# Patient Record
Sex: Female | Born: 1970 | Race: Black or African American | Hispanic: No | Marital: Single
Health system: Southern US, Academic
[De-identification: ages and names within clinical notes are randomized; demographics above are authoritative.]

## PROBLEM LIST (undated history)

## (undated) ENCOUNTER — Telehealth

## (undated) ENCOUNTER — Ambulatory Visit
Payer: MEDICARE | Attending: Student in an Organized Health Care Education/Training Program | Primary: Student in an Organized Health Care Education/Training Program

## (undated) ENCOUNTER — Encounter: Attending: Hematology & Oncology | Primary: Hematology & Oncology

## (undated) ENCOUNTER — Telehealth: Attending: Diagnostic Radiology | Primary: Diagnostic Radiology

## (undated) ENCOUNTER — Telehealth: Attending: Hematology & Oncology | Primary: Hematology & Oncology

## (undated) ENCOUNTER — Ambulatory Visit

## (undated) ENCOUNTER — Encounter
Attending: Student in an Organized Health Care Education/Training Program | Primary: Student in an Organized Health Care Education/Training Program

## (undated) ENCOUNTER — Encounter: Attending: Internal Medicine | Primary: Internal Medicine

## (undated) ENCOUNTER — Inpatient Hospital Stay: Payer: MEDICARE

## (undated) ENCOUNTER — Ambulatory Visit: Payer: BLUE CROSS/BLUE SHIELD | Attending: Diagnostic Radiology | Primary: Diagnostic Radiology

## (undated) ENCOUNTER — Encounter

## (undated) ENCOUNTER — Ambulatory Visit: Payer: BLUE CROSS/BLUE SHIELD | Attending: Hematology & Oncology | Primary: Hematology & Oncology

## (undated) ENCOUNTER — Ambulatory Visit: Payer: MEDICARE

## (undated) ENCOUNTER — Encounter: Payer: BLUE CROSS/BLUE SHIELD | Attending: Hematology & Oncology | Primary: Hematology & Oncology

## (undated) ENCOUNTER — Ambulatory Visit: Payer: BLUE CROSS/BLUE SHIELD

## (undated) ENCOUNTER — Telehealth: Attending: Surgery | Primary: Surgery

## (undated) ENCOUNTER — Ambulatory Visit: Attending: Hematology & Oncology | Primary: Hematology & Oncology

## (undated) ENCOUNTER — Encounter: Attending: Oncology | Primary: Oncology

## (undated) ENCOUNTER — Telehealth
Attending: Student in an Organized Health Care Education/Training Program | Primary: Student in an Organized Health Care Education/Training Program

## (undated) ENCOUNTER — Telehealth
Attending: Pharmacist Clinician (PhC)/ Clinical Pharmacy Specialist | Primary: Pharmacist Clinician (PhC)/ Clinical Pharmacy Specialist

## (undated) ENCOUNTER — Ambulatory Visit: Payer: MEDICARE | Attending: Surgery | Primary: Surgery

## (undated) DIAGNOSIS — I10 Essential (primary) hypertension: Secondary | ICD-10-CM

## (undated) DIAGNOSIS — M1990 Unspecified osteoarthritis, unspecified site: Secondary | ICD-10-CM

## (undated) HISTORY — DX: Essential (primary) hypertension: I10

## (undated) HISTORY — PX: APPENDECTOMY: SHX54

## (undated) HISTORY — DX: Unspecified osteoarthritis, unspecified site: M19.90

## (undated) HISTORY — PX: REPLACEMENT TOTAL KNEE: SUR1224

## (undated) MED ORDER — OXYCODONE 5 MG TABLET: tablet | 0 refills | 0 days | Status: CN

## (undated) MED ORDER — POTASSIUM GLUCONATE 2.5 MEQ TABLET: ORAL | 0 days

---
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
## 2011-07-13 ENCOUNTER — Other Ambulatory Visit (HOSPITAL_COMMUNITY): Payer: Self-pay | Admitting: Family Medicine

## 2011-07-13 DIAGNOSIS — R2230 Localized swelling, mass and lump, unspecified upper limb: Secondary | ICD-10-CM

## 2011-07-21 ENCOUNTER — Ambulatory Visit (HOSPITAL_COMMUNITY)
Admission: RE | Admit: 2011-07-21 | Discharge: 2011-07-21 | Disposition: A | Discharge status: Home / Self Care | Payer: Medicaid Other | Source: Ambulatory Visit | Attending: Family Medicine | Admitting: Family Medicine

## 2011-07-21 ENCOUNTER — Other Ambulatory Visit (HOSPITAL_COMMUNITY): Payer: Self-pay | Admitting: Family Medicine

## 2011-07-21 DIAGNOSIS — N63 Unspecified lump in unspecified breast: Secondary | ICD-10-CM | POA: Insufficient documentation

## 2012-11-27 ENCOUNTER — Other Ambulatory Visit (HOSPITAL_COMMUNITY): Payer: Self-pay | Admitting: Nurse Practitioner

## 2012-11-27 DIAGNOSIS — Z139 Encounter for screening, unspecified: Secondary | ICD-10-CM

## 2012-11-28 ENCOUNTER — Ambulatory Visit (HOSPITAL_COMMUNITY): Payer: Medicaid Other

## 2012-11-30 ENCOUNTER — Ambulatory Visit (HOSPITAL_COMMUNITY): Payer: Medicaid Other

## 2012-12-01 ENCOUNTER — Ambulatory Visit (HOSPITAL_COMMUNITY)
Admission: RE | Admit: 2012-12-01 | Discharge: 2012-12-01 | Disposition: A | Discharge status: Home / Self Care | Payer: Medicaid Other | Source: Ambulatory Visit | Attending: Family Medicine | Admitting: Family Medicine

## 2012-12-01 DIAGNOSIS — Z1231 Encounter for screening mammogram for malignant neoplasm of breast: Secondary | ICD-10-CM | POA: Insufficient documentation

## 2012-12-01 DIAGNOSIS — Z139 Encounter for screening, unspecified: Secondary | ICD-10-CM

## 2014-02-26 ENCOUNTER — Other Ambulatory Visit (HOSPITAL_COMMUNITY): Payer: Self-pay | Admitting: Nurse Practitioner

## 2014-02-26 DIAGNOSIS — Z1231 Encounter for screening mammogram for malignant neoplasm of breast: Secondary | ICD-10-CM

## 2014-03-07 ENCOUNTER — Ambulatory Visit (HOSPITAL_COMMUNITY): Payer: Medicaid Other

## 2014-03-08 ENCOUNTER — Ambulatory Visit (HOSPITAL_COMMUNITY)
Admission: RE | Admit: 2014-03-08 | Discharge: 2014-03-08 | Disposition: A | Discharge status: Home / Self Care | Payer: Medicaid Other | Source: Ambulatory Visit | Attending: Nurse Practitioner | Admitting: Nurse Practitioner

## 2014-03-08 DIAGNOSIS — Z1231 Encounter for screening mammogram for malignant neoplasm of breast: Secondary | ICD-10-CM | POA: Insufficient documentation

## 2015-02-28 ENCOUNTER — Other Ambulatory Visit (HOSPITAL_COMMUNITY): Payer: Self-pay | Admitting: Nurse Practitioner

## 2015-02-28 DIAGNOSIS — Z1231 Encounter for screening mammogram for malignant neoplasm of breast: Secondary | ICD-10-CM

## 2015-03-20 ENCOUNTER — Ambulatory Visit (HOSPITAL_COMMUNITY)
Admission: RE | Admit: 2015-03-20 | Discharge: 2015-03-20 | Disposition: A | Discharge status: Home / Self Care | Payer: Medicaid Other | Source: Ambulatory Visit | Attending: Nurse Practitioner | Admitting: Nurse Practitioner

## 2015-03-20 DIAGNOSIS — Z1231 Encounter for screening mammogram for malignant neoplasm of breast: Secondary | ICD-10-CM

## 2016-04-27 ENCOUNTER — Other Ambulatory Visit (HOSPITAL_COMMUNITY): Payer: Self-pay | Admitting: Nurse Practitioner

## 2016-04-27 DIAGNOSIS — Z1231 Encounter for screening mammogram for malignant neoplasm of breast: Secondary | ICD-10-CM

## 2016-05-06 ENCOUNTER — Encounter (HOSPITAL_COMMUNITY): Payer: Self-pay

## 2016-05-07 ENCOUNTER — Other Ambulatory Visit (HOSPITAL_COMMUNITY): Payer: Self-pay | Admitting: Nurse Practitioner

## 2016-05-07 DIAGNOSIS — Z1231 Encounter for screening mammogram for malignant neoplasm of breast: Secondary | ICD-10-CM

## 2016-05-13 ENCOUNTER — Ambulatory Visit (HOSPITAL_COMMUNITY)
Admission: RE | Admit: 2016-05-13 | Discharge: 2016-05-13 | Disposition: A | Discharge status: Home / Self Care | Payer: Medicaid Other | Source: Ambulatory Visit | Attending: Nurse Practitioner | Admitting: Nurse Practitioner

## 2016-05-13 DIAGNOSIS — Z1231 Encounter for screening mammogram for malignant neoplasm of breast: Secondary | ICD-10-CM

## 2016-06-25 ENCOUNTER — Encounter: Payer: Self-pay | Admitting: *Deleted

## 2016-06-25 NOTE — Progress Notes (Signed)
Dr. Truett PernaSherrill spoke with Dr. Edilia Boobert Esther regarding seeing pt and Dr. Judeth HornEsther's office notified of pt demographics.  Appt set for 06/30/16 at 12:45PM.  Pt notified of date and time of appt.  Pt appreciative of call and informed that Dr. Judeth HornEsther's is to contact her on Monday for further instructions regarding her appt with them.

## 2017-05-10 ENCOUNTER — Ambulatory Visit
Admission: RE | Admit: 2017-05-10 | Discharge: 2017-05-10 | Disposition: A | Discharge status: Home / Self Care | Payer: MEDICAID

## 2017-05-10 ENCOUNTER — Ambulatory Visit
Admission: RE | Admit: 2017-05-10 | Discharge: 2017-05-10 | Disposition: A | Discharge status: Home / Self Care | Payer: MEDICAID | Attending: Hematology & Oncology | Admitting: Hematology & Oncology

## 2017-05-10 DIAGNOSIS — D481 Neoplasm of uncertain behavior of connective and other soft tissue: Principal | ICD-10-CM

## 2017-05-10 DIAGNOSIS — R21 Rash and other nonspecific skin eruption: Secondary | ICD-10-CM

## 2017-05-10 MED ORDER — OXYCODONE 5 MG TABLET
ORAL_TABLET | 0 refills | 0 days | Status: CP
Start: 2017-05-10 — End: 2017-08-26

## 2017-05-10 MED ORDER — UREA 10 % TOPICAL CREAM
0 refills | 0 days | Status: CP
Start: 2017-05-10 — End: 2017-12-27

## 2017-08-26 MED ORDER — OXYCODONE 5 MG TABLET
ORAL_TABLET | 0 refills | 0 days | Status: CP
Start: 2017-08-26 — End: 2017-09-26

## 2017-09-02 MED ORDER — SORAFENIB 200 MG TABLET: 200 mg | tablet | Freq: Every day | 11 refills | 0 days | Status: AC

## 2017-09-02 MED ORDER — SORAFENIB 200 MG TABLET
ORAL_TABLET | Freq: Every day | ORAL | 11 refills | 0.00000 days | Status: CP
Start: 2017-09-02 — End: 2017-09-02

## 2017-09-02 NOTE — Unmapped (Signed)
Escribed sorafenib to [facility] pharmacy to renew benefits investigation. I received a fax from the [employer] program that the patient needed to reapply. Per referrals tab her assistance expired 08/30/17.No active prescription insurance on file at this time. Asked MAPS team to help facilitate renewal (sent email to [facility]).     Leotis Shames, PharmD, BCOP, CPP  Pager [phone]

## 2017-09-26 ENCOUNTER — Ambulatory Visit: Admission: RE | Admit: 2017-09-26 | Discharge: 2017-09-26 | Disposition: A | Discharge status: Home / Self Care

## 2017-09-26 ENCOUNTER — Ambulatory Visit
Admission: RE | Admit: 2017-09-26 | Discharge: 2017-09-26 | Disposition: A | Discharge status: Home / Self Care | Attending: Hematology & Oncology | Admitting: Hematology & Oncology

## 2017-09-26 DIAGNOSIS — G8929 Other chronic pain: Secondary | ICD-10-CM

## 2017-09-26 DIAGNOSIS — M25512 Pain in left shoulder: Secondary | ICD-10-CM

## 2017-09-26 DIAGNOSIS — R21 Rash and other nonspecific skin eruption: Secondary | ICD-10-CM

## 2017-09-26 DIAGNOSIS — D481 Neoplasm of uncertain behavior of connective and other soft tissue: Principal | ICD-10-CM

## 2017-09-26 DIAGNOSIS — R5383 Other fatigue: Secondary | ICD-10-CM

## 2017-09-26 LAB — CBC W/ AUTO DIFF
BASOPHILS ABSOLUTE COUNT: 0 10*9/L (ref 0.0–0.1)
EOSINOPHILS ABSOLUTE COUNT: 0.1 10*9/L (ref 0.0–0.4)
HEMATOCRIT: 37.4 % (ref 36.0–46.0)
HEMOGLOBIN: 12 g/dL (ref 12.0–16.0)
LARGE UNSTAINED CELLS: 1 % (ref 0–4)
MEAN CORPUSCULAR HEMOGLOBIN CONC: 32 g/dL (ref 31.0–37.0)
MEAN CORPUSCULAR VOLUME: 82.5 fL (ref 80.0–100.0)
MEAN PLATELET VOLUME: 9.1 fL (ref 7.0–10.0)
MONOCYTES ABSOLUTE COUNT: 0.3 10*9/L (ref 0.2–0.8)
NEUTROPHILS ABSOLUTE COUNT: 4.1 10*9/L (ref 2.0–7.5)
PLATELET COUNT: 297 10*9/L (ref 150–440)
RED BLOOD CELL COUNT: 4.54 10*12/L (ref 4.00–5.20)
RED CELL DISTRIBUTION WIDTH: 14.9 % (ref 12.0–15.0)
WBC ADJUSTED: 6.9 10*9/L (ref 4.5–11.0)

## 2017-09-26 LAB — BUN / CREAT RATIO: Urea nitrogen/Creatinine:MRto:Pt:Ser/Plas:Qn:: 26

## 2017-09-26 LAB — COMPREHENSIVE METABOLIC PANEL
ALBUMIN: 4 g/dL (ref 3.5–5.0)
ALKALINE PHOSPHATASE: 81 U/L (ref 38–126)
ANION GAP: 14 mmol/L (ref 9–15)
BILIRUBIN TOTAL: 0.6 mg/dL (ref 0.0–1.2)
BLOOD UREA NITROGEN: 16 mg/dL (ref 7–21)
BUN / CREAT RATIO: 26
CALCIUM: 8.7 mg/dL (ref 8.5–10.2)
CHLORIDE: 101 mmol/L (ref 98–107)
CO2: 29 mmol/L (ref 22.0–30.0)
CREATININE: 0.61 mg/dL (ref 0.60–1.00)
EGFR MDRD NON AF AMER: >=60 mL/min/{1.73_m2} (ref >=60)
GLUCOSE RANDOM: 90 mg/dL (ref 65–179)
POTASSIUM: 3.8 mmol/L (ref 3.5–5.0)
PROTEIN TOTAL: 7.8 g/dL (ref 6.5–8.3)
SODIUM: 144 mmol/L (ref 135–145)

## 2017-09-26 LAB — MEAN CORPUSCULAR HEMOGLOBIN CONC: Lab: 32

## 2017-09-26 LAB — SMEAR REVIEW

## 2017-09-26 LAB — SLIDE REVIEW

## 2017-09-26 MED ORDER — OXYCODONE 5 MG TABLET
ORAL_TABLET | 0 refills | 0 days | Status: CP
Start: 2017-09-26 — End: 2017-12-07

## 2017-09-26 NOTE — Unmapped (Signed)
SARCOMA ONCOLOGY FOLLOW UP VISIT    Encounter Date: 09/26/2017  Patient Name: Heidi Woodard  Medical Record Number: [id number]    Referring Physician: [facility] Department  [address]  [address]  [place], [place] [address]    Primary Care Provider: [facility] Department    DIAGNOSIS:  1. Desmoid fibromatosis    2. Fatigue, unspecified type    3. Rash of face    4. 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 Continue prn 1% hydrocort cream to face(OTC), triamcinolone cream to body (refilled), sunscreen or avoidance, and to call if she has recurrent issues. PRN benadryl for itching with rash/flaking skin. Emolliants after bathing (cocoa butter ok).  Cornstarch or baby powder to skin folds to minimize chafing.   - Will try urea based lotion such as udderly smooth if needed on callouses on feet and also along neck.    Desmoid tumor associated pain - refill for oxycodone provided #100 dispensed. SHe can take tylenol along with it, but separated in case she has LFT elevation on sorafenib    HFS - resume prn urea 10% cream to apply to pressure callous on right foot below 5th MTP joint if OTC based urea cream not helping. Praised on wearing orthotic shoes. 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 Understands teratogenic risk of pregnancy on sorafenib.    Dispo: RTC 3 mo with labs and wiith MRI in 6 mo (can do labs following visit), sooner if necessary. Reminded to call if she develops recurrent rash.    I personally reviewed the medical records, pathology and laboratory results and viewed the imaging. All questions were answered to the patient's satisfaction and they voiced understanding and agreement with the plan.      REASON FOR CONSULTATION:   Heidi Woodard is a 46 y.o. female who is seen in consultation at the request of Department, Aaron Edelman * for evaluation of her desmoid fibromatosis.      HISTORY OF PRESENT ILLNESS:    About a year ago, pt developed shoulder pain associated with a fall. There was bruising and swelling but she attributed it to the injury. It never improved, and this summer she noted increased swelling and decreased ROM (which is limited by pain, rather than painless weakness). 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 She underwent CT of the left shoudler on 06/25/16 which showed a 16 cm mass involing the left deltoid with likely invasion of the left trapezius, triceps, and teres minor. Possible LN involvement of axillary and supraclav LN on left were present. On 07/07/2016 she saw Dr. Darral Dash and ultrasound-guided biopsy was performed with pathology revealing low-grade spindle cell lesion favoring fibromatosis. He felt that surgical resection would be highly morbid. On 9/20/017, she saw Dr Beckey Rutter, who did not favor RT due to inherent risks associated with RT.     She has ongoing pain, some tingling along the backs of her hands. Pain is intermittent if her arm  is at rest, happening about every other day but when it happens it is very intense. Per above it limits her ADLs although she says she is managing.      Interval history:  -continues on sorafenib 200mg  daily.   - says she is doing ok. Energy fine.  - has 1 foot and no hand callous stable, and one new callous along her left posterior neck that she just noticed. no other skin findings. No rash.    MRI Personally viewed, shows ongoing nice treatment response.      REVIEW OF SYSTEMS:  A comprehensive review of 14 systems was negative except for pertinent positives noted in HPI.    PMH: denies    PSH: denies    History reviewed. No pertinent family history.     Social History     Occupational History   ??? Not on file.     Social History Main Topics   ??? Smoking status: Never Smoker   ??? Smokeless tobacco: Never Used   ??? Alcohol use No   ??? Drug use: No   ??? Sexual activity: Not on file       ALLERGIES/MEDICATIONS:  Reviewed in EPIC    PHYSICAL EXAM:   Vitals: reviewed per chart.   Gen: comfortable, NAD. ECOG PS 1  HEENT: EOMI, sclera anicteric, MMM.   Neck: Supple, trachea midline  Lymph: No cervical, submandibular, or supraclavicular lymphadenopathy   CV: RRR no murmurs   Lungs: CTAB w/o rales, rhochi, or wheezing, good respiratory effort   Abd: +BS, Soft, NT, ND, no masses appreciated   Ext: Warm and well perfused. No cyanosis, clubbing, or edema bilaterally   Skin: no rash. Skin very dry. Minimal pressure callous under 5th MTP joint on right.  Neuro: Moving all extremities, speech fluid. CNII-XII grossly intact.  Sensation intact.  Psych: mood euthymic and affect appropriate   MSK: + left shoulder decreased ROM with inability to ABduct past horizontal due to pain. Swollen, enlarged, but not tender to palpation.  +++++++++++++++++++++++++++++++++++++++++++++++++++++++++++     PATHOLOGY:   Pathology was personally reviewed as described in the HPI, detailed below.   07/07/2016  Surgical pathology exam  Final Diagnosis   A: Shoulder, left, biopsy   - Low grade spindle cell lesion, favor fibromatosis  - The lesional cells are positive for beta-catenin, weakly positive for smooth muscle actin and desmin, and negative for S100, supporting the interpretation of fibromatosis  - Dr. Pollie Friar concurs  Addendum:  Dr. Morrison Old dictating: The last section of the impression should read:     Multiple mildly prominent left supraclavicular and axillary lymph nodes, metastatic disease cannot be excluded.    LABS:    Lab Results   Component Value Date    WBC 6.9 09/26/2017    HGB 12.0 09/26/2017 HCT 37.4 09/26/2017    MCV 82.5 09/26/2017    PLT 297 09/26/2017     Lab Results   Component Value Date    NEUTROABS 4.1 09/26/2017       Chemistry        Component Value Date/Time    NA 144 09/26/2017 1053    K 3.8 09/26/2017 1053    CL 101 09/26/2017 1053    CO2 29.0 09/26/2017 1053    BUN 16 09/26/2017 1053    CREATININE 0.61 09/26/2017 1053    GLU 90 09/26/2017 1053        Component Value Date/Time    CALCIUM 8.7 09/26/2017 1053    ALKPHOS  81 09/26/2017 1053    AST 31 09/26/2017 1053    ALT 31 09/26/2017 1053    BILITOT 0.6 09/26/2017 1053          Lab Results   Component Value Date    ALT 31 09/26/2017    AST 31 09/26/2017    ALKPHOS 81 09/26/2017    BILITOT 0.6 09/26/2017         RADIOLOGY:  Imaging was personally viewed as summarized in epic

## 2017-09-26 NOTE — Unmapped (Signed)
Lab drawn and sent for analysis, care provided by Eppie Gibson RN

## 2017-10-04 NOTE — Unmapped (Signed)
[facility] Triage Note     Patient: Heidi Woodard     Reason for call:  problem with oxycodone rx    Time call returned: 1520     Phone Assessment: Spoke with [facility] pharmacy. They cannot dispense the oxycodone because Medicaid is requiring a prior auth.    I made pt aware that we will work on obtaining prior auth approval.      Triage Recommendations: n/a     Patient Response: n/a     Outstanding tasks: Please obtain prior authorization for oxycodone. Thanks.      Patient Pharmacy has been verified and primary pharmacy has been marked as preferred

## 2017-10-04 NOTE — Unmapped (Signed)
Patient Heidi Woodard called regarding a prescription for oxycodone written by Dr. Loree Fee on 11/26. [facility] pharmacy is refusing to dispense; patient states that they need to speak with Dr. Loree Fee.    Patient's pharmacy can be reached at [phone].    Patient can be reached at [phone].

## 2017-10-05 NOTE — Unmapped (Signed)
Spoke to pt and made her aware. She says she told [facility] that she paid out of pocket, but that they still required to hear from our office first. Pt all set.

## 2017-10-05 NOTE — Unmapped (Signed)
I have called [facility]. As mentioned in a note by me from April 2018 where we were contacted for a prior authorization, the patient has Medicaid family planning, but does not have prescription coverage through Medicaid. She has been paying cash for her oxycodone prescriptions for some time. I called the [facility] pharmacy and asked them to run the prescription for cash. They confirmed an appropriate refill history and that she has been able to afford the medication.     Leotis Shames, PharmD, BCOP, CPP  Pager [phone]

## 2017-11-15 ENCOUNTER — Other Ambulatory Visit (HOSPITAL_COMMUNITY): Payer: Self-pay | Admitting: *Deleted

## 2017-11-15 DIAGNOSIS — Z1231 Encounter for screening mammogram for malignant neoplasm of breast: Secondary | ICD-10-CM

## 2017-11-18 ENCOUNTER — Ambulatory Visit (HOSPITAL_COMMUNITY)
Admission: RE | Admit: 2017-11-18 | Discharge: 2017-11-18 | Disposition: A | Discharge status: Home / Self Care | Payer: PRIVATE HEALTH INSURANCE | Source: Ambulatory Visit | Attending: *Deleted | Admitting: *Deleted

## 2017-11-18 DIAGNOSIS — Z1231 Encounter for screening mammogram for malignant neoplasm of breast: Secondary | ICD-10-CM | POA: Insufficient documentation

## 2017-12-06 NOTE — Unmapped (Signed)
[facility] Triage Note     Patient: Heidi Woodard     Reason for call:  oxycodone refill    Time call returned: 1543     Phone Assessment: Contacted pt regarding oxycodone refill request. Pt reported she ran out of med. She rated pain in left shoulder as 5/10. She is currently taking one Aleve caplet every 4 hours as needed.     Triage Recommendations: Pt was told that her request for oxycodone refill would be forwarded to her provider.     Patient Response: Pt voiced understanding. She is awaiting oxycodone refill.     Outstanding tasks: Please send oxycodone 5 mg refill to [facility] pharmacy in [place].     Patient Pharmacy has been verified and primary pharmacy has been marked as preferred

## 2017-12-06 NOTE — Unmapped (Signed)
Hi,    Patient Heidi Woodard called requesting a medication refill for the following:    ? Medication: Oxycodone  ? Dosage: 5 mg  Days left of medication: 0    Patient???s pharmacy has been updated/verified in the system.        Thank you,  [name]  [facility] Communication [facility]  [phone]

## 2017-12-07 MED ORDER — OXYCODONE 5 MG TABLET
ORAL_TABLET | 0 refills | 0 days | Status: CP
Start: 2017-12-07 — End: 2018-04-28

## 2017-12-23 NOTE — Unmapped (Signed)
PA request for oxy. Patient has [facility] Medicaid Family Planning so she will need to pay cash. [facility] informed

## 2017-12-27 ENCOUNTER — Other Ambulatory Visit: Admit: 2017-12-27 | Discharge: 2017-12-28

## 2017-12-27 ENCOUNTER — Ambulatory Visit: Admit: 2017-12-27 | Discharge: 2017-12-28 | Attending: Hematology & Oncology | Primary: Hematology & Oncology

## 2017-12-27 DIAGNOSIS — R21 Rash and other nonspecific skin eruption: Secondary | ICD-10-CM

## 2017-12-27 DIAGNOSIS — G8929 Other chronic pain: Secondary | ICD-10-CM

## 2017-12-27 DIAGNOSIS — D481 Neoplasm of uncertain behavior of connective and other soft tissue: Principal | ICD-10-CM

## 2017-12-27 DIAGNOSIS — R5383 Other fatigue: Secondary | ICD-10-CM

## 2017-12-27 DIAGNOSIS — M25512 Pain in left shoulder: Secondary | ICD-10-CM

## 2017-12-27 LAB — WBC ADJUSTED: Lab: 6.9

## 2017-12-27 LAB — COMPREHENSIVE METABOLIC PANEL
ALBUMIN: 4.2 g/dL (ref 3.5–5.0)
ALT (SGPT): 35 U/L (ref 15–48)
ANION GAP: 6 mmol/L — ABNORMAL LOW (ref 9–15)
AST (SGOT): 39 U/L — ABNORMAL HIGH (ref 14–38)
BILIRUBIN TOTAL: 0.5 mg/dL (ref 0.0–1.2)
BLOOD UREA NITROGEN: 20 mg/dL (ref 7–21)
BUN / CREAT RATIO: 31
CALCIUM: 9.4 mg/dL (ref 8.5–10.2)
CHLORIDE: 100 mmol/L (ref 98–107)
CO2: 35 mmol/L — ABNORMAL HIGH (ref 22.0–30.0)
CREATININE: 0.65 mg/dL (ref 0.60–1.00)
EGFR MDRD AF AMER: >=60 mL/min/{1.73_m2} (ref >=60)
GLUCOSE RANDOM: 103 mg/dL (ref 65–179)
POTASSIUM: 3.7 mmol/L (ref 3.5–5.0)
PROTEIN TOTAL: 7.5 g/dL (ref 6.5–8.3)
SODIUM: 141 mmol/L (ref 135–145)

## 2017-12-27 LAB — CBC W/ AUTO DIFF
BASOPHILS ABSOLUTE COUNT: 0 10*9/L (ref 0.0–0.1)
BASOPHILS RELATIVE PERCENT: 0.6 %
EOSINOPHILS ABSOLUTE COUNT: 0.3 10*9/L (ref 0.0–0.4)
EOSINOPHILS RELATIVE PERCENT: 3.7 %
HEMATOCRIT: 39.5 % (ref 36.0–46.0)
HEMOGLOBIN: 12.8 g/dL (ref 12.0–16.0)
LARGE UNSTAINED CELLS: 1 % (ref 0–4)
LYMPHOCYTES ABSOLUTE COUNT: 2.3 10*9/L (ref 1.5–5.0)
LYMPHOCYTES RELATIVE PERCENT: 33.5 %
MEAN CORPUSCULAR HEMOGLOBIN CONC: 32.3 g/dL (ref 31.0–37.0)
MEAN CORPUSCULAR HEMOGLOBIN: 27.7 pg (ref 26.0–34.0)
MEAN PLATELET VOLUME: 9.2 fL (ref 7.0–10.0)
MONOCYTES ABSOLUTE COUNT: 0.3 10*9/L (ref 0.2–0.8)
NEUTROPHILS ABSOLUTE COUNT: 3.9 10*9/L (ref 2.0–7.5)
NEUTROPHILS RELATIVE PERCENT: 56.7 %
PLATELET COUNT: 275 10*9/L (ref 150–440)
RED BLOOD CELL COUNT: 4.62 10*12/L (ref 4.00–5.20)
RED CELL DISTRIBUTION WIDTH: 15.3 % — ABNORMAL HIGH (ref 12.0–15.0)
WBC ADJUSTED: 6.9 10*9/L (ref 4.5–11.0)

## 2017-12-27 LAB — AST (SGOT): Aspartate aminotransferase:CCnc:Pt:Ser/Plas:Qn:: 39 — ABNORMAL HIGH

## 2017-12-27 LAB — SMEAR REVIEW

## 2017-12-27 LAB — THYROID STIMULATING HORMONE: Thyrotropin:ACnc:Pt:Ser/Plas:Qn:: 1.343

## 2017-12-27 MED ORDER — UREA 10 % TOPICAL CREAM
6 refills | 0 days | Status: CP
Start: 2017-12-27 — End: 2018-10-27

## 2017-12-27 MED ORDER — LIDOCAINE-DIPHENHYD-AL-MAG-SIM 200 MG-25 MG-400 MG-40MG/30ML MOUTHWASH
Freq: Four times a day (QID) | OROMUCOSAL | 3 refills | 0 days | Status: CP | PRN
Start: 2017-12-27 — End: ?

## 2017-12-27 NOTE — Unmapped (Signed)
Labs drawn and sent for analysis.  Care provided by  Y Cheek.

## 2017-12-27 NOTE — Unmapped (Signed)
SARCOMA ONCOLOGY FOLLOW UP VISIT    Encounter Date: 12/27/2017  Patient Name: Heidi Woodard  Medical Record Number: [id number]    Referring Physician: [facility] Department  [address]  [address]  [place], [place] [address]    Primary Care Provider: [facility]    DIAGNOSIS:  No diagnosis found.    ASSESSMENT: 47 y.o. female with a large desmoid fibromatosis    RECOMMENDATIONS:  Desmoid tumor:  - On sorafenib (started Jan 2018) due to her symptomatic burden  - ongoing symptomatic improvement so continue sorafenib 200mg  po qhs (dose reduced d/t rash). Continue prn 1% hydrocort cream to face(OTC), triamcinolone cream to body (refilled), sunscreen or avoidance, and to call if she has recurrent issues. PRN benadryl for itching with rash/flaking skin. Emolliants after bathing (cocoa butter ok).  Cornstarch or baby powder to skin folds to minimize chafing.   - Will try urea cream (resent to pharmacy, below) as needed on callouses on feet and also along neck.    Desmoid tumor associated pain - refill for oxycodone provided #100 dispensed. SHe can take tylenol along with it, but separated in case she has LFT elevation on sorafenib    HFS - resume prn urea 10% cream to apply to pressure callous on right foot below 5th MTP joint if OTC based urea cream not helping. Praised on wearing orthotic shoes but needs new ones. 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 Understands teratogenic risk of pregnancy on sorafenib.    Dispo: RTC 3 mo with labs and MRI in 6 mo (can do labs following visit), sooner if necessary. Reminded to call if she develops recurrent rash.    I personally reviewed the medical records, pathology and laboratory results and viewed the imaging. All questions were answered to the patient's satisfaction and they voiced understanding and agreement with the plan.      REASON FOR CONSULTATION:   Heidi Woodard is a 47 y.o. female who is seen in consultation at the request of Department, Aaron Edelman * for evaluation of her desmoid fibromatosis.      HISTORY OF PRESENT ILLNESS:    About a year ago, pt developed shoulder pain associated with a fall. There was bruising and swelling but she attributed it to the injury. It never improved, and this summer she noted increased swelling and decreased ROM (which is limited by pain, rather than painless weakness). 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 She underwent CT of the left shoudler on 06/25/16 which showed a 16 cm mass involing the left deltoid with likely invasion of the left trapezius, triceps, and teres minor. Possible LN involvement of axillary and supraclav LN on left were present. On 07/07/2016 she saw Dr. Darral Dash and ultrasound-guided biopsy was performed with pathology revealing low-grade spindle cell lesion favoring fibromatosis. He felt that surgical resection would be highly morbid. On 9/20/017, she saw Dr Beckey Rutter, who did not favor RT due to inherent risks associated with RT.     She has ongoing pain, some tingling along the backs of her hands. Pain is intermittent if her arm is at rest, happening about every other day but when it happens it is very intense. Per above it limits her ADLs although she  says she is managing.      Interval history:  Continues on sorafenib 200mg  daily.   Ran out of meds x 3 days  - feels like the left posterior shoulder area was hurting more.  Throat has little bumps that are hurting - for the past week. No relief when off the sorafenib. Eating hard food hurts it. No sick contacts. Never had this before.  No new medicines.  Skin -  has has slight callouses in just starting on her hands (3rd fingers) and feet are a little worse.    REVIEW OF SYSTEMS:  A comprehensive review of 14 systems was negative except for pertinent positives noted in HPI.    PMH: denies    PSH: denies    No family history on file.     Social History     Occupational History   ??? Not on file.     Social History Main Topics   ??? Smoking status: Never Smoker   ??? Smokeless tobacco: Never Used   ??? Alcohol use No   ??? Drug use: No   ??? Sexual activity: Not on file       ALLERGIES/MEDICATIONS:  Reviewed in EPIC    PHYSICAL EXAM:   Vitals: reviewed per chart.   Gen: comfortable, NAD. ECOG PS 1  HEENT: EOMI, sclera anicteric, MMM.   Neck: Supple, trachea midline  Lymph: No cervical, submandibular, or supraclavicular lymphadenopathy   CV: RRR no murmurs   Lungs: CTAB w/o rales, rhochi, or wheezing, good respiratory effort   Abd: +BS, Soft, NT, ND, no masses appreciated   Ext: Warm and well perfused. No cyanosis, clubbing, or edema bilaterally   Skin: no rash. Skin very dry. Minimal pressure callous under 5th MTP joint on right.  Neuro: Moving all extremities, speech fluid. CNII-XII grossly intact.  Sensation intact.  Psych: mood euthymic and affect appropriate   MSK: + left shoulder decreased ROM with inability to ABduct past horizontal due to pain. Swollen, enlarged, but not tender to palpation.  +++++++++++++++++++++++++++++++++++++++++++++++++++++++++++     PATHOLOGY:   Pathology was personally reviewed as described in the HPI, detailed below.   07/07/2016  Surgical pathology exam  Final Diagnosis   A: Shoulder, left, biopsy   - Low grade spindle cell lesion, favor fibromatosis  - The lesional cells are positive for beta-catenin, weakly positive for smooth muscle actin and desmin, and negative for S100, supporting the interpretation of fibromatosis  - Dr. Pollie Friar concurs  Addendum:  Dr. Morrison Old dictating: The last section of the impression should read:     Multiple mildly prominent left supraclavicular and axillary lymph nodes, metastatic disease cannot be excluded.    LABS:    Lab Results   Component Value Date    WBC 6.9 12/27/2017 HGB 12.8 12/27/2017    HCT 39.5 12/27/2017    MCV 85.6 12/27/2017    PLT 275 12/27/2017     Lab Results   Component Value Date    NEUTROABS 3.9 12/27/2017       Chemistry        Component Value Date/Time    NA 141 12/27/2017 1034    K 3.7 12/27/2017 1034    CL 100 12/27/2017 1034    CO2 35.0 (H) 12/27/2017 1034    BUN 20 12/27/2017 1034    CREATININE 0.65 12/27/2017 1034    GLU 103 12/27/2017 1034        Component Value Date/Time    CALCIUM 9.4 12/27/2017 1034  ALKPHOS 75 12/27/2017 1034    AST 39 (H) 12/27/2017 1034    ALT 35 12/27/2017 1034    BILITOT 0.5 12/27/2017 1034          Lab Results   Component Value Date    ALT 35 12/27/2017    AST 39 (H) 12/27/2017    ALKPHOS 75 12/27/2017    BILITOT 0.5 12/27/2017         RADIOLOGY:  Imaging was personally viewed as summarized in epic

## 2017-12-28 NOTE — Unmapped (Signed)
+++++++++++++++++++++++++++++++++++++++++++++++++++++++++++    Your nurse navigator is:  [name], RN, BSN, OCN   Head & Neck/Sarcoma  Oncology Nurse Navigator  [email or web address]    For appointments & questions Monday through Friday 8 AM??? 5 PM   please call [phone] or Toll free [phone].    On Nights, Weekends and Holidays  Call [phone] and ask for the adult hematologist-oncologist on call.    [facility]  [address]  [place], [place] [address]  [email or web address]  Tel: [phone] / Toll Free [phone]  Fax: [phone]  +++++++++++++++++++++++++++++++++++++++++++++++++++++++++++

## 2018-02-13 NOTE — Unmapped (Signed)
Patient BICH MCHANEY called requesting a medication refill for the following:    ? Medication: oxyCODONE (ROXICODONE)   ? Dosage: 5 mg  ? Days left of medication: 1    Patient???s pharmacy has been verified as the [facility] listed in the system.      Thank you,  Yolanda Manges.  [facility] Communication [facility]  [phone]

## 2018-02-13 NOTE — Unmapped (Signed)
Attempted to return call, phone picked up and hung up, unable to leave VM. Will pass request to provider.

## 2018-04-12 NOTE — Unmapped (Signed)
Hi,    Patient Heidi Woodard called requesting a medication refill for the following:    ? Medication:oxycodone  ? Dosage: 5 mg  ? Days left of medication: 0    Patient???s pharmacy has been updated/verified in the system.        Thank you,  [name]  [facility] Communication [facility]  [phone]

## 2018-04-12 NOTE — Unmapped (Signed)
[facility] Triage Note     Patient: Heidi Woodard     Reason for call:  medication refill    Time call returned: 1405     Phone Assessment: Spoke with Heidi Woodard, asking for refill for oxycodone.  States she usually takes 2T every other day.  Has none remaining.     Triage Recommendations: message provider   Patient Response: verbalized understanding     Outstanding tasks: refill needed for Heidi Woodard, oxycodone.  Pharmacy up to date in Epic     Patient Pharmacy has been verified and primary pharmacy has been marked as preferred

## 2018-04-25 NOTE — Unmapped (Signed)
[facility] Triage Note     Patient: Heidi Woodard     Reason for call:  medication refill  Time call returned: 1315     Phone Assessment: Spoke with Nicole Cella, asking for refill for Oxycodone.  Has none remaining.  Taking it as needed, reports about 1 every day.   Triage Recommendations: message provider for refill  Patient Response: verbalizes understanding   Outstanding tasks: refill request for oxycocodne, thanks     Patient Pharmacy has been verified and primary pharmacy has been marked as preferred

## 2018-04-25 NOTE — Unmapped (Signed)
Patient JAKIRA MCFADDEN called requesting a medication refill for the following:    ? Medication: oxyCODONE (ROXICODONE)  ? Dosage: 5 mg  ? Days left of medication: 0    Patient???s pharmacy has been verified as the [facility] listed  in the system.      Thank you,  Yolanda Manges.  [facility] Communication Center  [phone]

## 2018-04-28 MED ORDER — OXYCODONE 5 MG TABLET
ORAL_TABLET | 0 refills | 0 days | Status: CP
Start: 2018-04-28 — End: 2018-08-03

## 2018-04-28 NOTE — Unmapped (Signed)
Oxycodone #100 sent to [facility] listed

## 2018-05-15 ENCOUNTER — Other Ambulatory Visit: Admit: 2018-05-15 | Discharge: 2018-05-16 | Payer: BLUE CROSS/BLUE SHIELD

## 2018-05-15 ENCOUNTER — Encounter
Admit: 2018-05-15 | Discharge: 2018-05-16 | Payer: BLUE CROSS/BLUE SHIELD | Attending: Hematology & Oncology | Primary: Hematology & Oncology

## 2018-05-15 ENCOUNTER — Encounter: Admit: 2018-05-15 | Discharge: 2018-05-16 | Payer: BLUE CROSS/BLUE SHIELD

## 2018-05-15 DIAGNOSIS — D481 Neoplasm of uncertain behavior of connective and other soft tissue: Principal | ICD-10-CM

## 2018-05-15 DIAGNOSIS — M25512 Pain in left shoulder: Secondary | ICD-10-CM

## 2018-05-15 DIAGNOSIS — R21 Rash and other nonspecific skin eruption: Secondary | ICD-10-CM

## 2018-05-15 DIAGNOSIS — G8929 Other chronic pain: Secondary | ICD-10-CM

## 2018-05-15 LAB — MEAN PLATELET VOLUME: Lab: 8.9

## 2018-05-15 LAB — CBC W/ AUTO DIFF
BASOPHILS ABSOLUTE COUNT: 0 10*9/L (ref 0.0–0.1)
EOSINOPHILS ABSOLUTE COUNT: 0.2 10*9/L (ref 0.0–0.4)
EOSINOPHILS RELATIVE PERCENT: 2.7 %
HEMATOCRIT: 38.3 % (ref 36.0–46.0)
HEMOGLOBIN: 12.2 g/dL (ref 12.0–16.0)
LARGE UNSTAINED CELLS: 1 % (ref 0–4)
LYMPHOCYTES ABSOLUTE COUNT: 2.9 10*9/L (ref 1.5–5.0)
LYMPHOCYTES RELATIVE PERCENT: 42.4 %
MEAN CORPUSCULAR HEMOGLOBIN CONC: 31.9 g/dL (ref 31.0–37.0)
MEAN CORPUSCULAR HEMOGLOBIN: 27.2 pg (ref 26.0–34.0)
MEAN CORPUSCULAR VOLUME: 85.3 fL (ref 80.0–100.0)
MEAN PLATELET VOLUME: 8.9 fL (ref 7.0–10.0)
MONOCYTES ABSOLUTE COUNT: 0.3 10*9/L (ref 0.2–0.8)
NEUTROPHILS ABSOLUTE COUNT: 3.3 10*9/L (ref 2.0–7.5)
NEUTROPHILS RELATIVE PERCENT: 48.7 %
RED BLOOD CELL COUNT: 4.49 10*12/L (ref 4.00–5.20)
RED CELL DISTRIBUTION WIDTH: 15.1 % — ABNORMAL HIGH (ref 12.0–15.0)
WBC ADJUSTED: 6.8 10*9/L (ref 4.5–11.0)

## 2018-05-15 LAB — COMPREHENSIVE METABOLIC PANEL
ALBUMIN: 4 g/dL (ref 3.5–5.0)
ALKALINE PHOSPHATASE: 71 U/L (ref 38–126)
ALT (SGPT): 28 U/L (ref 15–48)
ANION GAP: 7 mmol/L — ABNORMAL LOW (ref 9–15)
AST (SGOT): 32 U/L (ref 14–38)
BILIRUBIN TOTAL: 0.6 mg/dL (ref 0.0–1.2)
BLOOD UREA NITROGEN: 16 mg/dL (ref 7–21)
BUN / CREAT RATIO: 26
CALCIUM: 8.9 mg/dL (ref 8.5–10.2)
CHLORIDE: 102 mmol/L (ref 98–107)
EGFR CKD-EPI AA FEMALE: >90 mL/min/{1.73_m2} (ref >=60)
EGFR CKD-EPI NON-AA FEMALE: >90 mL/min/{1.73_m2} (ref >=60)
GLUCOSE RANDOM: 87 mg/dL (ref 65–179)
POTASSIUM: 3.4 mmol/L — ABNORMAL LOW (ref 3.5–5.0)
PROTEIN TOTAL: 7.3 g/dL (ref 6.5–8.3)
SODIUM: 138 mmol/L (ref 135–145)

## 2018-05-15 LAB — ALBUMIN: Albumin:MCnc:Pt:Ser/Plas:Qn:: 4

## 2018-05-15 NOTE — Unmapped (Signed)
For the pain, try Salonpas Lidocaine 4% Pain patches and see if they help          For your hands/feet - calloses:    Udderly Smooth Extra Care Cream with 20% Urea,   8 oz, Deeply Moisturizing Lotion for Dry, Chapped, Rough, Cracked Skin, Unscented        ??

## 2018-05-15 NOTE — Unmapped (Signed)
SARCOMA ONCOLOGY FOLLOW UP VISIT    Encounter Date: 05/15/2018  Patient Name: Heidi Woodard  Medical Record Number: [id number]    Referring Physician: Referred Self  No address on file    Primary Care Provider: [facility]    DIAGNOSIS:  1. Desmoid fibromatosis    2. Rash of face    3. 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 Continue prn 1% hydrocort cream to face(OTC), triamcinolone cream to body, sunscreen or avoidance, and to call if she has recurrent issues. PRN benadryl for itching with rash/flaking skin. Emolliants after bathing (cocoa butter ok).  Cornstarch or baby powder to skin folds to minimize chafing.   - Continue urea cream as needed on callouses on feet and also along neck.    Desmoid tumor associated pain - refill for oxycodone provided #100 dispensed on 04/28/18, knows to provide ample time for refill by phone, only refilling every several months. 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 Praised on wearing orthotic shoes but needs new ones. 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 Continue aggressive emollient use to prevent dry skin.    Birth control: Says she is not sexually active. Understands teratogenic risk of pregnancy on sorafenib.    Dispo: RTC 3 mo with labs and MRI in 6 mo, sooner if necessary. Reminded to call if she develops recurrent rash.    I personally reviewed the medical records, pathology and laboratory results and viewed the imaging. All questions were answered to the patient's satisfaction and they voiced understanding and agreement with the plan.      REASON FOR CONSULTATION:   Heidi Woodard is a 47 y.o. female who is seen in follow up for her desmoid fibromatosis.      HISTORY OF PRESENT ILLNESS:    About a year ago, pt developed shoulder pain associated with a fall. There was bruising and swelling but she attributed it to the injury. It never improved, and this summer she noted increased swelling and decreased ROM (which is limited by pain, rather than painless weakness). 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 She underwent CT of the left shoudler on 06/25/16 which showed a 16 cm mass involing the left deltoid with likely invasion of the left trapezius, triceps, and teres minor. Possible LN involvement of axillary and supraclav LN on left were present. On 07/07/2016 she saw Dr. Darral Dash and ultrasound-guided biopsy was performed with pathology revealing low-grade spindle cell lesion favoring fibromatosis. He felt that surgical resection would be highly morbid. On 9/20/017, she saw Dr Beckey Rutter, who did not favor RT due to inherent risks associated with RT.     She has ongoing pain, some  tingling along the backs of her hands. Pain is intermittent if her arm is at rest, happening about every other day but when it happens it is very intense. Per above it limits her ADLs although she says she is managing.      Interval history: Today (A history was conducted today with the same results as the history from my last visit, with differences noted below):    Continues on sorafenib 200mg  daily.   No new medicines.  Skin -  has has slight callouses on her hands (3rd fingers) and feet similar. No dry skin or rashes.  No N/V/D/C    Past week left shoulder has been more sore - no new activities      REVIEW OF SYSTEMS:  A comprehensive review of 14 systems was negative except for pertinent positives noted in HPI.    PMH: denies    PSH: denies    History reviewed. No pertinent family history.     Social History     Occupational History   ??? Not on file   Tobacco Use   ??? Smoking status: Never Smoker   ??? Smokeless tobacco: Never Used   Substance and Sexual Activity   ??? Alcohol use: No   ??? Drug use: No   ??? Sexual activity: Not on file       ALLERGIES/MEDICATIONS:  Reviewed in EPIC    PHYSICAL EXAM:   Vitals: reviewed per chart.   Gen: comfortable, NAD. ECOG PS 1  HEENT: EOMI, sclera anicteric, MMM.   Neck: Supple, trachea midline  Lymph: No cervical, submandibular, or supraclavicular lymphadenopathy   CV: RRR no murmurs   Lungs: CTAB w/o rales, rhochi, or wheezing, good respiratory effort   Abd: +BS, Soft, NT, ND, no masses appreciated   Ext: Warm and well perfused. No cyanosis, clubbing, or edema bilaterally   Skin: no rash. Skin very dry. Minimal pressure callous under 5th MTP joint on right.  Neuro: Moving all extremities, speech fluid. CNII-XII grossly intact.  Sensation intact.  Psych: mood euthymic and affect appropriate   MSK: + left shoulder decreased ROM with inability to ABduct past horizontal due to pain. Swollen, enlarged, but not tender to palpation.  +++++++++++++++++++++++++++++++++++++++++++++++++++++++++++     PATHOLOGY:   Pathology was personally reviewed as described in the HPI, detailed below.   07/07/2016  Surgical pathology exam  Final Diagnosis   A: Shoulder, left, biopsy   - Low grade spindle cell lesion, favor fibromatosis  - The lesional cells are positive for beta-catenin, weakly positive for smooth muscle actin and desmin, and negative for S100, supporting the interpretation of fibromatosis  - Dr. Pollie Friar concurs  Addendum:  Dr. Morrison Old dictating: The last section of the impression should read:     Multiple mildly prominent left supraclavicular and axillary lymph nodes, metastatic disease cannot be excluded.    LABS:    Lab Results   Component Value Date    WBC 6.8 05/15/2018    HGB 12.2 05/15/2018    HCT 38.3 05/15/2018    MCV 85.3 05/15/2018    PLT 265 05/15/2018     Lab Results   Component Value Date    NEUTROABS 3.3 05/15/2018       Chemistry        Component Value Date/Time    NA 141 12/27/2017 1034    K 3.7 12/27/2017 1034    CL 100 12/27/2017 1034    CO2 35.0 (H) 12/27/2017 1034    BUN 20 12/27/2017  1034    CREATININE 0.65 12/27/2017 1034    GLU 103 12/27/2017 1034        Component Value Date/Time    CALCIUM 9.4 12/27/2017 1034    ALKPHOS 75 12/27/2017 1034    AST 39 (H) 12/27/2017 1034    ALT 35 12/27/2017 1034    BILITOT 0.5 12/27/2017 1034          Lab Results   Component Value Date    ALT 35 12/27/2017    AST 39 (H) 12/27/2017    ALKPHOS 75 12/27/2017    BILITOT 0.5 12/27/2017         RADIOLOGY:  Imaging was personally viewed as summarized in epic

## 2018-05-15 NOTE — Unmapped (Signed)
1212:  Labs drawn and sent for analysis.  Care provided by  Eppie Gibson, RN

## 2018-08-03 MED ORDER — OXYCODONE 5 MG TABLET
ORAL_TABLET | 0 refills | 0 days | Status: CP
Start: 2018-08-03 — End: 2018-11-24

## 2018-08-03 NOTE — Unmapped (Signed)
Refilled oxycodone #100. Prior prescription was 04/28/18

## 2018-08-03 NOTE — Unmapped (Signed)
Hi,    Patient HAE AHLERS called requesting a medication refill for the following:    ? Medication: Oxy Codone  ? Dosage: 5 Mg  ? Days left of medication: 0  ? Pharmacy: [facility]     The expected turnaround time is 3-4 business days     Thank you,  [name]  [facility] Communication [facility]  [phone]

## 2018-09-05 NOTE — Unmapped (Signed)
Hi,    Patient AINSLEE SOU called requesting a medication refill for the following:    ? Medication: Roxicodone  ? Dosage: 5 MG  ? Days left of medication: 1  ? Pharmacy: [facility]    Program: Sarcoma  Speciality: Surgery      The expected turnaround time is 3-4 business days     Thank you,  Drema Balzarine  [facility] Communication Center  [phone]

## 2018-09-07 NOTE — Unmapped (Signed)
Received refill request from Ms. Weins and was asked to address since Dr. Loree Fee is out of the office. Reveiwed PDMP database showing that she received 100 tablets of oxycodone 5mg  on 08/03/2018. Per Dr. Yves Dill note and the patients refill history this should last her at least 2 months. Therefore, I do not feel the refill is appropriate at this time. I would need to discuss with the patient why she is using more pain medication than normal.     I called both phone numbers listed  In Epic for Ms. Beaudry but both numbers were disconnected. I will try these numbers again tomorrow.     Leotis Shames, PharmD, BCOP, CPP  Pager [phone]

## 2018-09-13 MED ORDER — SORAFENIB 200 MG TABLET
Freq: Every day | ORAL | 11 refills | 0 days
Start: 2018-09-13 — End: 2019-09-13

## 2018-09-13 NOTE — Unmapped (Signed)
Received a message that patient is due for re-enrollment for sorafenib patient assistance and requires a new prescription. I called in a new prescription to Nexavar Assistance Program [phone] and left a detailed prescription on the pharmacists voicemail as directed by the phone tree.    Current dose: sorafenib 200mg  PO once daily.     FOLLOW UP: 10/02/2018 with Dr. Susanne Borders, PharmD, BCOP, CPP  Pager [phone]

## 2018-10-02 NOTE — Unmapped (Signed)
Hi Dr. Loree Fee,    Patient Heidi Woodard contacted the Communication Center to cancel their appointment for today.  The appointment has been cancelled.    Cancellation Reason: Sick    Patient rescheduled for 11/14/18    Thank you,  Kelli Hope  [facility] Communication Center   [phone]

## 2018-10-05 NOTE — Unmapped (Signed)
Hi,    Patient Heidi Woodard called requesting a medication refill for the following:    ? Medication: oxycodone  ? Dosage: 5mg   ? Days left of medication: 0  ? Pharmacy: [facility]    Program: [facility]coma  Speciality: Medical Oncology     The expected turnaround time is 3-4 business days       Check Indicates criteria has been reviewed and confirmed with the patient:    [x]  Preferred Name   [x]  DOB and/or MR#  [x]  Preferred Contact Method  [x]  Phone Number(s)   [x]  Preferred Pharmacy   []  MyChart     Thank you,  Jacques Navy  [facility] Communication Center  [phone]

## 2018-10-07 NOTE — Unmapped (Signed)
Reviewed narcotic database after pt cancelled/rescheduled appt but requested narcotic refill. Much sooner than prior. Review by Alma Downs D is that they have been having hard time reaching her for sorafenib refill also.    She needs to be seen prior to any further opiate refills    Dawson Bills, M.D.  Attending Physician  Associate Professor of Medicine  [facility] [facility] [place]  Division of Hematology Oncology  Multidisciplinary Sarcoma and Head&Neck Programs  [employer]  [address], [address]  [place], [place] [address]

## 2018-10-09 NOTE — Unmapped (Signed)
Called Ms. Schillaci to check on her. She didn't make her last appointment and she had a recent request for pain meds. I asked her if she would like me to get her in sooner than the appointment she already has scheduled, and she agreed. She confirmed the best number to reach her is [phone]. Will request lab/MD appointment to next available with [facility].

## 2018-10-11 NOTE — Unmapped (Signed)
Hi,    Patient Heidi Woodard called requesting a medication refill for the following:    ? Medication: oxycodone  ? Dosage: 5mg    ? Days left of medication: 0   ? Pharmacy: [facility]    Program: Sarcoma/Head and Neck   Speciality: Medical Oncology     The expected turnaround time is 3-4 business days       Check Indicates criteria has been reviewed and confirmed with the patient:    [x]  Preferred Name   [x]  DOB and/or MR#  [x]  Preferred Contact Method  [x]  Phone Number(s)   [x]  Preferred Pharmacy   []  MyChart     Thank you,  [name]  [facility] Communication Center  [phone]

## 2018-10-11 NOTE — Unmapped (Signed)
Message sent to Dr. Loree Fee to request for narcotic refill.

## 2018-10-12 NOTE — Unmapped (Signed)
Left verbal message with patient's sister who is responsible for pt's transportation about appt on 12/16. Stated she will call back to confirm 12/16 appt with JGO.

## 2018-10-16 ENCOUNTER — Ambulatory Visit: Admit: 2018-10-16 | Discharge: 2018-10-17 | Attending: Hematology & Oncology | Primary: Hematology & Oncology

## 2018-10-16 ENCOUNTER — Other Ambulatory Visit: Admit: 2018-10-16 | Discharge: 2018-10-17

## 2018-10-16 DIAGNOSIS — D481 Neoplasm of uncertain behavior of connective and other soft tissue: Principal | ICD-10-CM

## 2018-10-16 DIAGNOSIS — R21 Rash and other nonspecific skin eruption: Secondary | ICD-10-CM

## 2018-10-16 DIAGNOSIS — G8929 Other chronic pain: Secondary | ICD-10-CM

## 2018-10-16 DIAGNOSIS — L98491 Non-pressure chronic ulcer of skin of other sites limited to breakdown of skin: Secondary | ICD-10-CM

## 2018-10-16 DIAGNOSIS — M25512 Pain in left shoulder: Secondary | ICD-10-CM

## 2018-10-16 LAB — COMPREHENSIVE METABOLIC PANEL
ALBUMIN: 4.2 g/dL (ref 3.5–5.0)
ALKALINE PHOSPHATASE: 84 U/L (ref 38–126)
ALT (SGPT): 23 U/L (ref <35)
ANION GAP: 10 mmol/L (ref 7–15)
AST (SGOT): 39 U/L — ABNORMAL HIGH (ref 14–38)
BILIRUBIN TOTAL: 0.5 mg/dL (ref 0.0–1.2)
BLOOD UREA NITROGEN: 18 mg/dL (ref 7–21)
BUN / CREAT RATIO: 26
CALCIUM: 9.1 mg/dL (ref 8.5–10.2)
CHLORIDE: 101 mmol/L (ref 98–107)
CO2: 29 mmol/L (ref 22.0–30.0)
EGFR CKD-EPI AA FEMALE: >90 mL/min/{1.73_m2} (ref >=60)
EGFR CKD-EPI NON-AA FEMALE: >90 mL/min/{1.73_m2} (ref >=60)
GLUCOSE RANDOM: 92 mg/dL (ref 65–179)
POTASSIUM: 3.9 mmol/L (ref 3.5–5.0)
PROTEIN TOTAL: 8.1 g/dL (ref 6.5–8.3)
SODIUM: 140 mmol/L (ref 135–145)

## 2018-10-16 LAB — CBC W/ AUTO DIFF
BASOPHILS ABSOLUTE COUNT: 0 10*9/L (ref 0.0–0.1)
BASOPHILS RELATIVE PERCENT: 0.4 %
EOSINOPHILS ABSOLUTE COUNT: 0.5 10*9/L — ABNORMAL HIGH (ref 0.0–0.4)
EOSINOPHILS RELATIVE PERCENT: 5.5 %
HEMATOCRIT: 39.3 % (ref 36.0–46.0)
HEMOGLOBIN: 12.9 g/dL (ref 12.0–16.0)
LYMPHOCYTES ABSOLUTE COUNT: 3.5 10*9/L (ref 1.5–5.0)
LYMPHOCYTES RELATIVE PERCENT: 39.9 %
MEAN CORPUSCULAR HEMOGLOBIN CONC: 32.8 g/dL (ref 31.0–37.0)
MEAN CORPUSCULAR VOLUME: 83.9 fL (ref 80.0–100.0)
MEAN PLATELET VOLUME: 9.3 fL (ref 7.0–10.0)
MONOCYTES ABSOLUTE COUNT: 0.3 10*9/L (ref 0.2–0.8)
MONOCYTES RELATIVE PERCENT: 3.8 %
NEUTROPHILS ABSOLUTE COUNT: 4.3 10*9/L (ref 2.0–7.5)
NEUTROPHILS RELATIVE PERCENT: 49.2 %
PLATELET COUNT: 283 10*9/L (ref 150–440)
RED BLOOD CELL COUNT: 4.69 10*12/L (ref 4.00–5.20)
RED CELL DISTRIBUTION WIDTH: 15.8 % — ABNORMAL HIGH (ref 12.0–15.0)
WBC ADJUSTED: 8.6 10*9/L (ref 4.5–11.0)

## 2018-10-16 LAB — ALBUMIN: Albumin:MCnc:Pt:Ser/Plas:Qn:: 4.2

## 2018-10-16 LAB — MEAN CORPUSCULAR HEMOGLOBIN: Lab: 27.6

## 2018-10-16 NOTE — Unmapped (Signed)
Labs drawn and sent for analysis. Care provided by T.Fallecker RN

## 2018-10-16 NOTE — Unmapped (Signed)
SARCOMA ONCOLOGY FOLLOW UP VISIT    Encounter Date: 10/16/2018  Patient Name: Heidi Woodard  Medical Record Number: [id number]    Referring Physician: Dr Edilia Bo, Ortho onc  Primary Care Provider: [facility] Department    DIAGNOSIS: large desmoid fibromatosis    ASSESSMENT: 47 y.o. female with a large desmoid fibromatosis    RECOMMENDATIONS: As of today, continue same plan as below with any changes noted:    Desmoid tumor:  - On sorafenib (started Jan 2018) due to her symptomatic burden  - ongoing symptomatic and radiographic improvement so continue sorafenib 200mg  po qhs (dose reduced d/t rash). Continue prn 1% hydrocort cream to face(OTC), triamcinolone cream to body, sunscreen or avoidance, and to call if she has recurrent issues. PRN benadryl for itching with rash/flaking skin. Emolliants after bathing (cocoa butter ok).  Cornstarch or baby powder to skin folds to minimize chafing.   - Continue urea cream as needed on callouses on feet and also along neck.  - Pt states she has 100% compliance. Per pharmacy - they have been unable to reach her, as has her manufacturer assistance, since her last refill 11/20. Her phone has been disconnected. I became aware of this after she left.    Desmoid tumor associated pain -  - Historically she has only refilling every several months. She can take tylenol along with it, but separated in case she has LFT elevation on sorafenib  - It was reordered 08/03/18 #100 tab by phone. She contacted [place] several times for refills through out November/beginning December which is unusually soon for her considering her tumor volume is overall similar. She had been instructed she needed to be seen prior to a refill. However today she says Oh I just picked the oxycodone up earlier before the appointment which does not make sense since I did not refill prior to the appt.    - consider referral to pall care for tumor pain management with appropriate education with contract and [facility] as appropriate.  - trial of OTC lidoderm patches - not addressed today - suspect she may not be able to afford.    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 Praised on wearing orthotic shoes but needs new ones. 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 Continue aggressive emollient use to prevent dry skin.    Birth control: Says she is not sexually active. Understands teratogenic risk of pregnancy on sorafenib.    Dispo: RTC w/ MRI in 2-3 mo, sooner if necessary. Reminded to call if she develops recurrent rash.    I personally reviewed the medical records, pathology and laboratory results and viewed the imaging. All questions were answered to the patient's satisfaction and they voiced understanding and agreement with the plan.      REASON FOR CONSULTATION:   Heidi Woodard is a 47 y.o. female who is seen in follow up for her desmoid fibromatosis.      HISTORY OF PRESENT ILLNESS:    About a year ago, pt developed shoulder pain associated with a fall. There was bruising and swelling but she attributed it to the injury. It never improved, and this summer she noted increased swelling and decreased ROM (which is limited by pain, rather than painless weakness). 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 It also affects her IADLs and ADLs such as dressing/bathing. She underwent CT of the left shoudler on 06/25/16 which showed a 16 cm mass involing the left deltoid with likely invasion of the left trapezius, triceps, and teres minor. Possible LN involvement of axillary and supraclav LN on left were present. On 07/07/2016 she saw Dr. Darral Dash and ultrasound-guided biopsy was performed with pathology revealing low-grade spindle cell lesion favoring fibromatosis. He felt that surgical resection would be highly morbid. On 9/20/017, she saw Dr Beckey Rutter, who did not favor RT due to inherent risks associated with RT.     She has ongoing pain, some tingling along the backs of her hands. Pain is intermittent if her arm is at rest, happening about every other day but when it happens it is very intense. Per above it limits her ADLs although she says she is managing.      Interval history: Today (A history was conducted today with the same results as the history from my last visit, with differences noted below):    Continues on sorafenib 200mg  daily. Says she has not missed any doses.  No new medicines.  Skin -  has has slight callouses on her hands (3rd fingers) and feet similar. No dry skin or rashes.   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    PMH: denies    PSH: denies    History reviewed. No pertinent family history.     Social History     Occupational History   ??? Not on file   Tobacco Use   ??? Smoking status: Never Smoker   ??? Smokeless tobacco: Never Used   Substance and Sexual Activity   ??? Alcohol use: No   ??? Drug use: No   ??? Sexual activity: Not on file       ALLERGIES/MEDICATIONS:  Reviewed in EPIC    PHYSICAL EXAM:   Vitals: reviewed per chart.   Gen: comfortable, NAD. ECOG PS 1  HEENT: EOMI, sclera anicteric, MMM.   Neck: Supple, trachea midline  Lymph: No cervical, submandibular, or supraclavicular lymphadenopathy   CV: RRR no murmurs   Lungs: CTAB w/o rales, rhochi, or wheezing, good respiratory effort   Abd: +BS, Soft, NT, ND, no masses appreciated   Ext: Warm and well perfused. No cyanosis, clubbing, or edema bilaterally   Skin: no rash. Skin very dry. Minimal pressure callous under 5th MTP joint on right.  Neuro: Moving all extremities, speech fluid. CNII-XII grossly intact.  Sensation intact.  Psych: mood euthymic and affect appropriate   MSK: + left shoulder decreased ROM with inability to ABduct past horizontal due to pain. Swollen, stabily enlarged, but not tender to palpation.  +++++++++++++++++++++++++++++++++++++++++++++++++++++++++++     PATHOLOGY:   Pathology was personally reviewed as described in the HPI, detailed below.   07/07/2016  Surgical pathology exam  Final Diagnosis   A: Shoulder, left, biopsy   - Low grade spindle cell lesion, favor fibromatosis  - The lesional cells are positive for beta-catenin, weakly positive for smooth muscle actin and desmin, and negative for S100, supporting the interpretation of fibromatosis  - Dr. Pollie Friar concurs  Addendum:  Dr. Morrison Old dictating: The last section of the impression should read:  Multiple mildly prominent left supraclavicular and axillary lymph nodes, metastatic disease cannot be excluded.    LABS:    Lab Results   Component Value Date    WBC 8.6 10/16/2018    HGB 12.9 10/16/2018    HCT 39.3 10/16/2018    MCV 83.9 10/16/2018    PLT 283 10/16/2018     Lab Results   Component Value Date    NEUTROABS 4.3 10/16/2018       Chemistry        Component Value Date/Time    NA 140 10/16/2018 1337    K 3.9 10/16/2018 1337    CL 101 10/16/2018 1337    CO2 29.0 10/16/2018 1337    BUN 18 10/16/2018 1337    CREATININE 0.70 10/16/2018 1337    GLU 92 10/16/2018 1337        Component Value Date/Time    CALCIUM 9.1 10/16/2018 1337    ALKPHOS 84 10/16/2018 1337    AST 39 (H) 10/16/2018 1337    ALT 23 10/16/2018 1337    BILITOT 0.5 10/16/2018 1337          Lab Results   Component Value Date    ALT 23 10/16/2018    AST 39 (H) 10/16/2018    ALKPHOS 84 10/16/2018    BILITOT 0.5 10/16/2018         RADIOLOGY:  Imaging was personally viewed as summarized in epic

## 2018-10-16 NOTE — Unmapped (Signed)
I received an in basket message on 09/04/2018 that [facility] was having trouble reaching [name] to coordinate delivery of her sorafenib. When I tried to call her the phone number was disconnected. Patient was seen in clinic today and I was asked to call [facility] to assess adherence to sorafenib.     I called [facility] at [phone] to obtain a refill history. The representative I spoke to told me that the patient's benefit expired as of 09/16/2018 and she needed to reapply for the program. She had been refilling her medication monthly since April 2019, with the most recent refill processed on 09/20/2018.     Patient had left by the time I had this information, but relayed to Dr. Loree Fee. I will send an inbasket message to our MAP team to facilitate re-enrollment in the assistance program.     Leotis Shames, PharmD, BCOP, CPP  Pager [phone]

## 2018-10-27 MED ORDER — UREA 10 % TOPICAL CREAM
0 refills | 0 days | Status: CP
Start: 2018-10-27 — End: 2019-02-05

## 2018-10-27 NOTE — Unmapped (Signed)
Contacted patient to follow up on triage calls regarding medication refill and receipt of faxes. Reviewed proper use and refilled requested medication. Unable to confirm receipt of faxes for Nexavar approval, but gave patient my office fax number. Will relay information to care team.

## 2018-10-27 NOTE — Unmapped (Signed)
Hi,    Patient Heidi Woodard called requesting a medication refill for the following:    ? Medication: Carmol   ? Dosage: 10% cream  ? Days left of medication: 0  ? Pharmacy: [facility]    Program: [facility]coma  Speciality: Medical Oncology     The expected turnaround time is 3-4 business days       Check Indicates criteria has been reviewed and confirmed with the patient:    [x]  Preferred Name   [x]  DOB and/or MR#  [x]  Preferred Contact Method  [x]  Phone Number(s)   [x]  Preferred Pharmacy   []  MyChart     Thank you,  Kelli Hope  [facility]  [phone]

## 2018-10-27 NOTE — Unmapped (Signed)
Hi,     Prisma Decarlo contacted the Communication Center requesting to speak with the care team of [name] to discuss:    Patient faxed check stubs last night to Dr. Loree Fee. Information being used for Nexavar approval. Checking to make sure fax was received.     Please contact [name] at [phone].    Program: Sarcoma  Speciality: Medical Oncology    Check Indicates criteria has been reviewed and confirmed with the patient:    [x]  Preferred Name   [x]  DOB and/or MR#  [x]  Preferred Contact Method  [x]  Phone Number(s)   []  MyChart     Thank you,   Kelli Hope  [facility]   [phone]

## 2018-10-28 NOTE — Unmapped (Signed)
Hi,     Heidi Woodard contacted the Communication Center requesting to speak with the care team of Heidi Woodard to discuss:    Trying to follow up on check stubs.    Please contact [name] at [phone].    Program: Sarcoma  Speciality: Medical Oncology    Check Indicates criteria has been reviewed and confirmed with the patient:    [x]  Preferred Name   [x]  DOB and/or MR#  [x]  Preferred Contact Method  [x]  Phone Number(s)   []  MyChart     Thank you,   Kelli Hope  [place] [facility]   [phone]

## 2018-10-30 NOTE — Unmapped (Signed)
Hi,   ??  Tressie Ragin contacted the [employer] requesting to speak with the care team of CHRISTA FASIG to discuss:  ??  Trying to follow up on check stubs.  ??  Please contact [name] at [phone].  ??  Program: [facility]coma  Speciality: Medical Oncology  ??  Check Indicates criteria has been reviewed and confirmed with the patient:  ??  [x] ? Preferred Name   [x] ? DOB and/or MR#  [x] ? Preferred Contact Method  [x] ? Phone Number(s)   [] ? MyChart   ??  Thank you,   Samul Dada  [facility] Communication Center   [phone]

## 2018-10-30 NOTE — Unmapped (Signed)
Hi,     Patient contacted the Communication Center requesting to speak with the care team of Heidi Woodard to discuss:    Requesting a call back from [name] to confirm that her check- stubs were received.     Please contact patient at [phone].    Program: [facility]  Speciality: Medical Oncology    Check Indicates criteria has been reviewed and confirmed with the patient:    [x]  Preferred Name   [x]  DOB and/or MR#  [x]  Preferred Contact Method  [x]  Phone Number(s)   []  MyChart     Thank you,   [name]  [facility] Communication Center   [phone]

## 2018-10-31 NOTE — Unmapped (Signed)
Spoke with patient. Rec'd the application, but the pay stubs, while included, are illegible/ Patient will refax them. Will forward application to appropriate person.

## 2018-10-31 NOTE — Unmapped (Signed)
Hi,     Shizue contacted the [employer] requesting to speak with the care team of FEMALE IAFRATE to discuss:    Patient called to speak to Dr. Loree Fee about paper work for chemo medicine.    Please contact Rolando at [phone].    Program: [facility]  Speciality: Medical Oncology    Check Indicates criteria has been reviewed and confirmed with the patient:    [x]  Preferred Name   [x]  DOB and/or MR#  [x]  Preferred Contact Method  [x]  Phone Number(s)   []  MyChart     Thank you,   Jacques Navy  [facility] Cancer Communication Center   [phone]

## 2018-11-21 NOTE — Unmapped (Signed)
AOC Triage Note     Patient: Heidi Woodard     Reason for call: Oxycodone refill    Time call returned: 1126     Phone Assessment: Would like refill on Oxycodone 5mg , to [facility] pharmacy in chart.      Triage Recommendations: Will reach out to Heidi Woodard for refill. Notified Heidi Woodard that I cannot guarantee that the refill will be complete by the end of the day.      Patient Response: Satisfied with plan     Outstanding tasks: Care team notification no further actions needed      Patient Pharmacy has been verified and primary pharmacy has been marked as preferred

## 2018-11-21 NOTE — Unmapped (Signed)
Hi,    Patient Heidi Woodard called requesting a medication refill for the following:    ? Medication: Oxycodone  ? Dosage: 5mg   ? Days left of medication: 4  ? Pharmacy: [facility]    Program: [facility]  Speciality: Medical Oncology     The expected turnaround time is 3-4 business days       Check Indicates criteria has been reviewed and confirmed with the patient:    [x]  Preferred Name   [x]  DOB and/or MR#  [x]  Preferred Contact Method  [x]  Phone Number(s)   [x]  Preferred Pharmacy   []  MyChart     Thank you,  Jacques Navy  [facility] Cancer Communication Center  [phone]

## 2018-11-23 NOTE — Unmapped (Signed)
Hi,     Yeslin contacted the [employer] requesting to speak with the care team of Heidi Woodard to discuss:    Following up on call from 1/21 about oxycodone refill.  She has 2 days left.    Please contact at [phone].    Program: [facility]  Speciality: Medical Oncology    Check Indicates criteria has been reviewed and confirmed with the patient:    []  Preferred Name   [x]  DOB and/or MR#  [x]  Preferred Contact Method  [x]  Phone Number(s)   []  MyChart     Thank you,   [name]  [facility] Cancer Communication Center   [phone]

## 2018-11-24 MED ORDER — OXYCODONE 5 MG TABLET
ORAL_TABLET | 0 refills | 0 days | Status: CP
Start: 2018-11-24 — End: 2018-12-28

## 2018-11-24 NOTE — Unmapped (Signed)
Hi,    Patient Heidi Woodard called requesting a medication refill for the following:    ? Medication: Oxycodone  ? Dosage: 5mg   ? Days left of medication: 0  ? Pharmacy: [facility]    Program: [facility]  Speciality: Medical Oncology     The expected turnaround time is 3-4 business days       Check Indicates criteria has been reviewed and confirmed with the patient:    [x]  Preferred Name   [x]  DOB and/or MR#  [x]  Preferred Contact Method  [x]  Phone Number(s)   [x]  Preferred Pharmacy   []  MyChart     Thank you,  Jacques Navy  [facility] Cancer Communication Center  [phone]

## 2018-11-24 NOTE — Unmapped (Signed)
[facility] Triage Note     Patient: Heidi Woodard     Reason for call:  Oxycodone update    Time call returned: 1603     Phone Assessment: Debara was wondering if her oxycodone was filled. I spoke with her Tuesday and sent the request to Dr Loree Fee.      Triage Recommendations: I let her know Dr Loree Fee has the request and is working on it. I told her it can take a few days to get prescriptions refilled.     Patient Response: Satisfied with response.     Outstanding tasks: Care team notification no further actions needed      Patient Pharmacy has been verified and primary pharmacy has been marked as preferred

## 2018-11-25 NOTE — Unmapped (Signed)
Oxycodone 5mg  #100 refilled. PDMP checked by Kennon Holter (onc pharmacist) and I reviewed.

## 2018-12-26 NOTE — Unmapped (Signed)
Hi,    Patient Heidi Woodard called requesting a medication refill for the following:    ? Medication: Oxycodone  ? Dosage: 5mg   ? Days left of medication: 0  ? Pharmacy: [facility]    Program: [facility]  Speciality: Medical Oncology     The expected turnaround time is 3-4 business days       Check Indicates criteria has been reviewed and confirmed with the patient:    [x]  Preferred Name   [x]  DOB and/or MR#  [x]  Preferred Contact Method  [x]  Phone Number(s)   [x]  Preferred Pharmacy   []  MyChart     Thank you,  Jacques Navy  [facility] Cancer Communication Center  [phone]

## 2018-12-27 NOTE — Unmapped (Signed)
Hi,    Patient Heidi Woodard called requesting a medication refill for the following:    ? Medication: Oxycodone   ? Dosage: 5mg   ? Days left of medication: 0  ? Pharmacy: [facility]      The expected turnaround time is 3-4 business days       Check Indicates criteria has been reviewed and confirmed with the patient:    [x]  Preferred Name   [x]  DOB and/or MR#  [x]  Preferred Contact Method  [x]  Phone Number(s)   [x]  Preferred Pharmacy   []  MyChart     Thank you,  Drema Balzarine  [facility] Cancer Communication Center  [phone]

## 2018-12-28 MED ORDER — OXYCODONE 5 MG TABLET
ORAL_TABLET | 0 refills | 0 days | Status: CP
Start: 2018-12-28 — End: 2019-01-24

## 2018-12-28 NOTE — Unmapped (Signed)
Reviewed PDMP with assistance of pharmacist and nurse navigator. Note that she is requesting it much sooner (but technically at 1 month) than previously    Pharmacist ([place] Miller) verified sorafenib is being filled monthly on schedule.    Has appt next month for clinical follow up.

## 2018-12-28 NOTE — Unmapped (Signed)
Hi,    Patient Heidi Woodard called requesting a medication refill for the following:    ? Medication: Oxycodone  ? Dosage:5 mg  ? Days left of medication: 0  ? Pharmacy: [facility]     Program:   Speciality:      The expected turnaround time is 3-4 business days       Check Indicates criteria has been reviewed and confirmed with the patient:    []  Preferred Name   [x]  DOB and/or MR#  [x]  Preferred Contact Method  [x]  Phone Number(s)   [x]  Preferred Pharmacy   []  MyChart     Thank you,  [name]  [facility] Cancer Communication Center  [phone]

## 2019-01-08 ENCOUNTER — Ambulatory Visit: Admit: 2019-01-08 | Discharge: 2019-01-08 | Attending: Hematology & Oncology | Primary: Hematology & Oncology

## 2019-01-08 ENCOUNTER — Ambulatory Visit: Admit: 2019-01-08 | Discharge: 2019-01-08

## 2019-01-08 ENCOUNTER — Other Ambulatory Visit: Admit: 2019-01-08 | Discharge: 2019-01-08

## 2019-01-08 DIAGNOSIS — L989 Disorder of the skin and subcutaneous tissue, unspecified: Principal | ICD-10-CM

## 2019-01-08 DIAGNOSIS — E876 Hypokalemia: Principal | ICD-10-CM

## 2019-01-08 DIAGNOSIS — M25512 Pain in left shoulder: Principal | ICD-10-CM

## 2019-01-08 DIAGNOSIS — D481 Neoplasm of uncertain behavior of connective and other soft tissue: Principal | ICD-10-CM

## 2019-01-08 DIAGNOSIS — Z79899 Other long term (current) drug therapy: Principal | ICD-10-CM

## 2019-01-08 DIAGNOSIS — L98491 Non-pressure chronic ulcer of skin of other sites limited to breakdown of skin: Principal | ICD-10-CM

## 2019-01-08 DIAGNOSIS — Z79891 Long term (current) use of opiate analgesic: Principal | ICD-10-CM

## 2019-01-08 DIAGNOSIS — G8929 Other chronic pain: Secondary | ICD-10-CM

## 2019-01-08 DIAGNOSIS — R21 Rash and other nonspecific skin eruption: Principal | ICD-10-CM

## 2019-01-08 LAB — COMPREHENSIVE METABOLIC PANEL
ALBUMIN: 3.8 g/dL (ref 3.5–5.0)
ALKALINE PHOSPHATASE: 69 U/L (ref 38–126)
ALT (SGPT): 23 U/L (ref <35)
AST (SGOT): 35 U/L (ref 14–38)
BILIRUBIN TOTAL: 0.6 mg/dL (ref 0.0–1.2)
BLOOD UREA NITROGEN: 17 mg/dL (ref 7–21)
BUN / CREAT RATIO: 26
CALCIUM: 9 mg/dL (ref 8.5–10.2)
CHLORIDE: 102 mmol/L (ref 98–107)
CO2: 33 mmol/L — ABNORMAL HIGH (ref 22.0–30.0)
CO2: 33 mmol/L — ABNORMAL HIGH (ref 22.0–30.0)
CREATININE: 0.65 mg/dL (ref 0.60–1.00)
EGFR CKD-EPI AA FEMALE: >90 mL/min/{1.73_m2} (ref >=60)
EGFR CKD-EPI NON-AA FEMALE: >90 mL/min/{1.73_m2} (ref >=60)
GLUCOSE RANDOM: 90 mg/dL (ref 70–179)
POTASSIUM: 3.3 mmol/L — ABNORMAL LOW (ref 3.5–5.0)
PROTEIN TOTAL: 7.2 g/dL (ref 6.5–8.3)
SODIUM: 140 mmol/L (ref 135–145)

## 2019-01-08 LAB — CBC W/ AUTO DIFF
BASOPHILS ABSOLUTE COUNT: 0 10*9/L (ref 0.0–0.1)
BASOPHILS RELATIVE PERCENT: 0.5 %
EOSINOPHILS ABSOLUTE COUNT: 0.2 10*9/L (ref 0.0–0.4)
EOSINOPHILS RELATIVE PERCENT: 2.9 %
HEMATOCRIT: 38.6 % (ref 36.0–46.0)
HEMOGLOBIN: 12.5 g/dL (ref 12.0–16.0)
LARGE UNSTAINED CELLS: 1 % (ref 0–4)
LYMPHOCYTES RELATIVE PERCENT: 39.4 %
LYMPHOCYTES RELATIVE PERCENT: 39.4 % — ABNORMAL HIGH (ref 0–4)
MEAN CORPUSCULAR HEMOGLOBIN CONC: 32.3 g/dL (ref 31.0–37.0)
MEAN CORPUSCULAR HEMOGLOBIN: 28.1 pg (ref 26.0–34.0)
MEAN CORPUSCULAR VOLUME: 87 fL (ref 80.0–100.0)
MEAN PLATELET VOLUME: 9.1 fL (ref 7.0–10.0)
MONOCYTES RELATIVE PERCENT: 5.1 %
NEUTROPHILS ABSOLUTE COUNT: 3.6 10*9/L (ref 2.0–7.5)
NEUTROPHILS RELATIVE PERCENT: 51 %
PLATELET COUNT: 247 10*9/L (ref 150–440)
RED BLOOD CELL COUNT: 4.43 10*12/L (ref 4.00–5.20)
RED CELL DISTRIBUTION WIDTH: 16.1 % — ABNORMAL HIGH (ref 12.0–15.0)

## 2019-01-08 NOTE — Unmapped (Signed)
Your nurse navigator is:  [name], RN, BSN, OCN   Head & Neck/Sarcoma  Oncology Nurse Navigator  [email or web address]     For appointments & questions Monday through Friday 8 AM??? 5 PM   please call [phone] or Toll free [phone].     On Nights, Weekends and Holidays  Call [phone] and ask for the adult hematologist-oncologist on call.     [facility]  [address]  [place], [place] [address]  [email or web address]  Tel: [phone] / Toll Free [phone]  Fax: [phone]    Lab Results   Component Value Date    WBC 7.1 01/08/2019    HGB 12.5 01/08/2019    HCT 38.6 01/08/2019    PLT 247 01/08/2019       Lab Results   Component Value Date    NA 140 01/08/2019    K 3.3 (L) 01/08/2019    CL 102 01/08/2019    CO2 33.0 (H) 01/08/2019    BUN 17 01/08/2019    CREATININE 0.65 01/08/2019    GLU 90 01/08/2019    CALCIUM 9.0 01/08/2019    MG 1.7 11/29/2016       Lab Results   Component Value Date    BILITOT 0.6 01/08/2019    PROT 7.2 01/08/2019    ALBUMIN 3.8 01/08/2019    ALT 23 01/08/2019    AST 35 01/08/2019    ALKPHOS 69 01/08/2019       No results found for: LABPROT, INR, APTT

## 2019-01-08 NOTE — Unmapped (Signed)
Labs drawn and sent for analysis.  Care provided by  K Bavin, RN

## 2019-01-08 NOTE — Unmapped (Signed)
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 Continue prn 1% hydrocort cream to face(OTC), triamcinolone cream to body, sunscreen or avoidance, and to call if she has recurrent issues. PRN benadryl for itching with rash/flaking skin. Emolliants after bathing (cocoa butter ok).  Cornstarch or baby powder to skin folds to minimize chafing.   - Continue urea cream on callouses on feet and also along neck.  - Pt states she has 100% compliance with sorafenib.    Desmoid tumor associated pain -  - Reports taking 1 - 2 tabs oxycodone 5 mg per day with good pain control.  However, she has recently been refilling #100 per month.  Has not tried lidoderm patches.  - Keep log of pain medication utilization and bring to next visit.  Consider palliative care if pain not well controlled.    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 Continue aggressive emollient use to prevent dry skin.    Skin macule on upper lip  - Referral to derm on same day as f/u with oncology    Birth control: Says she is not sexually active. Understands teratogenic risk of pregnancy on sorafenib.    Hypokalemia  - Will send labs to PCP for review; likely secondary to anti-hypertensives    Dispo: RTC in 3 months with labs and again in 6 months w/ MRI, sooner if necessary. Reminded to call if she develops recurrent rash.    I personally reviewed the medical records, pathology and laboratory results and viewed the imaging. All questions were answered to the patient's satisfaction and they voiced understanding and agreement with the plan.    Louann Liv, PA  Medical Oncology  [facility] Hematology/Oncology  pager: [phone]  [email or web address]    *This is a shared visit evaluation with Dr. Loree Fee.  Please see her Attending MD attestation for additional details.     REASON FOR CONSULTATION:   Heidi Woodard is a 48 y.o. female who is seen in follow up for her desmoid fibromatosis.      HISTORY OF PRESENT ILLNESS:    About a year ago, pt developed shoulder pain associated with a fall. There was bruising and swelling but she attributed it to the injury. It never improved, and this summer she noted increased swelling and decreased ROM (which is limited by pain, rather than painless weakness). 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 She underwent CT of the left shoudler on 06/25/16 which showed a 16 cm mass involing the left deltoid with likely invasion of the left trapezius, triceps, and teres minor. Possible LN involvement of axillary and supraclav LN on left were present. On 07/07/2016 she saw Dr. Darral Dash and ultrasound-guided biopsy was performed with pathology revealing low-grade spindle cell lesion favoring fibromatosis. He felt that surgical  resection would be highly morbid. On 9/20/017, she saw Dr Beckey Rutter, who did not favor RT due to inherent risks associated with RT.     She has ongoing pain, some tingling along the backs of her hands. Pain is intermittent if her arm is at rest, happening about every other day but when it happens it is very intense. Per above it limits her ADLs although she says she is managing.      Interval history: Today (A history was conducted today with the same results as the history from my last visit, with differences noted below):    Continues on sorafenib 200mg  daily. Says she has not missed any doses.  No new medicines.  Skin -  Slightly worse cracking on hands/feet.  Using urea cream, gloves/socks at night.  Left shoulder pain; persists, slightly worse; using oxycodone 5 mg, typically 2 tabs per day which she feels is adequate for pain control.  No opioid related constipation.    No N/V/D/C    REVIEW OF SYSTEMS:  A comprehensive review of 14 systems was negative except for pertinent positives noted in HPI.    PMH: denies    PSH: denies    No family history on file.     Social History     Occupational History   ??? Not on file   Tobacco Use   ??? Smoking status: Never Smoker   ??? Smokeless tobacco: Never Used   Substance and Sexual Activity   ??? Alcohol use: No   ??? Drug use: No   ??? Sexual activity: Not on file       ALLERGIES/MEDICATIONS:  Reviewed in EPIC    PHYSICAL EXAM:   Vitals: reviewed per chart.   Gen: comfortable, NAD. ECOG PS 1  HEENT: EOMI, sclera anicteric, MMM.   Neck: Supple, trachea midline  Lymph: No cervical, submandibular, or supraclavicular lymphadenopathy   CV: RRR no murmurs   Lungs: CTAB w/o rales, rhochi, or wheezing, good respiratory effort   Abd: +BS, Soft, NT, ND, no masses appreciated   Ext: Warm and well perfused. No cyanosis, clubbing, or edema bilaterally   Skin: no rash. Skin very dry.  Neuro: Moving all extremities, speech fluid. CNII-XII grossly intact.  Sensation intact.  Psych: mood euthymic and affect appropriate   MSK: + left shoulder improved ROM, specifically Abduction; now can raise arm slightly past horizontal due to pain. Swollen, stabily enlarged.  Not significantly tender to palpation.  +++++++++++++++++++++++++++++++++++++++++++++++++++++++++++     PATHOLOGY:   Pathology was personally reviewed as described in the HPI, detailed below.   07/07/2016  Surgical pathology exam  Final Diagnosis   A: Shoulder, left, biopsy   - Low grade spindle cell lesion, favor fibromatosis  - The lesional cells are positive for beta-catenin, weakly positive for smooth muscle actin and desmin, and negative for S100, supporting the interpretation of fibromatosis  - Dr. Pollie Friar concurs  Addendum:  Dr. Morrison Old dictating: The last section of the impression should read:     Multiple mildly prominent left supraclavicular and axillary lymph nodes, metastatic disease cannot be excluded.    LABS:    Lab Results   Component Value Date    WBC 7.1 01/08/2019    HGB 12.5 01/08/2019    HCT 38.6 01/08/2019    MCV 87.0 01/08/2019    PLT 247 01/08/2019     Lab Results   Component Value Date    NEUTROABS 3.6 01/08/2019       Chemistry  Component Value Date/Time    NA 140 01/08/2019 1220    K 3.3 (L) 01/08/2019 1220    CL 102 01/08/2019 1220    CO2 33.0 (H) 01/08/2019 1220    BUN 17 01/08/2019 1220    CREATININE 0.65 01/08/2019 1220    GLU 90 01/08/2019 1220        Component Value Date/Time    CALCIUM 9.0 01/08/2019 1220    ALKPHOS 69 01/08/2019 1220    AST 35 01/08/2019 1220    ALT 23 01/08/2019 1220    BILITOT 0.6 01/08/2019 1220          Lab Results   Component Value Date    ALT 23 01/08/2019    AST 35 01/08/2019    ALKPHOS 69 01/08/2019    BILITOT 0.6 01/08/2019         RADIOLOGY:  Discussed w/ radiologist, report pending at time of visit.  Reported similar size and similar to improved enhancement.

## 2019-01-22 NOTE — Unmapped (Signed)
Hi,    Patient LUCRESIA SIMIC called requesting a medication refill for the following:    ? Medication: Oxycodone  ? Dosage: 5 MG  ? Days left of medication: 1  ? [facility]acy: [facility]       The expected turnaround time is 3-4 business days       Check Indicates criteria has been reviewed and confirmed with the patient:    [x]  Preferred Name   [x]  DOB and/or MR#  [x]  Preferred Contact Method  [x]  Phone Number(s)   [x]  Preferred Pharmacy   []  MyChart     Thank you,  Drema Balzarine  [facility] Cancer Communication Center  [phone]

## 2019-01-24 MED ORDER — OXYCODONE 5 MG TABLET: tablet | 0 refills | 0 days | Status: AC

## 2019-01-24 NOTE — Unmapped (Signed)
Returned call to pt. She needs a refill sent for oxycodone 5 mg.Told her I would send to team.

## 2019-01-24 NOTE — Unmapped (Signed)
Hi,    Patient Heidi Woodard called requesting a medication refill for the following:    ? Medication: Roxicodone  ? Dosage:  5 MG  ? Days left of medication: 1  ? Pharmacy: [facility]    The expected turnaround time is 3-4 business days       Check Indicates criteria has been reviewed and confirmed with the patient:    [x]  Preferred Name   [x]  DOB and/or MR#  [x]  Preferred Contact Method  [x]  Phone Number(s)   [x]  Preferred Pharmacy   []  MyChart     Thank you,  Drema Balzarine  [facility] Cancer Communication Center  [phone]

## 2019-01-25 NOTE — Unmapped (Signed)
Refilled oxycodone 5mg  #100

## 2019-01-26 MED ORDER — OXYCODONE 5 MG TABLET
ORAL_TABLET | ORAL | 0 refills | 0.00000 days | Status: CP
Start: 2019-01-26 — End: 2019-01-24

## 2019-01-30 NOTE — Unmapped (Signed)
Hi,    Patient Heidi Woodard called requesting a medication refill for the following:    ? Medication: Carmol cream  ? Dosage: 10% cream  ? Days left of medication: 0  ? Pharmacy: [facility]    Program: [facility]  Speciality: Medical Oncology     The expected turnaround time is 3-4 business days       Check Indicates criteria has been reviewed and confirmed with the patient:    [x]  Preferred Name   [x]  DOB and/or MR#  [x]  Preferred Contact Method  [x]  Phone Number(s)   [x]  Preferred Pharmacy   []  MyChart     Thank you,  [name]  [facility] Cancer Communication Center  [phone]

## 2019-02-05 MED ORDER — UREA 10 % TOPICAL CREAM
0 refills | 0 days | Status: CP
Start: 2019-02-05 — End: 2019-02-15

## 2019-02-12 NOTE — Unmapped (Signed)
I called and spoke with Heidi Woodard.  I let her know that the clinic was closed because of coronavirus.  She is comfortable waiting until the clinics reopen to be evaluated.

## 2019-02-14 NOTE — Unmapped (Signed)
Hi,    Patient Heidi Woodard called requesting a medication refill for the following:    ? Medication: Ooxycodone  ? Dosage: 5 Mg   ? Days left of medication: 5  ? [facility]acy[facility] in [place]    ? Medication: Urea  ? Dosage: 10 % cream  ? Days left of medication: 1-2  ? Pharmacy: [facility] in [place]    The expected turnaround time is 3-4 business days       Check Indicates criteria has been reviewed and confirmed with the patient:    []  Preferred Name   [x]  DOB and/or MR#  [x]  Preferred Contact Method  [x]  Phone Number(s)   [x]  Preferred Pharmacy   []  MyChart     Thank you,  [name]  [facility] Cancer Communication Center  [phone]

## 2019-02-14 NOTE — Unmapped (Signed)
Returned call to pt She states she needs a refill of her Oxycodone and Urea cream. Told her I would relay the message.

## 2019-02-15 MED ORDER — UREA 10 % TOPICAL CREAM
5 refills | 0 days | Status: CP
Start: 2019-02-15 — End: ?

## 2019-02-15 NOTE — Unmapped (Signed)
Notified pt Dr. Loree Fee sent in a refill for her urea cream. The oxycodone is not due for refill until 02-26-19. She verbalizes understanding and will call back with any further questions or concerns.

## 2019-02-20 NOTE — Unmapped (Signed)
Hi,    Patient Heidi Woodard called requesting a medication refill for the following:    ? Medication: roxicodone  ? Dosage: 5mg   ? Days left of medication: 0  ? Pharmacy: [facility]     Patient would like a call one Rx is sent.    Please contact patient at [phone].    The expected turnaround time is 3-4 business days       Check Indicates criteria has been reviewed and confirmed with the patient:    [x]  Preferred Name   [x]  DOB and/or MR#  [x]  Preferred Contact Method  [x]  Phone Number(s)   [x]  Preferred Pharmacy   []  MyChart     Thank you,  [name]  [facility] Cancer Communication Center  [phone]

## 2019-02-26 NOTE — Unmapped (Signed)
Hi,    Patient Heidi Woodard called requesting a medication refill for the following:    ? Medication: Roxicodone  ? Dosage: 5mg   ? Days left of medication: 0  ? Pharmacy: [facility]    Program: [facility]  Speciality: Medical Oncology     The expected turnaround time is 3-4 business days       Check Indicates criteria has been reviewed and confirmed with the patient:    [x]  Preferred Name   [x]  DOB and/or MR#  [x]  Preferred Contact Method  [x]  Phone Number(s)   [x]  Preferred Pharmacy   []  MyChart     Thank you,  Jacques Navy  [facility] Cancer Communication Center  [phone]

## 2019-02-27 MED ORDER — OXYCODONE 5 MG TABLET
ORAL_TABLET | 0 refills | 0 days | Status: CP
Start: 2019-02-27 — End: 2019-03-28

## 2019-02-27 NOTE — Unmapped (Signed)
Hi,    Patient Heidi Woodard called requesting a medication refill for the following:    ? Medication: Oxycodone  ? Dosage:5 mg  ? Days left of medication: 0  ? Pharmacy: [facility]     Program:   Speciality:      The expected turnaround time is 3-4 business days       Check Indicates criteria has been reviewed and confirmed with the patient:    []  Preferred Name   [x]  DOB and/or MR#  [x]  Preferred Contact Method  [x]  Phone Number(s)   [x]  Preferred Pharmacy   []  MyChart     Thank you,  [name]  [facility] Cancer Communication Center  [phone]

## 2019-03-27 NOTE — Unmapped (Signed)
Hi,    Patient Heidi Woodard called requesting a medication refill for the following:    ? Medication: oxycodone   ? Dosage: 5 mg  ? Days left of medication: 0  ? Pharmacy: [facility] in [place]      The expected turnaround time is 3-4 business days       Check Indicates criteria has been reviewed and confirmed with the patient:    []  Preferred Name   []  DOB and/or MR#  []  Preferred Contact Method  []  Phone Number(s)   []  Preferred Pharmacy   []  MyChart     Thank you,  Kelli Hope  [place] Cancer Communication Center  [phone]

## 2019-03-28 MED ORDER — OXYCODONE 5 MG TABLET
ORAL_TABLET | 0 refills | 0 days | Status: CP
Start: 2019-03-28 — End: 2019-04-27

## 2019-03-28 NOTE — Unmapped (Signed)
Hi,    Patient Heidi Woodard called requesting a medication refill for the following:    ? Medication: Oxycodone  ? Dosage:5 mg  ? Days left of medication: 0  ? Pharmacy: [facility]     Program:   Speciality:      The expected turnaround time is 3-4 business days       Check Indicates criteria has been reviewed and confirmed with the patient:    []  Preferred Name   [x]  DOB and/or MR#  [x]  Preferred Contact Method  [x]  Phone Number(s)   [x]  Preferred Pharmacy   []  MyChart     Thank you,  [name]  [facility] Cancer Communication Center  [phone]

## 2019-03-29 NOTE — Unmapped (Signed)
Refilled Oxycodone to fill on/after 5/28

## 2019-04-05 NOTE — Unmapped (Signed)
Phone Visit  Patient approved appointment change: yes  Patient demographics and insurance updated: yes  MSPQ complete: N/A  Instructions provided for phone visit: yes

## 2019-04-09 NOTE — Unmapped (Signed)
Hi,    Heidi Woodard is attempting to schedule labs at her local area [facility].  A new order is needed to proceed with scheduling the appointment.  The [facility] she is attempting to schedule with can be reached at [phone]    Please place an order and provide updates when complete so the patient can finalize the scheduling request with [facility].    Please call pt at [phone]    Thank you,   Drema Balzarine  [facility] Cancer Communication Center   [phone]

## 2019-04-09 NOTE — Unmapped (Signed)
Notified pt lab orders were placed

## 2019-04-09 NOTE — Unmapped (Signed)
Addended by: Lajuana Matte. on: 04/09/2019 01:55 PM     Modules accepted: Orders

## 2019-04-09 NOTE — Unmapped (Signed)
Returned call to pt. Pt was trying to schedule upcoming labs needed at a [facility] near her. She needs new orders sent in before they will let her schedule. The number is [id number]. She would like to be notified when these orders are entered so she can schedule the appt.Told her I would notify team.

## 2019-04-10 ENCOUNTER — Institutional Professional Consult (permissible substitution): Admit: 2019-04-10 | Discharge: 2019-04-11 | Attending: Hematology & Oncology | Primary: Hematology & Oncology

## 2019-04-10 ENCOUNTER — Ambulatory Visit: Admit: 2019-04-10 | Discharge: 2019-04-11

## 2019-04-10 DIAGNOSIS — L98491 Non-pressure chronic ulcer of skin of other sites limited to breakdown of skin: Secondary | ICD-10-CM

## 2019-04-10 DIAGNOSIS — R21 Rash and other nonspecific skin eruption: Secondary | ICD-10-CM

## 2019-04-10 DIAGNOSIS — D481 Neoplasm of uncertain behavior of connective and other soft tissue: Principal | ICD-10-CM

## 2019-04-10 DIAGNOSIS — G8929 Other chronic pain: Secondary | ICD-10-CM

## 2019-04-10 DIAGNOSIS — M25512 Pain in left shoulder: Secondary | ICD-10-CM

## 2019-04-10 NOTE — Unmapped (Signed)
Hi Shanite,     Karely Hurtado contacted the [employer] requesting to speak with the care team of Heidi Woodard to discuss:    Patient scheduled to have labs drawn on 04/12/19. Should her appointment be postponed?    Please contact [name] at [phone].        Check Indicates criteria has been reviewed and confirmed with the patient:    []  Preferred Name   []  DOB and/or MR#  []  Preferred Contact Method  []  Phone Number(s)   []  MyChart     Thank you,   Kelli Hope  [place] Cancer Communication Center   [phone]

## 2019-04-10 NOTE — Unmapped (Signed)
; ???  Support for cancer patients and their caregivers during the COVID-19 pandemic [email or web address]://[email or web address]  ; Keeping cancer patients safe during the COVID-19 crisis [email or web address]://[email or web address]/news/[email or web address]-during-the-covid-19-crisis/  +++++++++++++++++++++++++++++++++++++++++++++++++++++++++++  Your nurse navigator is:  [name], RN, BSN, OCN   Head & Neck/Sarcoma  Oncology Nurse Navigator  [email or web address]    For appointments & questions Monday through Friday 8 AM??? 5 PM   please call [phone] or Toll free [phone].    On Nights, Weekends and Holidays  Call [phone] and ask for the adult hematologist-oncologist on call.    [facility]  [address]  [place], [place] [address]  [email or web address]  Tel: [phone] / Toll Free [phone]  Fax: [phone]  +++++++++++++++++++++++++++++++++++++++++++++++++++++++++++

## 2019-04-10 NOTE — Unmapped (Signed)
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 Continue prn 1% hydrocort cream to face(OTC), triamcinolone cream to body, sunscreen or avoidance, and to call if she has recurrent issues. PRN benadryl for itching with rash/flaking skin. Emolliants after bathing (cocoa butter ok).  Cornstarch or baby powder to skin folds to minimize chafing.   - Continue urea cream on callouses on feet and also along neck.  - Pt states she has 100% compliance with sorafenib, no issues with refills or copays.  - Will review labs currently scheduled for 6/11    Desmoid tumor associated pain -  - Again reports taking 1 - 2 tabs oxycodone 5 mg per day with good pain control.  However, she has recently been refilling #100 per month.  Has not tried lidoderm patches.  - Pain continues to be well controlled but quickly escalates absent oxycodone.    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 Continue aggressive emollient use to prevent dry skin.    Skin macule on upper lip  - Referral to derm on same day as f/u with oncology    Birth control: Says she is not sexually active. Understands teratogenic risk of pregnancy on sorafenib.    Hypokalemia  - Will send labs to PCP for review; likely secondary to anti-hypertensives    Dispo: RTC in 3 months with labs and MRI, sooner if necessary. Reminded to call if she develops recurrent rash.    I spent 15 minutes on the phone with the patient. I spent an additional 15 minutes on pre- and post-visit activities.     The patient was physically located in [place] or a state in which I am permitted to provide care. The patient and/or parent/gauardian understood that s/he may incur co-pays and cost sharing, and agreed to the telemedicine visit. The visit was completed via phone and/or video, which was appropriate and reasonable under the circumstances given the patient's presentation at the time.    The patient and/or parent/guardian has been advised of the potential risks and limitations of this mode of treatment (including, but not limited to, the absence of in-person examination) and has agreed to be treated using telemedicine. The patient's/patient's family's questions regarding telemedicine have been answered.     If the phone/video visit was completed in an ambulatory setting, the patient and/or parent/guardian has also been advised to contact their provider???s office for worsening conditions, and seek emergency medical treatment and/or call 911 if the patient deems either necessary.        Norm Parcel, MD    *This is a shared visit evaluation with Dr. Loree Fee.  Please see her Attending MD attestation for additional details.     REASON FOR CONSULTATION:   Heidi Woodard is a 48 y.o. female who is seen in follow up for her desmoid fibromatosis.  HISTORY OF PRESENT ILLNESS:    About a year ago, pt developed shoulder pain associated with a fall. There was bruising and swelling but she attributed it to the injury. It never improved, and this summer she noted increased swelling and decreased ROM (which is limited by pain, rather than painless weakness). 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 She underwent CT of the left shoudler on 06/25/16 which showed a 16 cm mass involing the left deltoid with likely invasion of the left trapezius, triceps, and teres minor. Possible LN involvement of axillary and supraclav LN on left were present. On 07/07/2016 she saw Dr. Darral Dash and ultrasound-guided biopsy was performed with pathology revealing low-grade spindle cell lesion favoring fibromatosis. He felt that surgical resection would be highly morbid. On 9/20/017, she saw Dr Beckey Rutter, who did not favor RT due to inherent risks associated with RT.     She has ongoing pain, some tingling along the backs of her hands. Pain is intermittent if her arm is at rest, happening about every other day but when it happens it is very intense. Per above it limits her ADLs although she says she is managing.      Interval history: Today (A history was conducted today with the same results as the history from my last visit, with differences noted below):    Continues on sorafenib 200mg  daily. Says she has not missed any doses for any reason.  No new medicines.  Has lab appointment in 2 days 6/11 for her sorafenib use.    Skin -  Stable since last visit with some mild cracking on hands/feet but continues to be manageable with urea cream and gloves/socks at bedtime  Left shoulder pain; persists, stable since last visit. using oxycodone 5 mg, typically 2 tabs per day which she feels is adequate for pain control.  No opioid related constipation.    No N/V/D/C    REVIEW OF SYSTEMS:  A comprehensive review of 14 systems was negative except for pertinent positives noted in HPI.    PMH: denies    PSH: denies    No family history on file.     Social History     Occupational History   ??? Not on file   Tobacco Use   ??? Smoking status: Never Smoker   ??? Smokeless tobacco: Never Used   Substance and Sexual Activity   ??? Alcohol use: No   ??? Drug use: No   ??? Sexual activity: Not on file       ALLERGIES/MEDICATIONS:  Reviewed in EPIC    PHYSICAL EXAM:   - No exam for today's phone visit  +++++++++++++++++++++++++++++++++++++++++++++++++++++++++++     PATHOLOGY:   Pathology was personally reviewed as described in the HPI, detailed below.   07/07/2016  Surgical pathology exam  Final Diagnosis   A: Shoulder, left, biopsy   - Low grade spindle cell lesion, favor fibromatosis  - The lesional cells are positive for beta-catenin, weakly positive for smooth muscle actin and desmin, and negative for S100, supporting the interpretation of fibromatosis  - Dr. Pollie Friar concurs  Addendum:  Dr. Morrison Old dictating: The last section of the impression should read:     Multiple mildly prominent left supraclavicular and axillary lymph nodes, metastatic disease cannot be excluded.    LABS:    Lab Results   Component Value Date    WBC 7.1 01/08/2019    HGB  12.5 01/08/2019    HCT 38.6 01/08/2019    MCV 87.0 01/08/2019    PLT 247 01/08/2019     Lab Results   Component Value Date    NEUTROABS 3.6 01/08/2019       Chemistry        Component Value Date/Time    NA 140 01/08/2019 1220    K 3.3 (L) 01/08/2019 1220    CL 102 01/08/2019 1220    CO2 33.0 (H) 01/08/2019 1220    BUN 17 01/08/2019 1220    CREATININE 0.65 01/08/2019 1220    GLU 90 01/08/2019 1220        Component Value Date/Time    CALCIUM 9.0 01/08/2019 1220    ALKPHOS 69 01/08/2019 1220    AST 35 01/08/2019 1220    ALT 23 01/08/2019 1220    BILITOT 0.6 01/08/2019 1220          Lab Results   Component Value Date    ALT 23 01/08/2019    AST 35 01/08/2019    ALKPHOS 69 01/08/2019    BILITOT 0.6 01/08/2019         RADIOLOGY:  Discussed w/ radiologist, report pending at time of visit.  Reported similar size and similar to improved enhancement.

## 2019-04-11 NOTE — Unmapped (Signed)
STRATA ordered per Dr. Loree Fee, MD inbasket.      Histology will cut slides from 07/07/2016 biopsy for [employer]. Once report is generated it will be put in pts Labs tab in their EMR.

## 2019-04-13 LAB — CBC W/ DIFFERENTIAL
BANDED NEUTROPHILS ABSOLUTE COUNT: 0 10*3/uL (ref 0.0–0.1)
BASOPHILS ABSOLUTE COUNT: 0 10*3/uL (ref 0.0–0.2)
BASOPHILS RELATIVE PERCENT: 0 %
EOSINOPHILS ABSOLUTE COUNT: 0.2 10*3/uL (ref 0.0–0.4)
EOSINOPHILS RELATIVE PERCENT: 3 %
HEMATOCRIT: 41.3 % (ref 34.0–46.6)
IMMATURE GRANULOCYTES: 0 %
LYMPHOCYTES ABSOLUTE COUNT: 2.7 10*3/uL (ref 0.7–3.1)
LYMPHOCYTES RELATIVE PERCENT: 37 %
MEAN CORPUSCULAR HEMOGLOBIN CONC: 31 g/dL — ABNORMAL LOW (ref 31.5–35.7)
MEAN CORPUSCULAR HEMOGLOBIN: 27.4 pg (ref 26.6–33.0)
MEAN CORPUSCULAR VOLUME: 88 fL (ref 79–97)
MONOCYTES ABSOLUTE COUNT: 0.4 10*3/uL (ref 0.1–0.9)
MONOCYTES RELATIVE PERCENT: 6 %
NEUTROPHILS RELATIVE PERCENT: 54 %
PLATELET COUNT: 271 10*3/uL (ref 150–450)
RED BLOOD CELL COUNT: 4.68 x10E6/uL (ref 3.77–5.28)
RED CELL DISTRIBUTION WIDTH: 14.1 % (ref 11.7–15.4)
WHITE BLOOD CELL COUNT: 7.4 10*3/uL (ref 3.4–10.8)

## 2019-04-13 LAB — COMPREHENSIVE METABOLIC PANEL
A/G RATIO: 1.2 (ref 1.2–2.2)
ALBUMIN: 4.1 g/dL (ref 3.8–4.8)
ALT (SGPT): 16 IU/L (ref 0–32)
AST (SGOT): 28 IU/L (ref 0–40)
BILIRUBIN TOTAL: 0.3 mg/dL (ref 0.0–1.2)
BLOOD UREA NITROGEN: 14 mg/dL (ref 6–24)
BUN / CREAT RATIO: 18 (ref 9–23)
CALCIUM: 9.3 mg/dL (ref 8.7–10.2)
CHLORIDE: 101 mmol/L (ref 96–106)
CREATININE: 0.8 mg/dL (ref 0.57–1.00)
GFR MDRD AF AMER: 100 mL/min/{1.73_m2}
GFR MDRD NON AF AMER: 87 mL/min/{1.73_m2}
GLOBULIN, TOTAL: 3.4 g/dL (ref 1.5–4.5)
GLUCOSE: 90 mg/dL (ref 65–99)
POTASSIUM: 3.6 mmol/L (ref 3.5–5.2)
SODIUM: 142 mmol/L (ref 134–144)

## 2019-04-13 LAB — GFR MDRD NON AF AMER: Lab: 87

## 2019-04-13 LAB — PLATELET COUNT: Lab: 271

## 2019-04-19 NOTE — Unmapped (Signed)
Hi,     Patient has contacted the Communication Center in regards to the following symptom:     Pain: worsening    Please contact Patient at [phone]    Check Indicates criteria has been reviewed and confirmed with the patient:    [x]  Preferred Name   [x]  DOB and/or MR#  [x]  Preferred Contact Method  [x]  Phone Number(s)   []  MyChart     A page or telephone call has been made to the corresponding clinic.     Thank you,  Drema Balzarine   [facility] Cancer Communication Center   [phone]

## 2019-04-19 NOTE — Unmapped (Signed)
Returned call to pt. She is requesting a oxycodone refill. Told her I would notify team.

## 2019-04-19 NOTE — Unmapped (Signed)
Left V/M for pt stating she cannot have refill until 04-29-19. She can call back around 04-26-19 or with any further questions or concerns.

## 2019-04-26 NOTE — Unmapped (Signed)
Hi,    Patient Heidi Woodard called requesting a medication refill for the following:    ? Medication: Oxycodone  ? Dosage: 5 mg  ? Days left of medication: 0  ? [facility]        The expected turnaround time is 3-4 business days       Check Indicates criteria has been reviewed and confirmed with the patient:    [x]  Preferred Name   [x]  DOB and/or MR#  [x]  Preferred Contact Method  [x]  Phone Number(s)   [x]  Preferred Pharmacy   []  MyChart     Thank you,  Jacques Navy  [facility] Cancer Communication Center  [phone]

## 2019-04-26 NOTE — Unmapped (Signed)
Hi,     Patient contacted the Communication Center regarding the following:    - Following up on medication refill.    Please contact patient at [phone].    Thanks in advance,    [name]  [facility] Cancer Communication Center   [phone]

## 2019-04-26 NOTE — Unmapped (Signed)
Attempted to return call to pt. Left V/M that I sent request for refill of oxycodone to Dr. Loree Fee.

## 2019-04-27 MED ORDER — OXYCODONE 5 MG TABLET
ORAL_TABLET | 0 refills | 0 days | Status: CP
Start: 2019-04-27 — End: 2019-05-31

## 2019-04-27 NOTE — Unmapped (Signed)
Refilled oxycodone #100 to be filled on/after 6/28

## 2019-04-27 NOTE — Unmapped (Signed)
Hi,    Patient Heidi Woodard called requesting a medication refill for the following:    ??? Medication: Roxicodone  ??? Dosage: 5 MG  ??? Days left of medication: 0  ??? Pharmacy: [facility]      The expected turnaround time is 3-4 business days       Check Indicates criteria has been reviewed and confirmed with the patient:    [x]  Preferred Name   [x]  DOB and/or MR#  [x]  Preferred Contact Method  [x]  Phone Number(s)   [x]  Preferred Pharmacy   []  MyChart     Thank you,  Drema Balzarine  [facility] Cancer Communication Center  [phone]

## 2019-04-27 NOTE — Unmapped (Signed)
RN called pt to notify of oxy scipt ready to be picked up on 6/28. Unable to reach pt, VM not set up.

## 2019-05-24 NOTE — Unmapped (Signed)
Hi,    Patient Heidi Woodard called requesting a medication refill for the following:    ? Medication: roxicodone  ? Dosage: 5mg   ? Days left of medication: 0  ? Pharmacy: [facility]    The expected turnaround time is 3-4 business days       Check Indicates criteria has been reviewed and confirmed with the patient:    [x]  Preferred Name   [x]  DOB and/or MR#  [x]  Preferred Contact Method  [x]  Phone Number(s)   [x]  Preferred Pharmacy   []  MyChart     Thank you,  [name]  [facility] Cancer Communication Center  [phone]

## 2019-05-24 NOTE — Unmapped (Signed)
Attempted to return call to pt. Left V/M

## 2019-05-24 NOTE — Unmapped (Signed)
Called pt to let her know she can call back on Monday to get refill of roxicodone. She cannot have refill till 05-29-19.She verbalizes understanding.

## 2019-05-28 NOTE — Unmapped (Signed)
Hi,    Patient Heidi Woodard called requesting a medication refill for the following:    ? Medication: roxicodone  ? Dosage: 5mg   ? Days left of medication: 0  ? Pharmacy: [facility]    The expected turnaround time is 3-4 business days       Check Indicates criteria has been reviewed and confirmed with the patient:    [x]  Preferred Name   [x]  DOB and/or MR#  [x]  Preferred Contact Method  [x]  Phone Number(s)   [x]  Preferred Pharmacy   []  MyChart     Thank you,  [name]  [facility] Cancer Communication Center  [phone]

## 2019-05-29 NOTE — Unmapped (Signed)
Hi,    Patient NORMAJEAN NASH called requesting a medication refill for the following:    ? Medication: Oxycodone  ? Dosage: 5mg   ? Days left of medication: 0  ? [facility]        The expected turnaround time is 3-4 business days       Check Indicates criteria has been reviewed and confirmed with the patient:    [x]  Preferred Name   [x]  DOB and/or MR#  [x]  Preferred Contact Method  [x]  Phone Number(s)   [x]  Preferred Pharmacy   []  MyChart     Thank you,  Jacques Navy  [facility] Cancer Communication Center  [phone]

## 2019-05-29 NOTE — Unmapped (Signed)
Called patient to inform her that she could pick up her pain meds today.   Pt. Was very thankful

## 2019-05-30 NOTE — Unmapped (Signed)
Hi,    Patient Heidi Woodard called requesting a medication refill for the following: Patient stated still have not received prescription.    ? Medication: Oxycodone  ? Dosage: 5 mg  ? Days left of medication: 0  ? Pharmacy: [facility]    Program:   Speciality:      The expected turnaround time is 3-4 business days       Check Indicates criteria has been reviewed and confirmed with the patient:    []  Preferred Name   [x]  DOB and/or MR#  [x]  Preferred Contact Method  [x]  Phone Number(s)   [x]  Preferred Pharmacy   []  MyChart     Thank you,  [name]  [facility] Cancer Communication Center  [phone]

## 2019-05-31 MED ORDER — OXYCODONE 5 MG TABLET
ORAL_TABLET | 0 refills | 0 days | Status: CP
Start: 2019-05-31 — End: 2019-06-29

## 2019-05-31 NOTE — Unmapped (Signed)
Hi,     Patient contacted the Communication Center regarding the following:    - Following up on medication refill for roxicodone 5mg . Patient says she called her pharmacy yesterday and they still don't have it. Patient has been calling since 05/28/19.    Please contact patient at [phone].    Thanks in advance,    [name]  [facility] Cancer Communication Center   [phone]

## 2019-05-31 NOTE — Unmapped (Signed)
Returned call to pt. She is requesting oxycodone refill, Told her I would notify provider.

## 2019-05-31 NOTE — Unmapped (Signed)
Hi,    Patient Heidi Woodard called requesting a medication refill for the following:    ? Medication: Oxycodone  ? Dosage:5 mg  ? Days left of medication: 0  ? Pharmacy: [facility]     Program:   Speciality:      The expected turnaround time is 3-4 business days       Check Indicates criteria has been reviewed and confirmed with the patient:    []  Preferred Name   [x]  DOB and/or MR#  [x]  Preferred Contact Method  [x]  Phone Number(s)   [x]  Preferred Pharmacy   []  MyChart     Thank you,  [name]  [facility] Cancer Communication Center  [phone]

## 2019-06-22 NOTE — Unmapped (Signed)
Hi,     Patient contacted the Communication Center regarding the following:    - Set up MRI in Sept/2020 per note Urban 01/08/19. Patient was set up for appt with Dr. Meredith Mody on 07/18/19 by S. Rollings and no MRI is available that day. The patient would like to have both appointments on the same day.     Please contact patient at [phone].    Thanks in advance,    Drema Balzarine  [facility] Cancer Communication Center   [phone]

## 2019-06-26 NOTE — Unmapped (Signed)
Hi,    Patient Chenee Munns called requesting a medication refill for the following:    ? Medication: oxycodone  ? Dosage: 5 mg tablet  ? Days left of medication: 0  ? Pharmacy: [facility] in [place]        Thank you,  Kelli Hope  [facility] Cancer Communication Center  [phone]

## 2019-06-27 NOTE — Unmapped (Signed)
Hi,     Patient contacted the Communication Center regarding the following:    - Calling to f/u on status for refill request that was placed on 06/26/2019    Please contact Heidi Woodard at [phone] .    Thanks in advance,    Heidi Woodard  [facility] Cancer Communication Center   [phone]

## 2019-06-27 NOTE — Unmapped (Signed)
Attempted to return call. No VM available.

## 2019-06-28 NOTE — Unmapped (Signed)
Returned call to pt. She states she refilled Oxycodone on 05-31-19.Requesting new refill.Told her I would notify provider.

## 2019-06-28 NOTE — Unmapped (Signed)
Hi,    Patient Heidi Woodard called requesting a medication refill for the following:    ? Medication: Oxycodone   ? Dosage: 5mg   ? Days left of medication: 0  ? [facility]         The expected turnaround time is 3-4 business days       Check Indicates criteria has been reviewed and confirmed with the patient:    [x]  Preferred Name   [x]  DOB and/or MR#  [x]  Preferred Contact Method  [x]  Phone Number(s)   [x]  Preferred Pharmacy   []  MyChart     Thank you,  Jacques Navy  [facility] Cancer Communication Center  [phone]

## 2019-06-29 MED ORDER — OXYCODONE 5 MG TABLET
ORAL_TABLET | 0 refills | 0 days | Status: CP
Start: 2019-06-29 — End: ?

## 2019-07-18 ENCOUNTER — Encounter: Admit: 2019-07-18 | Discharge: 2019-07-19 | Payer: BLUE CROSS/BLUE SHIELD

## 2019-07-18 ENCOUNTER — Encounter
Admit: 2019-07-18 | Discharge: 2019-07-19 | Payer: BLUE CROSS/BLUE SHIELD | Attending: Hematology & Oncology | Primary: Hematology & Oncology

## 2019-07-18 DIAGNOSIS — D481 Neoplasm of uncertain behavior of connective and other soft tissue: Secondary | ICD-10-CM

## 2019-07-21 NOTE — Unmapped (Signed)
Per 09/18 inbasket to move 09/21 appt from Loree Fee to [facility] at 11.    Appt rescheduled as requested and confirmed with patient.

## 2019-07-23 ENCOUNTER — Telehealth: Admit: 2019-07-23 | Discharge: 2019-07-24

## 2019-07-23 DIAGNOSIS — D481 Neoplasm of uncertain behavior of connective and other soft tissue: Secondary | ICD-10-CM

## 2019-07-23 NOTE — Unmapped (Signed)
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 Continue prn 1% hydrocort cream to face(OTC), triamcinolone cream to body, sunscreen or avoidance, and to call if she has recurrent issues. PRN benadryl for itching with rash/flaking skin. Emolliants after bathing (cocoa butter ok).  Cornstarch or baby powder to skin folds to minimize chafing.   - Continue urea cream on callouses on feet and also along neck.   - MRI Left shoulder, 07/18/19, Continued decrease in edema and enhancement of the large fibrotic mass posterior to the left shoulder with unchanged to slightly decreased size.  - Pt states she has 100% compliance with sorafenib, no issues with refills or copays.    - Will review labs planned for later this week at [facility].  C/W sorafenib with f/u in 3 months with labs.    Desmoid tumor associated pain -  - Reports taking 2 - 3 tabs oxycodone 5 mg per day with good pain control.  She reports pain is better overall, but working (which she has been doing more lately) exacerbates her desmoid-related pain.  She has recently been refilling #100 per month.  Has not tried lidoderm patches.  - Pain continues to be well controlled but quickly escalates absent oxycodone.    - Oxy 5 #100 refilled by Dr Loree Fee on  07/01/19.    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 Continue aggressive emollient use to prevent dry skin.    Skin macule on upper lip  - Referral to derm earlier this year.  Cancelled d/t COVID-19.  Will send msg to derm for rescheduling.     Birth control: Says she is not sexually active. Previously discussed teratogenic risk of pregnancy on sorafenib.    Hypokalemia  - Repeat labs planned this week.     Dispo: Proceed with labs this week and then RTC in 3 months with labs sooner if necessary.  Plan MRI in six months.      Billing - A total of 12 minutes was spent with patient via telephone encounter today, all of which was time spent providing recommendations and coordinating care.  The patient is aware that insurance may have copay or coinsurance for this telephone encounter.       I spent 12 minutes on the real-time audio and video with the patient. I spent an additional 15 minutes on pre- and post-visit activities.     The patient was physically located in [place] or a state in which I am permitted to provide care. The patient and/or parent/guardian understood that s/he may incur co-pays and cost sharing, and agreed to the telemedicine visit. The visit was reasonable and appropriate under the circumstances given the patient's presentation at the time.    The patient and/or parent/guardian has been advised of the potential risks and limitations of this mode of treatment (including, but not limited to, the absence of in-person examination) and has agreed to be treated using  telemedicine. The patient's/patient's family's questions regarding telemedicine have been answered.     If the visit was completed in an ambulatory setting, the patient and/or parent/guardian has also been advised to contact their provider???s office for worsening conditions, and seek emergency medical treatment and/or call 911 if the patient deems either necessary.       REASON FOR CONSULTATION:   Heidi Woodard is a 48 y.o. female who is seen in follow up for her desmoid fibromatosis.      HISTORY OF PRESENT ILLNESS:    About a year ago, pt developed shoulder pain associated with a fall. There was bruising and swelling but she attributed it to the injury. It never improved, and this summer she noted increased swelling and decreased ROM (which is limited by pain, rather than painless weakness). 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 She underwent CT of the left shoudler on 06/25/16 which showed a 16 cm mass involing the left deltoid with likely invasion of the left trapezius, triceps, and teres minor. Possible LN involvement of axillary and supraclav LN on left were present. On 07/07/2016 she saw Dr. Darral Dash and ultrasound-guided biopsy was performed with pathology revealing low-grade spindle cell lesion favoring fibromatosis. He felt that surgical resection would be highly morbid. On 9/20/017, she saw Dr Beckey Rutter, who did not favor RT due to inherent risks associated with RT.     She has ongoing pain, some tingling along the backs of her hands. Pain is intermittent if her arm is at rest, happening about every other day but when it happens it is very intense. Per above it limits her ADLs although she says she is managing.      Interval history: Today (A history was conducted today with the same results as the history from my last visit, with differences noted below):    Continues on sorafenib 200mg  daily. Says she has not missed any doses for any reason.  No new medicines.      Skin -  Stable since last visit with some mild cracking on mainly on feet but continues to be manageable with urea cream and gloves/socks at bedtime.  No tenderness or numbness associated.  Not limiting activity.   Left shoulder pain; persists, stable to improved since last visit. Exacerbated by working, which she has been doing more of lately.  using oxycodone 5 mg, typically 2 -3 tabs per day which she feels is adequate for pain control.  No opioid related constipation.  No numbness/tingling.   No N/V/D/C    REVIEW OF SYSTEMS:  A comprehensive review of 14 systems was negative except for pertinent positives noted in HPI.    PMH: denies    PSH: denies    No family history on file.     Social History     Occupational History   ??? Not on file   Tobacco Use   ??? Smoking status: Never Smoker   ??? Smokeless tobacco: Never Used   Substance and Sexual Activity   ??? Alcohol use: No   ??? Drug use: No   ??? Sexual activity: Not on file       ALLERGIES/MEDICATIONS:  Reviewed in EPIC    PHYSICAL EXAM:   - General well appearing female in NAD.  Unlabored breathing.   +++++++++++++++++++++++++++++++++++++++++++++++++++++++++++     PATHOLOGY:   Pathology was personally reviewed as described in the HPI, detailed below.  07/07/2016  Surgical pathology exam  Final Diagnosis   A: Shoulder, left, biopsy   - Low grade spindle cell lesion, favor fibromatosis  - The lesional cells are positive for beta-catenin, weakly positive for smooth muscle actin and desmin, and negative for S100, supporting the interpretation of fibromatosis  - Dr. Pollie Friar concurs  Addendum:  Dr. Morrison Old dictating: The last section of the impression should read:     Multiple mildly prominent left supraclavicular and axillary lymph nodes, metastatic disease cannot be excluded.    LABS:    Lab Results   Component Value Date    WBC 7.4 04/12/2019    HGB 12.8 04/12/2019    HCT 41.3 04/12/2019    MCV 88 04/12/2019    PLT 271 04/12/2019     Lab Results   Component Value Date    NEUTROABS 4.0 04/12/2019       Chemistry        Component Value Date/Time    NA 142 04/12/2019 0950    K 3.6 04/12/2019 0950    CL 101 04/12/2019 0950    CO2 30 (H) 04/12/2019 0950    BUN 14 04/12/2019 0950    CREATININE 0.80 04/12/2019 0950    GLU 90 01/08/2019 1220        Component Value Date/Time    CALCIUM 9.3 04/12/2019 0950    ALKPHOS 71 04/12/2019 0950    AST 28 04/12/2019 0950    ALT 16 04/12/2019 0950    BILITOT 0.3 04/12/2019 0950          Lab Results   Component Value Date    ALT 16 04/12/2019    AST 28 04/12/2019    ALKPHOS 71 04/12/2019    BILITOT 0.3 04/12/2019         RADIOLOGY:  MRI 07/18/19 showed stable to slightly smaller mass.

## 2019-07-23 NOTE — Unmapped (Signed)
; ???  Support for cancer patients and their caregivers during the COVID-19 pandemic [email or web address]://[email or web address]  ; Keeping cancer patients safe during the COVID-19 crisis [email or web address]://[email or web address]/news/[email or web address]-during-the-covid-19-crisis/  +++++++++++++++++++++++++++++++++++++++++++++++++++++++++++  Your nurse navigator is:  [name], RN, BSN, OCN   Head & Neck/Sarcoma  Oncology Nurse Navigator  [email or web address]    For appointments & questions Monday through Friday 8 AM??? 5 PM   please call [phone] or Toll free [phone].    On Nights, Weekends and Holidays  Call [phone] and ask for the adult hematologist-oncologist on call.    [facility]  [address]  [place], [place] [address]  [email or web address]  Tel: [phone] / Toll Free [phone]  Fax: [phone]  +++++++++++++++++++++++++++++++++++++++++++++++++++++++++++

## 2019-07-24 NOTE — Unmapped (Signed)
Confirmed 12/21 appt with patient's sister.

## 2019-07-26 NOTE — Unmapped (Signed)
Addended by: Lajuana Matte. on: 07/25/2019 07:11 PM     Modules accepted: Orders

## 2019-07-27 NOTE — Unmapped (Signed)
Hi,     Patient contacted the Communication Center requesting to speak with the care team of Alexah Kivett to discuss:    Patient having concerns regarding flu shot they had on yesterday and now they are having a bad reaction and headaches.    Please contact patient at [phone].    Program:   Speciality:     Check Indicates criteria has been reviewed and confirmed with the patient:    []  Preferred Name   [x]  DOB and/or MR#  [x]  Preferred Contact Method  [x]  Phone Number(s)   []  MyChart     Thank you,   [name]  [facility] Communication Center   [phone]

## 2019-07-30 NOTE — Unmapped (Signed)
Returned pt's call from Friday. States she received flu shot on Thursday and felt a little light headed a few hours after receiving. She returned to baseline shortly after and now feels issue has resolved.

## 2019-07-31 LAB — COMPREHENSIVE METABOLIC PANEL
A/G RATIO: 1.4 (ref 1.2–2.2)
ALBUMIN: 4.2 g/dL (ref 3.8–4.8)
ALKALINE PHOSPHATASE: 77 IU/L (ref 39–117)
ALT (SGPT): 19 IU/L (ref 0–32)
AST (SGOT): 26 IU/L (ref 0–40)
BILIRUBIN TOTAL: 0.3 mg/dL (ref 0.0–1.2)
BLOOD UREA NITROGEN: 18 mg/dL (ref 6–24)
BUN / CREAT RATIO: 23 (ref 9–23)
CALCIUM: 8.9 mg/dL (ref 8.7–10.2)
CHLORIDE: 100 mmol/L (ref 96–106)
CO2: 29 mmol/L (ref 20–29)
CREATININE: 0.77 mg/dL (ref 0.57–1.00)
GFR MDRD AF AMER: 104 mL/min/{1.73_m2}
GFR MDRD NON AF AMER: 90 mL/min/{1.73_m2}
GLUCOSE: 94 mg/dL (ref 65–99)
POTASSIUM: 3.4 mmol/L — ABNORMAL LOW (ref 3.5–5.2)
SODIUM: 142 mmol/L (ref 134–144)

## 2019-07-31 LAB — CBC W/ DIFFERENTIAL
BASOPHILS ABSOLUTE COUNT: 0 10*3/uL (ref 0.0–0.2)
BASOPHILS RELATIVE PERCENT: 0 %
EOSINOPHILS ABSOLUTE COUNT: 0.5 10*3/uL — ABNORMAL HIGH (ref 0.0–0.4)
EOSINOPHILS RELATIVE PERCENT: 7 %
HEMATOCRIT: 39.1 % (ref 34.0–46.6)
HEMOGLOBIN: 12.7 g/dL (ref 11.1–15.9)
IMMATURE GRANULOCYTES: 0 %
LYMPHOCYTES ABSOLUTE COUNT: 2.5 10*3/uL (ref 0.7–3.1)
LYMPHOCYTES RELATIVE PERCENT: 37 %
MEAN CORPUSCULAR HEMOGLOBIN CONC: 32.5 g/dL (ref 31.5–35.7)
MEAN CORPUSCULAR HEMOGLOBIN: 28 pg (ref 26.6–33.0)
MEAN CORPUSCULAR VOLUME: 86 fL (ref 79–97)
MONOCYTES ABSOLUTE COUNT: 0.4 10*3/uL (ref 0.1–0.9)
MONOCYTES RELATIVE PERCENT: 6 %
NEUTROPHILS ABSOLUTE COUNT: 3.4 10*3/uL (ref 1.4–7.0)
NEUTROPHILS RELATIVE PERCENT: 50 %
RED BLOOD CELL COUNT: 4.53 x10E6/uL (ref 3.77–5.28)
RED CELL DISTRIBUTION WIDTH: 13.3 % (ref 11.7–15.4)
WHITE BLOOD CELL COUNT: 6.7 10*3/uL (ref 3.4–10.8)

## 2019-07-31 LAB — MEAN CORPUSCULAR HEMOGLOBIN CONC: Erythrocyte mean corpuscular hemoglobin concentration:MCnc:Pt:RBC:Qn:Automated count: 32.5

## 2019-07-31 LAB — CALCIUM: Calcium:MCnc:Pt:Ser/Plas:Qn:: 8.9

## 2019-07-31 NOTE — Unmapped (Signed)
Spoke to patient regarding lab results.  Overall normal, K slightly low at 3.4. Pt is aware.  Pt will schedule consultation with dermatology for skin macule on upper lip.  Previously cancelled d/t COVID-19.  Reminded patient to get repeat labs at [facility] prior to her visit in December.

## 2019-08-01 NOTE — Unmapped (Signed)
Hi,    Patient Heidi Woodard called requesting a medication refill for the following:    ? Medication: oxycodone  ? Dosage: 5 mg  ? Days left of medication: 0  ? Pharmacy: [facility] in [place]        The expected turnaround time is 3-4 business days       Check Indicates criteria has been reviewed and confirmed with the patient:    []  Preferred Name   []  DOB and/or MR#  []  Preferred Contact Method  []  Phone Number(s)   []  Preferred Pharmacy   []  MyChart     Thank you,  Kelli Hope  [place] [facility] Communication Center  [phone]

## 2019-08-03 NOTE — Unmapped (Signed)
Hi,     Adeena contacted the [employer] requesting to speak with the care team of Coleta Grosshans to discuss:    Patient called to check on status of refill.    Please contact Tabatha at [phone].        Check Indicates criteria has been reviewed and confirmed with the patient:    [x]  Preferred Name   [x]  DOB and/or MR#  [x]  Preferred Contact Method  [x]  Phone Number(s)   []  MyChart     Thank you,   Jacques Navy  [facility] Cancer Communication Center   [phone]

## 2019-08-03 NOTE — Unmapped (Signed)
Hi,    Patient Heidi Woodard called requesting a medication refill for the following:    ? Medication: Oxycodone  ? Dosage: 5 mg  ? Days left of medication: 0  ? [facility]    Program:   Speciality:      The expected turnaround time is 3-4 business days       Check Indicates criteria has been reviewed and confirmed with the patient:    []  Preferred Name   [x]  DOB and/or MR#  [x]  Preferred Contact Method  [x]  Phone Number(s)   [x]  Preferred Pharmacy   []  MyChart     Thank you,  [name]  [facility] Cancer Communication Center  [phone]

## 2019-08-06 MED ORDER — OXYCODONE 5 MG TABLET
ORAL_TABLET | 0 refills | 0 days | Status: CP
Start: 2019-08-06 — End: ?

## 2019-08-06 NOTE — Unmapped (Signed)
Returned call to pt. She is requesting refill of oxycodone. Told her I will notify provider.

## 2019-08-06 NOTE — Unmapped (Signed)
Hi,   ??  Heidi Woodard contacted the [employer] requesting to speak with the care team of Heidi Woodard to discuss:  ??  Patient called to check on status of refill.  ??  Please contact Minah at [phone].  ??  ??  ??  Check Indicates criteria has been reviewed and confirmed with the patient:  ??  [x] ? Preferred Name   [x] ? DOB and/or MR#  [x] ? Preferred Contact Method  [x] ? Phone Number(s)   [] ? MyChart     Thank you,   [name]  [facility] Cancer Communication Center  [phone]

## 2019-08-06 NOTE — Unmapped (Signed)
Left message for pt that her refill request was sent to pharmacy.

## 2019-08-28 NOTE — Unmapped (Signed)
Hi,    Patient Heidi Woodard called requesting a medication refill for the following:    ? Medication: ROXICODONE  ? Dosage: 5 MG   ? Days left of medication: 0  ? [facility]  ?   The expected turnaround time is 3-4 business days       Check Indicates criteria has been reviewed and confirmed with the patient:    [x]  Preferred Name   [x]  DOB and/or MR#  [x]  Preferred Contact Method  [x]  Phone Number(s)   [x]  Preferred Pharmacy   []  MyChart     Thank you,  Drema Balzarine  [facility] Cancer Communication Center  [phone]

## 2019-08-31 NOTE — Unmapped (Signed)
Hi,     Heidi Woodard contacted the Communication Center requesting to speak with the care team of Heidi Woodard to discuss:    Calling to follow up on request for oxycodone prescription.    Please contact Heidi Woodard at [phone].      Thank you,   Kelli Hope  [facility] Cancer Communication Center   [phone]

## 2019-08-31 NOTE — Unmapped (Signed)
Called patient to let her know that she is a little early on her refill request and she should call back next week. She was agreeable to this.

## 2019-09-03 NOTE — Unmapped (Signed)
Hi,     Kathyrn Warmuth contacted the Communication Center requesting to speak with the care team of Keyleen Cerrato to discuss:    Called back to follow up on request for oxycodone refill. Please send to [facility].    Please contact [name] at [phone].        Thank you,   Kelli Hope  [facility] Cancer Communication Center   [phone]

## 2019-09-06 MED ORDER — OXYCODONE 5 MG TABLET
ORAL_TABLET | 0 refills | 0 days | Status: CP
Start: 2019-09-06 — End: ?

## 2019-09-06 NOTE — Unmapped (Signed)
Called pt to let her know refill was sent to pharmacy. Will call back with any further questions or concerns.

## 2019-09-07 NOTE — Unmapped (Signed)
Hi,     Heidi Woodard contacted the [employer] regarding the following:    - [facility] Rx was there and was told they didn't have the request. I called this morning and was told that on [facility] end there is an error message. I was told that if another was sent they could cancel the first request from the system. [facility] in [place] contact is [phone].    Please contact [name] at [phone] for any follow up.    Thanks in advance,    Drema Balzarine  [facility] Communication Center   [phone]

## 2019-10-04 DIAGNOSIS — D481 Neoplasm of uncertain behavior of connective and other soft tissue: Principal | ICD-10-CM

## 2019-10-08 NOTE — Unmapped (Signed)
Hi,  ??  Patient Heidi Woodard called requesting a medication refill for the following:  ??  ?? Medication: ROXICODONE  ?? Dosage: 5 MG   ?? Days left of medication: 0  ?? [facility]  ?? ??  The expected turnaround time is 3-4 business days   ??  ??  Check Indicates criteria has been reviewed and confirmed with the patient:  ??  [x] ? Preferred Name   [x] ? DOB and/or MR#  [x] ? Preferred Contact Method  [x] ? Phone Number(s)   [x] ? Preferred Pharmacy   [] ? MyChart   ??  Thank you,   [name]  [facility] Communication Center  [phone]

## 2019-10-10 MED ORDER — OXYCODONE 5 MG TABLET
ORAL_TABLET | 0 refills | 0 days | Status: CP
Start: 2019-10-10 — End: ?

## 2019-10-10 NOTE — Unmapped (Signed)
Hi,     Heidi Woodard contacted the [employer] requesting to speak with the care team of Heidi Woodard to discuss:    Calling to request follow up on status of oxycodone prescription. Patient reports she is completely out.    Please contact [name] at [phone].    Thank you,   Kelli Hope  [facility] Cancer Communication Center   [phone]

## 2019-10-10 NOTE — Unmapped (Signed)
Called to let pt know script for roxicodone was sent in.

## 2019-10-11 NOTE — Unmapped (Signed)
Clinical Pharmacist Practitioner: Refill Note    Drug Therapy Refill: Sorafenib    I reviewed her most recent provider notes and she does not appear to have any significant adverse effects due to therapy, and lab values from 07/30/19 are WNL.    11 refills were sent on 10/10/19 to [facility] by [employer], patient receiving medication through [employer] assistance program.     Nicholos Johns, PharmD, CPP  Clinical Pharmacist Practitioner, Thoracic Oncology & Sarcoma  Pager: [phone]

## 2019-10-18 NOTE — Unmapped (Signed)
Left VM about appt time change on 12/21 to 2 pm with the PA

## 2019-10-22 ENCOUNTER — Encounter: Admit: 2019-10-22 | Discharge: 2019-10-23 | Payer: BLUE CROSS/BLUE SHIELD

## 2019-10-22 NOTE — Unmapped (Signed)
SARCOMA ONCOLOGY FOLLOW UP VISIT    Encounter Date: 10/22/2019  Patient Name: Heidi Woodard  Medical Record Number: [id number]    Referring Physician: Dr Edilia Bo, Ortho onc  Primary Care Provider: [facility] Department    DIAGNOSIS: large desmoid fibromatosis    ++++++++++++++++++++++++++++++++++++++++++++++++++++++  Billing - A total of 13 minutes was spent with patient via telephone encounter today, all of which was time spent providing recommendations and coordinating care.  The patient is aware that insurance may have copay or coinsurance for this telephone encounter.       I spent 13 minutes on the real-time audio and video with the patient. I spent an additional 20 minutes on pre- and post-visit activities.     The patient was physically located in [place] or a state in which I am permitted to provide care. The patient and/or parent/guardian understood that s/he may incur co-pays and cost sharing, and agreed to the telemedicine visit. The visit was reasonable and appropriate under the circumstances given the patient's presentation at the time.    The patient and/or parent/guardian has been advised of the potential risks and limitations of this mode of treatment (including, but not limited to, the absence of in-person examination) and has agreed to be treated using telemedicine. The patient's/patient's family's questions regarding telemedicine have been answered.     If the visit was completed in an ambulatory setting, the patient and/or parent/guardian has also been advised to contact their provider???s office for worsening conditions, and seek emergency medical treatment and/or call 911 if the patient deems either necessary.    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 Continue prn 1% hydrocort cream to face(OTC), triamcinolone cream to body, sunscreen or avoidance, and to call if she has recurrent issues. PRN benadryl for itching with rash/flaking skin. Emolliants after bathing (cocoa butter ok).  Cornstarch or baby powder to skin folds to minimize chafing.   - Continue urea cream on callouses on feet and also along neck.   - MRI Left shoulder, 07/18/19, Continued decrease in edema and enhancement of the large fibrotic mass posterior to the left shoulder with unchanged to slightly decreased size.  - Pt states she has 100% compliance with sorafenib, no issues with refills or copays.    - Will review labs planned for later this week at [facility].  If unable to get them done there, will proceed with labs at [facility].  C/W sorafenib with f/u in 3 months with labs.    Desmoid tumor associated pain -  - Reports taking 1 - 2 tabs oxycodone 5 mg per day with good pain control.  She reports pain is OK overall, but working exacerbates her desmoid-related pain.  She has recently been refilling #100 per month.  Has not tried lidoderm patches.  - Pain continues to be well controlled but quickly escalates absent oxycodone.    - Oxy 5 #100 refilled by Dr Loree Fee on 07/01/19.    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 Continue aggressive emollient use to prevent dry skin.    Skin macule on upper lip  - Referral to derm earlier this year.  Cancelled d/t COVID-19.  Will resend referral today.    Birth control: Says she is not sexually active. Previously discussed teratogenic risk of pregnancy on sorafenib.    Hypokalemia  - on oral K per PCP.  Has f/u labs this week.      Dispo: Proceed with labs this week and then RTC in 3 months with labs and MRI sooner if necessary.        REASON FOR CONSULTATION:   Heidi Woodard is a 48 y.o. female who is seen in follow up for her desmoid fibromatosis.      HISTORY OF PRESENT ILLNESS:    About a year ago, pt developed shoulder pain associated with a fall. There was bruising and swelling but she attributed it to the injury. It never improved, and this summer she noted increased swelling and decreased ROM (which is limited by pain, rather than painless weakness). 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 She underwent CT of the left shoudler on 06/25/16 which showed a 16 cm mass involing the left deltoid with likely invasion of the left trapezius, triceps, and teres minor. Possible LN involvement of axillary and supraclav LN on left were present. On 07/07/2016 she saw Dr. Darral Dash and ultrasound-guided biopsy was performed with pathology revealing low-grade spindle cell lesion favoring fibromatosis. He felt that surgical resection would be highly morbid. On 9/20/017, she saw Dr Beckey Rutter, who did not favor RT due to inherent risks associated with RT.     She has ongoing pain, some tingling along the backs of her hands. Pain is intermittent if her arm is at rest, happening about every other day but when it happens it is very intense. Per above it limits her ADLs although she says she is managing.      Interval history: Today (A history was conducted today with the same results as the history from my last visit, with differences noted below):    Continues on sorafenib 200mg  daily. Says she has not missed any doses for any reason.  Saw PCP last week for labs; started on oral K and has f/u with PCP this week for repeat labs.  Reports BP was OK at PCP's office.      Skin -  Stable since last visit with improvement on hand cracking and stable mild cracking on mainly on feet but continues to be manageable with urea cream and gloves/socks at bedtime.  No tenderness or numbness associated.  Not limiting activity.   Left shoulder pain; persists, stable since last visit. Exacerbated by working, which she has been doing more of lately.  using oxycodone 5 mg, typically 1 -2 tabs per day which she feels is adequate for pain control.  No opioid related constipation.  No numbness/tingling.  Feels that the shoulder mass has gotten smaller over the past 3 months.  Trying to use less pain medication.   No N/V/D/C  Has not seen dermatology about macule on lip, says it is unchanged.     REVIEW OF SYSTEMS:  A comprehensive review of 14 systems was negative except for pertinent positives noted in HPI.    PMH: denies    PSH: denies    No family history on file.     Social History     Occupational History   ??? Not on file   Tobacco Use   ??? Smoking status: Never Smoker   ??? Smokeless tobacco:  Never Used   Substance and Sexual Activity   ??? Alcohol use: No   ??? Drug use: No   ??? Sexual activity: Not on file       ALLERGIES/MEDICATIONS:  Reviewed in EPIC    PHYSICAL EXAM:   - General well appearing female in NAD.  Unlabored breathing.   +++++++++++++++++++++++++++++++++++++++++++++++++++++++++++     PATHOLOGY:   Pathology was personally reviewed as described in the HPI, detailed below.   07/07/2016  Surgical pathology exam  Final Diagnosis   A: Shoulder, left, biopsy   - Low grade spindle cell lesion, favor fibromatosis  - The lesional cells are positive for beta-catenin, weakly positive for smooth muscle actin and desmin, and negative for S100, supporting the interpretation of fibromatosis  - Dr. Pollie Friar concurs  Addendum:  Dr. Morrison Old dictating: The last section of the impression should read:     Multiple mildly prominent left supraclavicular and axillary lymph nodes, metastatic disease cannot be excluded.    LABS:    Lab Results   Component Value Date    WBC 6.7 07/30/2019    HGB 12.7 07/30/2019    HCT 39.1 07/30/2019    MCV 86 07/30/2019    PLT 275 07/30/2019     Lab Results   Component Value Date    NEUTROABS 3.4 07/30/2019       Chemistry        Component Value Date/Time    NA 142 07/30/2019 0910 K 3.4 (L) 07/30/2019 0910    CL 100 07/30/2019 0910    CO2 29 07/30/2019 0910    BUN 18 07/30/2019 0910    CREATININE 0.77 07/30/2019 0910    GLU 90 01/08/2019 1220        Component Value Date/Time    CALCIUM 8.9 07/30/2019 0910    ALKPHOS 77 07/30/2019 0910    AST 26 07/30/2019 0910    ALT 19 07/30/2019 0910    BILITOT 0.3 07/30/2019 0910          Lab Results   Component Value Date    ALT 19 07/30/2019    AST 26 07/30/2019    ALKPHOS 77 07/30/2019    BILITOT 0.3 07/30/2019         RADIOLOGY:  MRI 07/18/19 showed stable to slightly smaller mass.

## 2019-10-22 NOTE — Unmapped (Signed)
??   Support for cancer patients and their caregivers during the COVID-19 pandemic https://[email or web address]/news/cancer-patient-support-during-covid-19/  ?? Keeping cancer patients safe during the COVID-19 crisis https://[email or web address].[email or web address]/news/keeping-[place]-cancer-care-patients-safe-during-the-covid-19-crisis/  ?? Information on the COVID-19 vaccine  https://www.yourshot.health/  +++++++++++++++++++++++++++++++++++++++++++++++++++++++++++  Your nurse navigator is:  [name], RN, BSN, OCN   Head & Neck/Sarcoma  Oncology Nurse Navigator  [email or web address]     For appointments & questions Monday through Friday 8 AM??? 5 PM   please call [phone] or Toll free [phone].     On Nights, Weekends and Holidays  Call [phone] and ask for the adult hematologist-oncologist on call.     [facility]  [address]  [place], [place] [address]  [email or web address]  Tel: [phone] / Toll Free [phone]  Fax: [phone]  +++++++++++++++++++++++++++++++++++++++++++++++++++++++++++

## 2019-10-24 DIAGNOSIS — D481 Neoplasm of uncertain behavior of connective and other soft tissue: Principal | ICD-10-CM

## 2019-10-24 NOTE — Unmapped (Signed)
Called patient to verify allergies and medication prior to video/telephone visit. Assessed knowledged and ease of access for use of video/telephone visit.

## 2019-10-24 NOTE — Unmapped (Signed)
Called and confirmed orders were received.  Confirmation of receipt given by staff.

## 2019-10-24 NOTE — Unmapped (Signed)
Hi,     [facility] Department contacted the [employer] requesting to speak with the care team of Adessa Primiano to discuss:    Waiting for lab draw orders for patient. Please fax to [phone].    Please contact the health department at [phone].    Thank you,   Kelli Hope  [facility] Cancer Communication Center   [phone]

## 2019-11-06 NOTE — Unmapped (Signed)
Hi,    Patient Heidi Woodard called requesting a medication refill for the following:    ? Medication: ROXICODONE  ? Dosage: 5 MG immediate release tablet  ? Days left of medication: 0  ? [facility]    The expected turnaround time is 3-4 business days       Check Indicates criteria has been reviewed and confirmed with the patient:    [x]  Preferred Name   [x]  DOB and/or MR#  [x]  Preferred Contact Method  [x]  Phone Number(s)   [x]  Preferred Pharmacy   []  MyChart     Thank you,  Drema Balzarine  [facility] Cancer Communication Center  [phone]

## 2019-11-08 MED ORDER — OXYCODONE 5 MG TABLET
ORAL_TABLET | 0 refills | 0 days | Status: CP
Start: 2019-11-08 — End: ?

## 2019-11-08 NOTE — Unmapped (Signed)
Hi,    Patient Yarelly Kuba called requesting a medication refill for the following:    ? Medication: Oxycodone  ? Dosage: 5mg   ? Days left of medication: 0  ? [facility]        The expected turnaround time is 3-4 business days       Check Indicates criteria has been reviewed and confirmed with the patient:    [x]  Preferred Name   [x]  DOB and/or MR#  [x]  Preferred Contact Method  [x]  Phone Number(s)   [x]  Preferred Pharmacy   []  MyChart     Thank you,  Jacques Navy  [facility] Cancer Communication Center  [phone]

## 2019-11-08 NOTE — Unmapped (Signed)
Returned call to let pt know Dr. Loree Fee sent script in as requested. Pt will be able to get med on 11-10-19. She was appreciative of the call.

## 2019-12-03 NOTE — Unmapped (Signed)
Hi,     Allyson Tineo contacted the Communication Center requesting to speak with the care team of Kent Braunschweig to discuss:    Patient requesting an MD letter excusing absences. Patient informs me she missed work because she was too ill from chemotherapy. Could not give me dates.     Please contact [name] at [phone].      Thank you,   Kelli Hope  [facility] Cancer Communication Center   [phone]

## 2019-12-03 NOTE — Unmapped (Signed)
[facility] Triage Note     Patient: Heidi Woodard     Reason for call: return call     Time call returned: 1247     Phone Assessment: Spoke with Nicole Cella.  She does not qualify for intermittent FMLA and she will need a letter to excuse her absences from December and January due to side effects of her chemotherapy.      Spoke with Gaylyn Rong from personnel at [place], she stated the letter could be a blanket letter for December and January stating the patient is currently on treatment and that she may have absences depending on her symptoms.     Triage Recommendations: Will reach out to team for letter to be drafted and sent to [phone].     Patient Response: grateful     Outstanding tasks: Draft letter for absense excuse     Patient Pharmacy has been verified and primary pharmacy has been marked as preferred

## 2019-12-07 NOTE — Unmapped (Signed)
Hi,    Patient Heidi Woodard called requesting a medication refill for the following:    ? Medication: Urea Cream  ? Dosage: 10%  ? Days left of medication: 0  ? [facility]    The expected turnaround time is 3-4 business days       Check Indicates criteria has been reviewed and confirmed with the patient:    [x]  Preferred Name   [x]  DOB and/or MR#  [x]  Preferred Contact Method  [x]  Phone Number(s)   [x]  Preferred Pharmacy   []  MyChart     Thank you,  [name]  [facility] Cancer Communication Center  [phone]  .

## 2019-12-10 NOTE — Unmapped (Signed)
Hi,    Patient Heidi Woodard called requesting a medication refill for the following:    ? Medication: oxycodone  ? Dosage: 5 mg tablet  ? Days left of medication: 0  ? [facility]acy: [facility]      The expected turnaround time is 3-4 business days     Thank you,  Kelli Hope  [facility] Cancer Communication Center  [phone]

## 2019-12-11 MED ORDER — OXYCODONE 5 MG TABLET
ORAL_TABLET | 0 refills | 0 days | Status: CP
Start: 2019-12-11 — End: ?

## 2019-12-11 MED ORDER — UREA 10 % TOPICAL CREAM
5 refills | 0 days | Status: CP
Start: 2019-12-11 — End: ?

## 2019-12-11 MED ORDER — UREA 10 %-ALPHA HYDROXY ACIDS 4 % TOPICAL CREAM
0 days
Start: 2019-12-11 — End: ?

## 2019-12-11 NOTE — Unmapped (Signed)
error 

## 2019-12-11 NOTE — Unmapped (Signed)
Called pt to let her know refill request were sent to her pharmacy. She was appreciative of the call back.

## 2019-12-13 NOTE — Unmapped (Signed)
Called patient to let her know that [facility] would like to see her for an in-person visit. Patient has requested absence letter for work due to pain issues. In order to evaluate the best way to proceed, patient should come in for in-person appointment. Patient is willing to do this. Also, requested patient to keep a pain log from now until appointment. Include when she takes her oxy, pain leve and what she was doing prior. Ms. Hunton agreeable to this. Will request next available Tuesday appt.  According to [employer]   Employees are eligible for leave if they have worked for their employer at least 12 months, at least 1,250 hours over the past 12 months, and work at a location where [employer] 50 or more employees within 75 miles.   Patient reports she used to be full time but is now part time. Didn't ask how many hrs per week she is working.

## 2019-12-18 ENCOUNTER — Ambulatory Visit: Admit: 2019-12-18 | Discharge: 2019-12-19 | Attending: Hematology & Oncology | Primary: Hematology & Oncology

## 2019-12-18 LAB — COMPREHENSIVE METABOLIC PANEL
ALKALINE PHOSPHATASE: 69 U/L (ref 38–126)
ALT (SGPT): 22 U/L (ref <35)
AST (SGOT): 33 U/L (ref 14–38)
BILIRUBIN TOTAL: 0.4 mg/dL (ref 0.0–1.2)
BLOOD UREA NITROGEN: 21 mg/dL (ref 7–21)
BUN / CREAT RATIO: 26
CALCIUM: 9.4 mg/dL (ref 8.5–10.2)
CHLORIDE: 99 mmol/L (ref 98–107)
CO2: 31 mmol/L — ABNORMAL HIGH (ref 22.0–30.0)
CREATININE: 0.81 mg/dL (ref 0.60–1.00)
EGFR CKD-EPI AA FEMALE: >90 mL/min/{1.73_m2} (ref >=60)
EGFR CKD-EPI NON-AA FEMALE: 85 mL/min/{1.73_m2} (ref >=60)
GLUCOSE RANDOM: 92 mg/dL (ref 70–179)
POTASSIUM: 3.7 mmol/L (ref 3.5–5.0)
PROTEIN TOTAL: 7.7 g/dL (ref 6.5–8.3)
SODIUM: 137 mmol/L (ref 135–145)

## 2019-12-18 LAB — CBC W/ AUTO DIFF
BASOPHILS ABSOLUTE COUNT: 0 10*9/L (ref 0.0–0.1)
BASOPHILS RELATIVE PERCENT: 0.4 %
EOSINOPHILS ABSOLUTE COUNT: 0.2 10*9/L (ref 0.0–0.4)
EOSINOPHILS RELATIVE PERCENT: 3 %
HEMATOCRIT: 37.7 % (ref 36.0–46.0)
HEMOGLOBIN: 12.5 g/dL (ref 12.0–16.0)
LARGE UNSTAINED CELLS: 1 % (ref 0–4)
LYMPHOCYTES ABSOLUTE COUNT: 3.3 10*9/L (ref 1.5–5.0)
LYMPHOCYTES RELATIVE PERCENT: 42.2 %
MEAN CORPUSCULAR HEMOGLOBIN CONC: 33.1 g/dL (ref 31.0–37.0)
MEAN CORPUSCULAR HEMOGLOBIN: 29.2 pg (ref 26.0–34.0)
MEAN CORPUSCULAR VOLUME: 88.2 fL (ref 80.0–100.0)
MEAN PLATELET VOLUME: 9.3 fL (ref 7.0–10.0)
MONOCYTES ABSOLUTE COUNT: 0.4 10*9/L (ref 0.2–0.8)
MONOCYTES RELATIVE PERCENT: 5.3 %
NEUTROPHILS ABSOLUTE COUNT: 3.7 10*9/L (ref 2.0–7.5)
NEUTROPHILS RELATIVE PERCENT: 47.9 %
RED BLOOD CELL COUNT: 4.28 10*12/L (ref 4.00–5.20)
WBC ADJUSTED: 7.7 10*9/L (ref 4.5–11.0)

## 2019-12-18 LAB — BUN / CREAT RATIO: Urea nitrogen/Creatinine:MRto:Pt:Ser/Plas:Qn:: 26

## 2019-12-18 LAB — RED BLOOD CELL COUNT: Lab: 4.28

## 2019-12-18 NOTE — Unmapped (Signed)
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 Continue prn 1% hydrocort cream to face(OTC), triamcinolone cream to body, sunscreen or avoidance, and to call if she has recurrent issues. PRN benadryl for itching with rash/flaking skin. Emolliants after bathing (cocoa butter ok).  Cornstarch or baby powder to skin folds to minimize chafing.   - Continue urea cream on callouses on feet and also along neck.   - MRI Left shoulder, 07/18/19, Continued decrease in edema and enhancement of the large fibrotic mass posterior to the left shoulder with unchanged to slightly decreased size.  - Pt states she has 100% compliance with sorafenib, no issues with refills or copays.    - Labs drawn today adequate to continue therapy at 200mg  daily     Desmoid tumor associated pain -  - Reports taking 1 - 2 tabs when not working and 3-4 tabs when working oxycodone 5 mg per day with good pain control.  She reports pain is OK overall when not working but is exacerbated significantly by working.  Has not tried lidoderm patches.  - Today, we wrote a letter describing her lifting limitations. We also empowered her to discuss her limitations with her supervisor in hopes of coming up with an alternative job or at least not give her tasks with such weight. Pending discussion with her supervisor, she can consider applying for disability (though we did note that this is a long process).   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 Continue aggressive emollient use to prevent dry skin.    Birth control: Says she is not sexually active. Previously discussed teratogenic risk of pregnancy on sorafenib.    Hypokalemia  - on oral K per PCP.  Has f/u labs this week.      Dispo: RTC in one month with MRI beforehand to reassess desmoid tumor    Seen and discussed with attending Dr. Neta Ehlers D. Rozell Searing, MD  PGY-4 - Hematology/Oncology      REASON FOR CONSULTATION:   Heidi Woodard is a 49 y.o. female who is seen in follow up for her desmoid fibromatosis.      HISTORY OF PRESENT ILLNESS:    About a year ago, pt developed shoulder pain associated with a fall. There was bruising and swelling but she attributed it to the injury. It never improved, and this summer she noted increased swelling and decreased ROM (which is limited by pain, rather than painless weakness). 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 She underwent CT of the left shoudler on 06/25/16 which showed a 16 cm mass involing the left deltoid with likely invasion of the left trapezius, triceps, and teres minor. Possible LN involvement of axillary and supraclav LN on left were present. On 07/07/2016  she saw Dr. Darral Dash and ultrasound-guided biopsy was performed with pathology revealing low-grade spindle cell lesion favoring fibromatosis. He felt that surgical resection would be highly morbid. On 9/20/017, she saw Dr Beckey Rutter, who did not favor RT due to inherent risks associated with RT.     She has ongoing pain, some tingling along the backs of her hands. Pain is intermittent if her arm is at rest, happening about every other day but when it happens it is very intense. Per above it limits her ADLs although she says she is managing.      Interval history:     Continues on sorafenib 200mg  daily. Says she has not missed any doses for any reason.    Her main compliant today is that she feels her pain is not well controlled when at work. She helps to Engineer, agricultural at [employer]; though she is trying to not lift if she doesn't have to, she still is lifting up to 20# regularly. Notes that when working (works 5-6 hour shifts 4x weekly) she needs to take extra oxycodone for the pain. Says that she is beat when she gets home and feels tired the rest of the day. Her left shoulder pain is similar to what she has had nothing new. She requests letter and wanted to discuss disability today.   No constipation.     Skin -  Still with some cracking around her feet. Has not been using the urea cream religiously nor using emollients as much as she should.     REVIEW OF SYSTEMS:  A comprehensive review of 14 systems was negative except for pertinent positives noted in HPI.    PMH: denies    PSH: denies    No family history on file.     Social History     Occupational History   ??? Not on file   Tobacco Use   ??? Smoking status: Never Smoker   ??? Smokeless tobacco: Never Used   Substance and Sexual Activity   ??? Alcohol use: No   ??? Drug use: No   ??? Sexual activity: Not on file       ALLERGIES/MEDICATIONS:  Reviewed in EPIC    PHYSICAL EXAM:   General: in NAD, pleasant and conversant  HEENT: EOMI, PERRL, MMM  Neck: no thyromegaly, no cervical LAD  Cardio: normal rate, regular rhythm, S1S2 w/o M/R/G, no JVD  Resp: CTA b/l, no increased WOB  Abd: soft, NTND, +BS, no HSM  Extr: warm, no lower extremity edema  MSK: palpable 5-6cm mass on L shoulder with mild pain to the touch, full ROM (Though left with slightly more difficulty)  Neuro: alert and oriented, no focal deficits  Psych: appropriate mood and affect    +++++++++++++++++++++++++++++++++++++++++++++++++++++++++++     PATHOLOGY:   Pathology was personally reviewed as described in the HPI, detailed below.   07/07/2016  Surgical pathology exam  Final Diagnosis   A: Shoulder, left, biopsy - Low grade spindle cell lesion, favor fibromatosis  - The lesional cells are positive for beta-catenin, weakly positive for smooth muscle actin and desmin, and negative for S100, supporting the interpretation of fibromatosis  - Dr. Pollie Friar concurs  Addendum:  Dr. Morrison Old dictating: The last section of the impression should read:     Multiple mildly prominent left supraclavicular and axillary lymph nodes, metastatic disease cannot be excluded.    LABS:    Lab Results   Component Value Date    WBC 6.7 07/30/2019  HGB 12.7 07/30/2019    HCT 39.1 07/30/2019    MCV 86 07/30/2019    PLT 275 07/30/2019     Lab Results   Component Value Date    NEUTROABS 3.4 07/30/2019       Chemistry        Component Value Date/Time    NA 142 07/30/2019 0910    K 3.4 (L) 07/30/2019 0910    CL 100 07/30/2019 0910    CO2 29 07/30/2019 0910    BUN 18 07/30/2019 0910    CREATININE 0.77 07/30/2019 0910    GLU 90 01/08/2019 1220        Component Value Date/Time    CALCIUM 8.9 07/30/2019 0910    ALKPHOS 77 07/30/2019 0910    AST 26 07/30/2019 0910    ALT 19 07/30/2019 0910    BILITOT 0.3 07/30/2019 0910          Lab Results   Component Value Date    ALT 19 07/30/2019    AST 26 07/30/2019    ALKPHOS 77 07/30/2019    BILITOT 0.3 07/30/2019         RADIOLOGY:  MRI 07/18/19 showed stable to slightly smaller mass.

## 2019-12-19 NOTE — Unmapped (Signed)
+++++++++++++++++++++++++++++++++++++++++++++++++++++++++++  ; ???????????????  Support for cancer patients and their caregivers during the COVID-19 pandemic [email or web address]  ; Keeping cancer patients safe during the COVID-19 crisis [email or web address]://[email or web address]-the-[email or web address]-[email or web address]-crisis/  +++++++++++++++++++++++++++++++++++++++++++++++++++++++++++  VACCINE DISTRIBUTION  This information is subject to change and was last updated on 11/15/19.  (Active) Group 1: Health care workers & Long-Term Care staff and residents  ; Health care workers with in-person patient contact  ; Long-term care staff and residents???people in skilled nursing facilities, adult care homes and continuing care retirement communities     (Active) Group 2: Older adults  ; Anyone 65 years or older, regardless of health status or living situation     Group 3: Frontline essential workers  ; The CDC defines frontline essential workers as workers who are in sectors essential to the functioning of society and who are at substantially higher risk for exposure to COVID-19     Group 4: Adults at high risk for exposure and increased risk of severe illness  ; Anyone 63-59 years old with high-risk medical conditions that increase risk of severe disease from COVID-19 such as cancer, COPD, serious heart conditions, sickle cell disease, Type 2 diabetes, among others, regardless of living situation  ; Anyone who is incarcerated or living in other close group living settings who is not already vaccinated due to age, medical condition or job function  ; Essential workers not yet vaccinated. The CDC defines these as workers in Interior and spatial designer, Building services engineer, Personnel officer, shelter and housing (e.g., Holiday representative), finance (e.g., bank tellers), Automotive engineer, energy, legal, media, public safety (e.g., engineers) and public health workers Group 5: Everyone who wants a safe and effective COVID-19 vaccination     If you are currently eligible to receive a vaccine:  You must schedule an appointment to receive a vaccine. You may not get a vaccine without an appointment. No identification is required. Walk-ins are not accepted.  Schedule an appointment online or call [phone] (M-F 8am-5pm).  For more information on how to get a vaccine, visit Get Vaccinated.  [email or web address]   +++++++++++++++++++++++++++++++++++++++++++++++++++++++++++  Your nurse navigator is:  [name], RN, BSN, OCN   Head & Neck/Sarcoma  Oncology Nurse Navigator  [email or web address]    For appointments & questions Monday through Friday 8 AM??? 5 PM   please call [phone] or Toll free [phone].    On Nights, Weekends and Holidays  Call [phone] and ask for the adult hematologist-oncologist on call.    [facility]  [address]  [place], [place] [address]  [email or web address]  Tel: [phone] / Toll Free [phone]  Fax: [phone]  +++++++++++++++++++++++++++++++++++++++++++++++++++++++++++

## 2019-12-28 MED ORDER — SIMVASTATIN 40 MG TABLET
Freq: Every day | ORAL | 0 days
Start: 2019-12-28 — End: ?

## 2019-12-31 NOTE — Unmapped (Signed)
Hi,    Patient Heidi Woodard called requesting a medication refill for the following:    ? Medication: oxycodone  ? Dosage: 5 mg   ? Days left of medication: 2  ? Pharmacy: [facility] in [place]         The expected turnaround time is 3-4 business days     Thank you,  Kelli Hope  [facility] Communication [facility]  [phone]

## 2020-01-02 NOTE — Unmapped (Signed)
Hi,     Heidi Woodard contacted the [employer] regarding the following:    - Following up on Medication Refill request.    Please contact at [phone].    Thanks in advance,    [name]  [facility] Cancer Communication Center   [phone]

## 2020-01-07 MED ORDER — OXYCODONE 5 MG TABLET
ORAL_TABLET | 0 refills | 0 days | Status: CP
Start: 2020-01-07 — End: ?

## 2020-01-07 NOTE — Unmapped (Signed)
Phoned pt and advised her she can pick up the refill tomorrow.

## 2020-01-08 NOTE — Unmapped (Signed)
Pt states Chris from HR needs a letter to cover days pt missed in order for pt not to lose her job. Thayer Ohm can be reached at [phone].     Phoned the number provided and was informed Thayer Ohm leaves at 2:50 PM. Left a message for her requesting that she call [phone] and provide the dates pt missed work due to illness for which she needs a Physicist, medical.

## 2020-01-08 NOTE — Unmapped (Signed)
Hi,     AASHKA SALOMONE contacted the [employer] requesting to speak with the care team of Heidi Woodard to discuss:    Following up on a note Dr. Loree Fee signed for patient for [facility]. Her HR department states the letter does not cover all of her time off.    Please contact [name] at [phone].    Thank you,   Kelli Hope  [facility] Cancer Communication Center   [phone]

## 2020-01-15 NOTE — Unmapped (Signed)
Left message for Heidi Woodard, at [place]. Other managers were not able to assist. Heidi Woodard will call me on Thursday and let me know exactly what Heidi Woodard needs in terms of a letter for work.

## 2020-01-24 NOTE — Unmapped (Signed)
Reason for call: Vaccine Inquiry    Patient called in with questions regarding COVID vaccination.  The caller verbalized understanding of the information reviewed and has no further questions at this time vaccine eligibility with current treatment.     The below information was reviewed with the caller.  The caller verbalized understanding and had no further questions at this time.     INFORMATION REVIEWED    Resources:  ? Patient has provided resources to stay informed on the latest COVID-19 vaccine information and updates.  ? [email or web address] website   ? [email or web address]     Guidelines:   ? Health care workers with in-person patient contact  ? Long-term care staff and residents???people in skilled nursing facilities, adult care homes and continuing care retirement communities  ? Anyone 65 years or older, regardless of health status or living situation  ? Frontline Essential Workers - child care and [employer] school workers (Feb. 24), followed by additional frontline essential workers on March 3. * On 01/16/20 Group 4 is eligible: Includes anyone 57-38 years old with one or more high-risk medical conditions for severe disease from COVID-19, people living in close group settings and essential workers. (Please note these are different from Group 3 frontline essential workers.)     Scheduling:  ? Vaccine supply is very limited. If you are? currently eligible, you must schedule an appointment to receive a vaccine. Walk-ins are not accepted.  ? Please be aware that appointments will become available at clinics across the [facility] system throughout [place].  ?  If you are eligible, you may make an appointment at any clinic location, but please pay special attention to the city/location and be aware it may not be close to where you live.  ? Please expect longer than normal times wait times for schedule by phone.  ?  For faster scheduling, please schedule online.  ? Appointment by phone can be made by calling [phone].  ? Appointments online can be made through [email or web address] website   ? If you are experiencing issues scheduling online, please call [phone] (M-F, 8am???5pm).   ? The Oncology team is unable to schedule or reschedule any vaccine appointments???all requests for vaccine appointments must go through the number and online site provided.     Cost:  ? The vaccine is free to all patients, but if the patient has insurance insured please bring the insurance card to your appointment.   ? Photo ID is not required to get vaccinated      Thank you,   [name]  [facility] Cancer Communication Center   [phone]

## 2020-01-25 NOTE — Unmapped (Signed)
Returned patient call re covid 19 vax questions. Patient would like to get the vaccine and wanted to make sure that the nexavar wouldn't interfere with the vaccine and vice versa. Informed patient per our [facility] policy, we do recommend all of our patients receive the vaccine even if they are currently on treatment, and based on review of her chart she is fine to proceed with getting the vaccine. Provided local resources for pt call: [place]  COVID-19 vaccine scheduling: [email or web address] or to call our scheduling team phone number [phone], also provided number to [facility] [employer] [phone]. Patient appreciative, has no further questions at this time.   Time on call: 4 min

## 2020-01-29 ENCOUNTER — Ambulatory Visit
Admit: 2020-01-29 | Discharge: 2020-01-30 | Payer: BLUE CROSS/BLUE SHIELD | Attending: Hematology & Oncology | Primary: Hematology & Oncology

## 2020-01-29 ENCOUNTER — Other Ambulatory Visit: Admit: 2020-01-29 | Discharge: 2020-01-30

## 2020-01-29 DIAGNOSIS — M25512 Pain in left shoulder: Principal | ICD-10-CM

## 2020-01-29 DIAGNOSIS — G8929 Other chronic pain: Secondary | ICD-10-CM

## 2020-01-29 DIAGNOSIS — L98491 Non-pressure chronic ulcer of skin of other sites limited to breakdown of skin: Principal | ICD-10-CM

## 2020-01-29 DIAGNOSIS — R21 Rash and other nonspecific skin eruption: Principal | ICD-10-CM

## 2020-01-29 DIAGNOSIS — D481 Neoplasm of uncertain behavior of connective and other soft tissue: Principal | ICD-10-CM

## 2020-01-29 LAB — COMPREHENSIVE METABOLIC PANEL
ALBUMIN: 4.1 g/dL (ref 3.5–5.0)
ALKALINE PHOSPHATASE: 85 U/L (ref 38–126)
ALT (SGPT): 24 U/L (ref <35)
ANION GAP: 5 mmol/L — ABNORMAL LOW (ref 7–15)
AST (SGOT): 36 U/L (ref 14–38)
BLOOD UREA NITROGEN: 20 mg/dL (ref 7–21)
BUN / CREAT RATIO: 28
CALCIUM: 8.5 mg/dL (ref 8.5–10.2)
CHLORIDE: 103 mmol/L (ref 98–107)
CO2: 34 mmol/L — ABNORMAL HIGH (ref 22.0–30.0)
CREATININE: 0.72 mg/dL (ref 0.60–1.00)
EGFR CKD-EPI AA FEMALE: >90 mL/min/{1.73_m2} (ref >=60)
EGFR CKD-EPI NON-AA FEMALE: >90 mL/min/{1.73_m2} (ref >=60)
GLUCOSE RANDOM: 105 mg/dL (ref 70–179)
POTASSIUM: 3.9 mmol/L (ref 3.5–5.0)
SODIUM: 142 mmol/L (ref 135–145)

## 2020-01-29 LAB — CBC W/ AUTO DIFF
BASOPHILS ABSOLUTE COUNT: 0 10*9/L (ref 0.0–0.1)
BASOPHILS RELATIVE PERCENT: 0.5 %
EOSINOPHILS ABSOLUTE COUNT: 0.1 10*9/L (ref 0.0–0.4)
EOSINOPHILS RELATIVE PERCENT: 1.8 %
HEMATOCRIT: 42.7 % (ref 36.0–46.0)
HEMOGLOBIN: 13.3 g/dL (ref 12.0–16.0)
LARGE UNSTAINED CELLS: 1 % (ref 0–4)
LYMPHOCYTES ABSOLUTE COUNT: 2.3 10*9/L (ref 1.5–5.0)
LYMPHOCYTES RELATIVE PERCENT: 31.3 %
MEAN CORPUSCULAR HEMOGLOBIN CONC: 31.1 g/dL (ref 31.0–37.0)
MEAN CORPUSCULAR VOLUME: 94 fL (ref 80.0–100.0)
MONOCYTES ABSOLUTE COUNT: 0.3 10*9/L (ref 0.2–0.8)
MONOCYTES RELATIVE PERCENT: 4.3 %
NEUTROPHILS ABSOLUTE COUNT: 4.4 10*9/L (ref 2.0–7.5)
NEUTROPHILS RELATIVE PERCENT: 61 %
PLATELET COUNT: 238 10*9/L (ref 150–440)
RED BLOOD CELL COUNT: 4.54 10*12/L (ref 4.00–5.20)
RED CELL DISTRIBUTION WIDTH: 14.5 % (ref 12.0–15.0)
WBC ADJUSTED: 7.3 10*9/L (ref 4.5–11.0)

## 2020-01-29 LAB — NEUTROPHILS ABSOLUTE COUNT: Neutrophils:NCnc:Pt:Bld:Qn:Automated count: 4.4

## 2020-01-29 LAB — SMEAR REVIEW: Lab: 0

## 2020-01-29 LAB — EGFR CKD-EPI NON-AA FEMALE
Glomerular filtration rate/1.73 sq M.predicted.non black:ArVRat:Pt:Ser/Plas/Bld:Qn:Creatinine-based formula (CKD-EPI): >90

## 2020-01-29 MED ORDER — UREA 10 % TOPICAL CREAM
5 refills | 0 days | Status: CP
Start: 2020-01-29 — End: ?

## 2020-01-29 NOTE — Unmapped (Signed)
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 Continue prn 1% hydrocort cream to face(OTC), triamcinolone cream to body, sunscreen or avoidance, and to call if she has recurrent issues. PRN benadryl for itching with rash/flaking skin. Emolliants after bathing (cocoa butter ok).  Cornstarch or baby powder to skin folds to minimize chafing.   - Continue urea cream on callouses on feet and also along neck.   - MRI Left shoulder, 07/18/19, Continued decrease in edema and enhancement of the large fibrotic mass posterior to the left shoulder with unchanged to slightly decreased size.  - Pt states she has 100% compliance with sorafenib, no issues with refills or copays.    - Labs drawn today adequate to continue therapy at 200mg  daily  - f/u MRI per below. Discussed possible options:   - doxil   - ongoing sorafenib (I dont think she would tolerate dose escalation)   - observation     Desmoid tumor associated pain -  - Reports taking 1 - 2 tabs when not working and 3-4 tabs when working oxycodone 5 mg per day with good pain control.  She reports pain is OK overall when not working but is exacerbated significantly by working.  Has not tried lidoderm patches.  - Today, we wrote a letter describing her lifting limitations. We also empowered her to discuss her limitations with her supervisor in hopes of coming up with an alternative job or at least not give her tasks with such weight. Pending discussion with her supervisor, she can consider applying for disability (though we did note that this is a long process).   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 Continue aggressive emollient use to prevent dry skin.    Birth control: Says she is not sexually active. Previously discussed teratogenic risk of pregnancy on sorafenib.    Hypokalemia  - on oral K per PCP.  Has f/u labs this week.      Dispo: RTC after MRI to reassess desmoid tumor          REASON FOR CONSULTATION:   Heidi Woodard is a 49 y.o. female who is seen in follow up for her desmoid fibromatosis.      HISTORY OF PRESENT ILLNESS:    About a year ago, pt developed shoulder pain associated with a fall. There was bruising and swelling but she attributed it to the injury. It never improved, and this summer she noted increased swelling and decreased ROM (which is limited by pain, rather than painless weakness). 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 She underwent CT of the left shoudler on 06/25/16 which showed a 16 cm mass involing the left deltoid with likely invasion of the left trapezius, triceps, and teres minor. Possible LN involvement of  axillary and supraclav LN on left were present. On 07/07/2016 she saw Dr. Darral Dash and ultrasound-guided biopsy was performed with pathology revealing low-grade spindle cell lesion favoring fibromatosis. He felt that surgical resection would be highly morbid. On 9/20/017, she saw Dr Beckey Rutter, who did not favor RT due to inherent risks associated with RT.     She has ongoing pain, some tingling along the backs of her hands. Pain is intermittent if her arm is at rest, happening about every other day but when it happens it is very intense. Per above it limits her ADLs although she says she is managing.      Interval history:     Continues on sorafenib 200mg  nightly. Says she has not missed any doses for any reason.    Here for routine f/u  Appears as if her MRI got rescheduled for 02/08/20 bc machine was broken  Using urea cream nightly on feet  Shoulder feels good some days and other days in more pain. Sometimes waking up in middle of the night. This has been going on for gradually worse over time.     REVIEW OF SYSTEMS:  A comprehensive review of 14 systems was negative except for pertinent positives noted in HPI.    PMH: denies    PSH: denies    FHx: no fhx of FAP or desmoid tumors     Social History     Occupational History   ??? Not on file   Tobacco Use   ??? Smoking status: Never Smoker   ??? Smokeless tobacco: Never Used   Substance and Sexual Activity   ??? Alcohol use: No   ??? Drug use: No   ??? Sexual activity: Not on file       ALLERGIES/MEDICATIONS:  Reviewed in EPIC    PHYSICAL EXAM:   General: in NAD, pleasant and conversant  VS: reviewed in chart  HEENT: EOMI, MMM  Neck: no thyromegaly, no cervical LAD  Cardio: RRR  Resp: CTA b/l, no increased WOB  Abd: soft, NTND, +BS, no HSM  Extr: warm, no lower extremity edema  MSK: palpable 5-6cm mass on L shoulder with mild pain to the touch, full ROM (Though left with slightly more difficulty)  Skin: no rash/blisters/HFS  Neuro: alert and oriented, no focal deficits  Psych: appropriate mood and affect    +++++++++++++++++++++++++++++++++++++++++++++++++++++++++++     PATHOLOGY:   Pathology was personally reviewed as described in the HPI, detailed below.   07/07/2016  Surgical pathology exam  Final Diagnosis   A: Shoulder, left, biopsy   - Low grade spindle cell lesion, favor fibromatosis  - The lesional cells are positive for beta-catenin, weakly positive for smooth muscle actin and desmin, and negative for S100, supporting the interpretation of fibromatosis  - Dr. Pollie Friar concurs  Addendum:  Dr. Morrison Old dictating: The last section of the impression should read:     Multiple mildly prominent left supraclavicular and axillary lymph nodes, metastatic disease cannot be excluded.    LABS:    Reviewed in epic    RADIOLOGY:  MRI 07/18/19 showed stable to slightly smaller mass.

## 2020-01-29 NOTE — Unmapped (Signed)
?? Support for cancer patients and their caregivers during the COVID-19 pandemic [email or web address]  ?? Keeping cancer patients safe during the COVID-19 crisis [email or web address]  +++++++++++++++++++++++++++++++++++++++++++++++++++++++++++  VACCINE DISTRIBUTION  This information is subject to change and was last updated on 11/15/19.  (Active) Group 1: Health care workers & Long-Term Care staff and residents  ?? Health care workers with in-person patient contact  ?? Long-term care staff and residents???people in skilled nursing facilities, adult care homes and continuing care retirement communities     (Active) Group 2: Older adults  ?? Anyone 65 years or older, regardless of health status or living situation     Group 3: Frontline essential workers  ?? The CDC defines frontline essential workers as workers who are in sectors essential to the functioning of society and who are at substantially higher risk for exposure to COVID-19     Group 4: Adults at high risk for exposure and increased risk of severe illness  ?? Anyone 9-60 years old with high-risk medical conditions that increase risk of severe disease from COVID-19 such as cancer, COPD, serious heart conditions, sickle cell disease, Type 2 diabetes, among others, regardless of living situation  ?? Anyone who is incarcerated or living in other close group living settings who is not already vaccinated due to age, medical condition or job function  ?? Essential workers not yet vaccinated. The CDC defines these as workers in Interior and spatial designer, Building services engineer, Personnel officer, shelter and housing (e.g., Holiday representative), finance (e.g., bank tellers), Automotive engineer, energy, legal, media, public safety (e.g., engineers) and public health workers     Group 5: Everyone who wants a safe and effective COVID-19 vaccination     If you are currently eligible to receive a vaccine:  You must schedule an appointment to receive a vaccine. You may not get a vaccine without an appointment. No identification is required. Walk-ins are not accepted.  Schedule an appointment online or call [phone] (M-F 8am-5pm).  For more information on how to get a vaccine, visit Get Vaccinated.  [email or web address]   +++++++++++++++++++++++++++++++++++++++++++++++++++++++++++      WHAT YOU SHOULD KNOW ABOUT THE COVID-19 VACCINES   People with cancer are at an increased risk for sickness and death from COVID-19. We recommend patients with cancer, including those who are receiving cancer treatment, be vaccinated as soon as they are eligible.   Know your platelet count. Some patients with specific cancers or those receiving certain treatments may have a low platelet count (part of the blood that helps with clotting). If this is you, talk with your care team prior to getting vaccinated.   Bone marrow transplant patients should wait until at least 3 months after transplant. Patients who have undergone a stem cell transplant (SCT) or Car-T/Cellular Therapy (CT) will need to wait at least 3 months after their transplant to receive a vaccine.   The vaccines work.   Scientists found the vaccines could help prevent people from getting sick with COVID-19.   The vaccines are safe. The COVID-19 vaccines went through every stage of clinical trials. More than 70,000 people of all different races and ethnicities participated in the COVID-19 vaccine trials. Health professionals will continue to monitor the vaccines' safety and effectiveness against the virus.   Very few people experience serious side effects. Just like other vaccines, the COVID-19 vaccines can cause minor side effects. If you are worried about potential side effects, contact your doctor.   You will need two doses of the vaccine.  This will help your body build up a strong resistance to COVID-19. You will get your second dose three to four weeks after your first one.   Some people should talk to their doctor first. People who have serious allergies and/or are pregnant, breastfeeding or immunocompromised should talk with their doctor before receiving a COVID-19 vaccine.   If you have had COVID-19, you should still get the vaccines. It is possible to get COVID-19 more than once and the vaccines will help to prevent you from getting sick again.   Please continue to wear a mask and practice physical distancing. Even after you get a COVID-19 vaccine, you should continue to wear a mask, wash your hands regularly and stay 6 feet apart. Thank you for helping protect yourself and your community!   +++++++++++++++++++++++++++++++++++++++++++++++++++++++++++  Your nurse navigator is:  [name], RN, BSN, OCN   Head & Neck/Sarcoma  Oncology Nurse Navigator  [email or web address]     For appointments & questions Monday through Friday 8 AM??? 5 PM   please call [phone] or Toll free [phone].     On Nights, Weekends and Holidays  Call [phone] and ask for the adult hematologist-oncologist on call.     [facility]  [address]  [place], [place] [address]  [email or web address]  Tel: [phone] / Toll Free [phone]  Fax: [phone]

## 2020-01-29 NOTE — Unmapped (Signed)
Labs collected via #23 butterfly & sent for analysis.  To next appt.  Care provided by Elyn Peers RN.

## 2020-02-04 NOTE — Unmapped (Signed)
Hi,     Heidi Woodard contacted the Communication Center requesting to speak with the care team of Heidi Woodard to discuss:    Patient has questions about her 02/08/20 MRI.    Please contact [name] at [phone]..      Thank you,   Kelli Hope  [place] [facility]   [phone]

## 2020-02-04 NOTE — Unmapped (Signed)
Returned call to pt. She states she has been experiencing pain in right shoulder in the same area as her left shoulder where her tumor is over the past 3 days.She is scheduled to have MRI left upper extremity this Friday and wanted to see if MRI on the right extremity could be done R/T new onset of pain in the right.She is very concerned and would like to make sure nothing new is going on in right shoulder.     Told her I would notify team for their recommendation.

## 2020-02-07 NOTE — Unmapped (Signed)
Called pt to let her know Dr. Loree Fee doubts new MRI order for Rt shoulder could be approved for Friday and she does not want to risk Medicaid canceling the primary side(left).She would like pt to see her PCP for eval first.     Dr.Grilley-Olson has notified her scheduler to see if adding on right would be a problem. Will let pt know if it can be arranged.

## 2020-02-08 ENCOUNTER — Ambulatory Visit: Admit: 2020-02-08 | Discharge: 2020-02-09 | Payer: BLUE CROSS/BLUE SHIELD

## 2020-02-08 MED ORDER — OXYCODONE 5 MG TABLET
ORAL_TABLET | 0 refills | 0 days | Status: CP
Start: 2020-02-08 — End: ?

## 2020-02-11 ENCOUNTER — Telehealth
Admit: 2020-02-11 | Discharge: 2020-02-12 | Payer: BLUE CROSS/BLUE SHIELD | Attending: Hematology & Oncology | Primary: Hematology & Oncology

## 2020-02-11 DIAGNOSIS — G8929 Other chronic pain: Principal | ICD-10-CM

## 2020-02-11 DIAGNOSIS — M25512 Pain in left shoulder: Principal | ICD-10-CM

## 2020-02-11 DIAGNOSIS — D481 Neoplasm of uncertain behavior of connective and other soft tissue: Principal | ICD-10-CM

## 2020-02-11 DIAGNOSIS — R21 Rash and other nonspecific skin eruption: Principal | ICD-10-CM

## 2020-02-11 DIAGNOSIS — L98491 Non-pressure chronic ulcer of skin of other sites limited to breakdown of skin: Principal | ICD-10-CM

## 2020-02-11 NOTE — Unmapped (Signed)
?? Support for cancer patients and their caregivers during the COVID-19 pandemic [email or web address]  ?? Keeping cancer patients safe during the COVID-19 crisis [email or web address]  +++++++++++++++++++++++++++++++++++++++++++++++++++++++++++  VACCINE DISTRIBUTION  This information is subject to change and was last updated on 11/15/19.  (Active) Group 1: Health care workers & Long-Term Care staff and residents  ?? Health care workers with in-person patient contact  ?? Long-term care staff and residents???people in skilled nursing facilities, adult care homes and continuing care retirement communities     (Active) Group 2: Older adults  ?? Anyone 65 years or older, regardless of health status or living situation     Group 3: Frontline essential workers  ?? The CDC defines frontline essential workers as workers who are in sectors essential to the functioning of society and who are at substantially higher risk for exposure to COVID-19     Group 4: Adults at high risk for exposure and increased risk of severe illness  ?? Anyone 9-60 years old with high-risk medical conditions that increase risk of severe disease from COVID-19 such as cancer, COPD, serious heart conditions, sickle cell disease, Type 2 diabetes, among others, regardless of living situation  ?? Anyone who is incarcerated or living in other close group living settings who is not already vaccinated due to age, medical condition or job function  ?? Essential workers not yet vaccinated. The CDC defines these as workers in Interior and spatial designer, Building services engineer, Personnel officer, shelter and housing (e.g., Holiday representative), finance (e.g., bank tellers), Automotive engineer, energy, legal, media, public safety (e.g., engineers) and public health workers     Group 5: Everyone who wants a safe and effective COVID-19 vaccination     If you are currently eligible to receive a vaccine:  You must schedule an appointment to receive a vaccine. You may not get a vaccine without an appointment. No identification is required. Walk-ins are not accepted.  Schedule an appointment online or call [phone] (M-F 8am-5pm).  For more information on how to get a vaccine, visit Get Vaccinated.  [email or web address]   +++++++++++++++++++++++++++++++++++++++++++++++++++++++++++      WHAT YOU SHOULD KNOW ABOUT THE COVID-19 VACCINES   People with cancer are at an increased risk for sickness and death from COVID-19. We recommend patients with cancer, including those who are receiving cancer treatment, be vaccinated as soon as they are eligible.   Know your platelet count. Some patients with specific cancers or those receiving certain treatments may have a low platelet count (part of the blood that helps with clotting). If this is you, talk with your care team prior to getting vaccinated.   Bone marrow transplant patients should wait until at least 3 months after transplant. Patients who have undergone a stem cell transplant (SCT) or Car-T/Cellular Therapy (CT) will need to wait at least 3 months after their transplant to receive a vaccine.   The vaccines work.   Scientists found the vaccines could help prevent people from getting sick with COVID-19.   The vaccines are safe. The COVID-19 vaccines went through every stage of clinical trials. More than 70,000 people of all different races and ethnicities participated in the COVID-19 vaccine trials. Health professionals will continue to monitor the vaccines' safety and effectiveness against the virus.   Very few people experience serious side effects. Just like other vaccines, the COVID-19 vaccines can cause minor side effects. If you are worried about potential side effects, contact your doctor.   You will need two doses of the vaccine.  This will help your body build up a strong resistance to COVID-19. You will get your second dose three to four weeks after your first one.   Some people should talk to their doctor first. People who have serious allergies and/or are pregnant, breastfeeding or immunocompromised should talk with their doctor before receiving a COVID-19 vaccine.   If you have had COVID-19, you should still get the vaccines. It is possible to get COVID-19 more than once and the vaccines will help to prevent you from getting sick again.   Please continue to wear a mask and practice physical distancing. Even after you get a COVID-19 vaccine, you should continue to wear a mask, wash your hands regularly and stay 6 feet apart. Thank you for helping protect yourself and your community!   +++++++++++++++++++++++++++++++++++++++++++++++++++++++++++  Your nurse navigator is:  [name], RN, BSN, OCN   Head & Neck/Sarcoma  Oncology Nurse Navigator  [email or web address]     For appointments & questions Monday through Friday 8 AM??? 5 PM   please call [phone] or Toll free [phone].     On Nights, Weekends and Holidays  Call [phone] and ask for the adult hematologist-oncologist on call.     [facility]  [address]  [place], [place] [address]  [email or web address]  Tel: [phone] / Toll Free [phone]  Fax: [phone]

## 2020-02-11 NOTE — Unmapped (Addendum)
SARCOMA ONCOLOGY FOLLOW UP VISIT    Encounter Date: 02/11/2020  Patient Name: Heidi Woodard  Medical Record Number: [id number]    Referring Physician: Dr Edilia Bo, Ortho onc  Primary Care Provider: [facility] Department     +++++++++++++++++++++++++++++++++++++++++++++++++++++++++++  I spent 16 minutes on the real-time audio and video with the patient on the date of service. I spent an additional 20 minutes on pre- and post-visit activities on the date of service.     The patient was physically located in [place] or a state in which I am permitted to provide care. The patient and/or parent/guardian understood that s/he may incur co-pays and cost sharing, and agreed to the telemedicine visit. The visit was reasonable and appropriate under the circumstances given the patient's presentation at the time.    The patient and/or parent/guardian has been advised of the potential risks and limitations of this mode of treatment (including, but not limited to, the absence of in-person examination) and has agreed to be treated using telemedicine. The patient's/patient's family's questions regarding telemedicine have been answered.     If the visit was completed in an ambulatory setting, the patient and/or parent/guardian has also been advised to contact their provider???s office for worsening conditions, and seek emergency medical treatment and/or call 911 if the patient deems either necessary.    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 Until she decides, continue sorafenib 200mg  po qhs (dose reduced d/t rash). Continue prn 1% hydrocort cream to face(OTC), triamcinolone cream to body, sunscreen or avoidance, and to call if she has recurrent issues. PRN benadryl for itching with rash/flaking skin. Emolliants after bathing (cocoa butter ok).  Cornstarch or baby powder to skin folds to minimize chafing.   - Continue urea cream on callouses on feet and also along neck.   - Pt states she has 100% compliance with sorafenib, no issues with refills or copays.    - Labs drawn today adequate to continue therapy at 200mg  daily  - f/u MRI per below. Discussed possible options:   - doxil   - ongoing sorafenib (I dont think she would tolerate dose escalation)   - observation     Desmoid tumor associated pain -  - Reports taking 1 - 2 tabs when not working and 3-4 tabs when working oxycodone 5 mg per day with good pain control.  She reports pain is OK overall when not working but is exacerbated significantly by working.  Has not tried lidoderm patches.  - March 2021, we wrote a letter describing her lifting limitations. We also empowered her to discuss her limitations with her supervisor in hopes of coming up with an alternative job or at least not give her tasks with such weight. Pending discussion with her supervisor, she can consider applying for disability (though we did note that this is a long process).   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 Continue aggressive emollient use to prevent dry skin.    Birth control: Says she is not sexually active. Previously discussed teratogenic risk of pregnancy on sorafenib.  Hypokalemia  - on oral K per PCP.  Has f/u labs this week.      Dispo: RTC after MRI to reassess desmoid tumor          REASON FOR CONSULTATION:   Heidi Woodard is a 49 y.o. female who is seen in follow up for her desmoid fibromatosis.      HISTORY OF PRESENT ILLNESS:    About a year ago, pt developed shoulder pain associated with a fall. There was bruising and swelling but she attributed it to the injury. It never improved, and this summer she noted increased swelling and decreased ROM (which is limited by pain, rather than painless weakness). 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 She underwent CT of the left shoudler on 06/25/16 which showed a 16 cm mass involing the left deltoid with likely invasion of the left trapezius, triceps, and teres minor. Possible LN involvement of axillary and supraclav LN on left were present. On 07/07/2016 she saw Dr. Darral Dash and ultrasound-guided biopsy was performed with pathology revealing low-grade spindle cell lesion favoring fibromatosis. He felt that surgical resection would be highly morbid. On 9/20/017, she saw Dr Beckey Rutter, who did not favor RT due to inherent risks associated with RT.     She has ongoing pain, some tingling along the backs of her hands. Pain is intermittent if her arm is at rest, happening about every other day but when it happens it is very intense. Per above it limits her ADLs although she says she is managing.      Interval history:     Continues on sorafenib 200mg  nightly. Says she has not missed any doses for any reason.    Here to follow up on her MRI which got rescheduled for 02/08/20 bc machine was broken  Using urea cream nightly on feet  Shoulder feels good some days and other days in more pain. Sometimes waking up in middle of the night. This has been going on for gradually worse over time.     REVIEW OF SYSTEMS:  A comprehensive review of 14 systems was negative except for pertinent positives noted in HPI.    PMH: denies    PSH: denies    FHx: no fhx of FAP or desmoid tumors     Social History     Occupational History   ??? Not on file   Tobacco Use   ??? Smoking status: Never Smoker   ??? Smokeless tobacco: Never Used   Substance and Sexual Activity   ??? Alcohol use: No   ??? Drug use: No   ??? Sexual activity: Not on file       ALLERGIES/MEDICATIONS: Reviewed in EPIC    PHYSICAL EXAM:   General: in NAD, pleasant and conversant    Neuro: alert and oriented, no focal deficits  Psych: appropriate mood and affect    Rest deferred due to telehealth visit    +++++++++++++++++++++++++++++++++++++++++++++++++++++++++++     PATHOLOGY:   Pathology was personally reviewed as described in the HPI, detailed below.   07/07/2016  Surgical pathology exam  Final Diagnosis   A: Shoulder, left, biopsy   - Low grade spindle cell lesion, favor fibromatosis  - The lesional cells are positive for beta-catenin, weakly positive for smooth muscle actin and desmin, and negative for S100, supporting the interpretation of fibromatosis  - Dr. Pollie Friar concurs  Addendum:  Dr. Morrison Old dictating: The  last section of the impression should read:     Multiple mildly prominent left supraclavicular and axillary lymph nodes, metastatic disease cannot be excluded.    LABS:    Reviewed in epic    RADIOLOGY:  MRI 07/18/19 showed stable to slightly smaller mass.

## 2020-02-11 NOTE — Unmapped (Signed)
Chart reviewed   Provider on site

## 2020-02-15 MED ORDER — IBUPROFEN 800 MG TABLET
Freq: Three times a day (TID) | ORAL | 0 days | PRN
Start: 2020-02-15 — End: ?

## 2020-02-19 NOTE — Unmapped (Signed)
Clinical Pharmacist Practitioner: Telephone Note    Reason for Call - Chemotherapy Education    I called Ms. Mcadams today to provide education on doxil chemotherapy. We reviewed today the similarities and differences with her sorafenib, specifically the duration of therapy, route of administration, and adverse effects. Discussed doxil toxicities including, but not limited to, nausea, vomiting, diarrhea, alopecia, infusion-related reactions, fatigue, mucositis, myelosuppression, hand-foot syndrome, and cardiotoxicity.     Patient requested chemotherapy handout be mailed to her as she does not have a computer so has difficulty receiving information through MyChart or through email.    I spent 15 minutes in direct patient care.    Nicholos Johns, PharmD, CPP  Clinical Pharmacist Practitioner, Thoracic Oncology & Sarcoma  Pager: [phone]

## 2020-02-29 NOTE — Unmapped (Signed)
Hi,    Patient Heidi Woodard called requesting a medication refill for the following:    ??? Medication: oxycodone  ??? Dosage: 5mg   ??? Days left of medication: 6  ??? Pharmacy: [facility]      The expected turnaround time is 3-4 business days       Check Indicates criteria has been reviewed and confirmed with the patient:    [x]  Preferred Name   [x]  DOB and/or MR#  [x]  Preferred Contact Method  [x]  Phone Number(s)   [x]  Preferred Pharmacy   []  MyChart     Thank you,  [name]  [facility] Communication Center  [phone]

## 2020-03-03 NOTE — Unmapped (Signed)
Pt was calling about medication refill for the oxyCODONE (ROXICODONE) 5mg  wanted to know if this medication will be refilled by 02-08-20 or on the 9th. Pt hasn't heard anything and needs the medication. Contact phone number is [phone].     .Thank you,   Noland Fordyce  [facility] Communication Center  [phone]

## 2020-03-04 NOTE — Unmapped (Signed)
Called pt to let her know I will request refill a little closer to refill date. She was appreciative of the call back.

## 2020-03-08 NOTE — Unmapped (Signed)
Called pt to let her know I sent refill request to Dr. Loree Fee. Ask her to call me back on Monday if not filled by then. States she will.

## 2020-03-10 MED ORDER — OXYCODONE 5 MG TABLET
ORAL_TABLET | 0 refills | 0 days | Status: CP
Start: 2020-03-10 — End: ?

## 2020-03-10 NOTE — Unmapped (Signed)
Called pt to let her know refill was sent to pharmacy as requested.She was appreciative of the call back.

## 2020-03-10 NOTE — Unmapped (Signed)
Hi,    Patient Heidi Woodard called requesting a medication refill for the following:    ??? Medication:Oxycodone  ??? Dosage:5 mg  ??? Days left of medication: 0  ??? Pharmacy: [facility]      The expected turnaround time is 3-4 business days       Check Indicates criteria has been reviewed and confirmed with the patient:    [x]  Preferred Name   [x]  DOB and/or MR#  [x]  Preferred Contact Method  [x]  Phone Number(s)   [x]  Preferred Pharmacy   []  MyChart     Thank you,  [name]  [facility] Communication Center  [phone]

## 2020-03-18 NOTE — Unmapped (Signed)
Hi,     Lasandra Batley contacted the Communication Center requesting to speak with the care team of Heidi Woodard to discuss:    Wanted to follow up on disability assistance. Patient states that paperwork was supposed to have been sent to Dr. Loree Fee.    Please contact [name] at [phone].    Thank you,   Kelli Hope  [facility] Communication Center   [phone]

## 2020-03-18 NOTE — Unmapped (Signed)
Returned call to Heidi Woodard. She was checking to see if Dr. Loree Fee received any paperwork that needed to be completed by her for disability? Told her I would notify team and check status.

## 2020-04-01 ENCOUNTER — Other Ambulatory Visit (HOSPITAL_COMMUNITY): Payer: Self-pay | Admitting: *Deleted

## 2020-04-01 DIAGNOSIS — Z1231 Encounter for screening mammogram for malignant neoplasm of breast: Secondary | ICD-10-CM

## 2020-04-08 MED ORDER — OXYCODONE 5 MG TABLET
ORAL_TABLET | 0 refills | 0 days | Status: CP
Start: 2020-04-08 — End: ?

## 2020-04-08 NOTE — Unmapped (Signed)
Hi,    Patient Heidi Woodard called requesting a medication refill for the following:    ??? Medication: Roxicodone  ??? Dosage: 5 MG  ??? Days left of medication: 0  ??? Pharmacy: [facility]      The expected turnaround time is 3-4 business days       Check Indicates criteria has been reviewed and confirmed with the patient:    [x]  Preferred Name   [x]  DOB and/or MR#  [x]  Preferred Contact Method  [x]  Phone Number(s)   [x]  Preferred Pharmacy   []  MyChart     Thank you,  Drema Balzarine  [facility] Cancer Communication Center  [phone]

## 2020-04-08 NOTE — Unmapped (Signed)
AOC Triage Note     Patient: Heidi Woodard     Reason for call: follow up call    Time call returned: 1546     Phone Assessment: No answer, voice mail     Triage Recommendations: Left message that oxycodone was called in but cannot be filled until 6/10 since her current prescription should not be completed until than.  Call back if there is anything else we can help her with.     Patient Response: unknown     Outstanding tasks: patient to return call if needed.     Patient Pharmacy has been verified and primary pharmacy has been marked as preferred

## 2020-04-08 NOTE — Unmapped (Signed)
AOC Triage Note     Patient: Heidi Woodard     Reason for call:  return call    Time call returned: 1504      Phone Assessment:  Spoke with Nicole Cella, she was calling for refill of her oxycodone, she is completely out of medication.     Triage Recommendations: Will let team know to refill oxycodone.     Patient Response: greatful     Outstanding tasks: refill oxycodone     Patient Pharmacy has been verified and primary pharmacy has been marked as preferred

## 2020-04-10 ENCOUNTER — Ambulatory Visit (HOSPITAL_COMMUNITY)
Admission: RE | Admit: 2020-04-10 | Discharge: 2020-04-10 | Disposition: A | Discharge status: Home / Self Care | Payer: Medicaid Other | Source: Ambulatory Visit | Attending: *Deleted | Admitting: *Deleted

## 2020-04-10 ENCOUNTER — Other Ambulatory Visit: Payer: Self-pay

## 2020-04-10 DIAGNOSIS — Z1231 Encounter for screening mammogram for malignant neoplasm of breast: Secondary | ICD-10-CM

## 2020-04-17 ENCOUNTER — Ambulatory Visit (HOSPITAL_COMMUNITY)
Admission: RE | Admit: 2020-04-17 | Discharge: 2020-04-17 | Disposition: A | Discharge status: Home / Self Care | Payer: Medicaid Other | Source: Ambulatory Visit | Attending: *Deleted | Admitting: *Deleted

## 2020-04-17 ENCOUNTER — Other Ambulatory Visit: Payer: Self-pay

## 2020-04-17 DIAGNOSIS — Z1231 Encounter for screening mammogram for malignant neoplasm of breast: Secondary | ICD-10-CM | POA: Insufficient documentation

## 2020-05-08 MED ORDER — OXYCODONE 5 MG TABLET
ORAL_TABLET | 0 refills | 0 days | Status: CP
Start: 2020-05-08 — End: ?

## 2020-05-08 NOTE — Unmapped (Signed)
Hi,    Patient Heidi Woodard called requesting a medication refill for the following:    ??? Medication: oxycodone  ??? Dosage: 5mg   ??? Days left of medication: 0  ??? Pharmacy: [facility]    The expected turnaround time is 3-4 business days       Check Indicates criteria has been reviewed and confirmed with the patient:    [x]  Preferred Name   [x]  DOB and/or MR#  [x]  Preferred Contact Method  [x]  Phone Number(s)   [x]  Preferred Pharmacy   []  MyChart     Thank you,  [name]  [facility]  [phone]

## 2020-05-09 NOTE — Unmapped (Signed)
Hi,     Patient contacted the Communication Center regarding the following:    - Patient call would like to reschedule their in clinic appointment on 05/13/20.    Please contact patient at [phone].    Thanks in advance,    [name]  [facility] Communication Center   [phone]

## 2020-05-09 NOTE — Unmapped (Signed)
Left message for pt that refill script was sent to pharmacy. She may call back with any further concerns.

## 2020-05-12 NOTE — Unmapped (Addendum)
Hi,     Patient contacted the Communication Center regarding the following:    - Requesting new script for remaining 60 tablets of roxicodone. The pharmacy only gave me 40 pills. They said I need a new prescription to get the other 60 pills.    Please contact patient at [phone].    Thanks in advance,    Heidi Woodard  [facility] Communication Center   [phone]

## 2020-05-13 NOTE — Unmapped (Signed)
Hi,    Patient Heidi Woodard called requesting a medication refill for the following: Need new prescription    ??? Medication: Oxycodone  ??? Dosage: 5 mg  ??? Days left of medication: 4  ??? Pharmacy: [facility]      The expected turnaround time is 3-4 business days       Check Indicates criteria has been reviewed and confirmed with the patient:    [x]  Preferred Name   [x]  DOB and/or MR#  [x]  Preferred Contact Method  [x]  Phone Number(s)   [x]  Preferred Pharmacy   []  MyChart     Thank you,  [name]  [facility] Communication Center  [phone]

## 2020-05-13 NOTE — Unmapped (Signed)
Called to let pt know a PA is required for her to get her script as her insurance changed. The team here at [facility] is working on [place].Will let her know when it has been approved.

## 2020-05-15 NOTE — Unmapped (Signed)
Patient contacted the Oncology Communication Center to request that a prescription be written for the oxycodone 5mg . Please send in the prescription to [facility]  in her area. Contact patient back at [phone].     .Thank you,   Noland Fordyce  [facility] Communication Center  [phone]

## 2020-05-16 DIAGNOSIS — G8929 Other chronic pain: Principal | ICD-10-CM

## 2020-05-16 MED ORDER — OXYCODONE 5 MG TABLET
ORAL_TABLET | 0 refills | 0 days | Status: CP
Start: 2020-05-16 — End: ?

## 2020-05-16 NOTE — Unmapped (Signed)
Hi,     [facility] contacted the Communication Center regarding the following:    - Patient received a 5 day supply (40 tablets) of oxycodone on 7/10 because we didn't have 100 tablets. The doctor put a note that said refill on or after 05/22/20. I tried to call Ms. Lanum but she didn't answer. I think she may be out of medication.    Please contact [facility] at [phone].    Thanks in advance,    [name]  [facility] Communication Center   [phone]

## 2020-05-16 NOTE — Unmapped (Signed)
Left voicemail for patient to let her know that the PA for her pain medicine is being worked on. Confirmation with MAP specialist.

## 2020-05-16 NOTE — Unmapped (Signed)
Pt was only able to fill #40 tab oxycodone on 05/10/20    Therefore this is 40% of the full #100/month prescription. 40% of her month is 12 days.    It is unclear if she needs a PA or other insurance issues caused her to only make a partial fill but the remaineder of the script was voided by the [facility] per discussion with Nicholos Johns, PharmD.     Therefore a new script for oxycodone #100 has been sent for fill on or after 05/22/20.

## 2020-05-16 NOTE — Unmapped (Signed)
Clinical Pharmacist Practitioner: Telephone Note    Reason for Call - Medication Access    I called Ms. Rehberg's [facility] pharmacy clarifying the 5d quantity of oxycodone dispensed on 7/8. Pharmacy reported that given her change in insurance to [facility], there is a quantity limit on the very first fill. This is not related to the [place] Stop Act limits, nor a prior authorization, but simply due to first fill on new insurance. Given inability for oxycodone prescription to be partial filled, #40 dispensed on 7/8, with the remaining #60 tablets voided.     Reached out to provider for new opioid prescription to be sent to local pharmacy, and MAPS team to submit PA which is likely needed to override quantity limits.     I spent 10 minutes in coordinating patient care.    Nicholos Johns, PharmD, CPP  Clinical Pharmacist Practitioner, Thoracic Oncology & Sarcoma  Pager: [phone]

## 2020-05-16 NOTE — Unmapped (Signed)
Hi,     Patienr contacted the Communication Center regarding the following:    - Patient called following up on Oxycodone refill stated [facility] need today date on prescription in order to refill.    Please contact [facility] at [phone].    Thanks in advance,    [name]  [facility] Cancer Communication Center   [phone]

## 2020-05-16 NOTE — Unmapped (Signed)
Hi,    Patient Heidi Woodard called requesting a medication refill for the following:    ??? Medication: roxicodone  ??? Dosage: 5mg   ??? Days left of medication: 0  ??? Pharmacy: [facility]    Patient only received 40 tablets on 05/10/20. Pharmacy needs new script for remaining 60 tablets.    The expected turnaround time is 3-4 business days     Check Indicates criteria has been reviewed and confirmed with the patient:    [x]  Preferred Name   [x]  DOB and/or MR#  [x]  Preferred Contact Method  [x]  Phone Number(s)   [x]  Preferred Pharmacy   []  MyChart     Thank you,  [name]  [facility]  [phone]

## 2020-05-20 ENCOUNTER — Ambulatory Visit
Admit: 2020-05-20 | Discharge: 2020-05-21 | Payer: MEDICAID | Attending: Hematology & Oncology | Primary: Hematology & Oncology

## 2020-05-20 ENCOUNTER — Other Ambulatory Visit: Admit: 2020-05-20 | Discharge: 2020-05-21 | Payer: MEDICAID

## 2020-05-20 DIAGNOSIS — R21 Rash and other nonspecific skin eruption: Principal | ICD-10-CM

## 2020-05-20 DIAGNOSIS — Z79899 Other long term (current) drug therapy: Principal | ICD-10-CM

## 2020-05-20 DIAGNOSIS — G8929 Other chronic pain: Principal | ICD-10-CM

## 2020-05-20 DIAGNOSIS — D481 Neoplasm of uncertain behavior of connective and other soft tissue: Principal | ICD-10-CM

## 2020-05-20 DIAGNOSIS — L98491 Non-pressure chronic ulcer of skin of other sites limited to breakdown of skin: Principal | ICD-10-CM

## 2020-05-20 DIAGNOSIS — M25512 Pain in left shoulder: Secondary | ICD-10-CM

## 2020-05-20 LAB — COMPREHENSIVE METABOLIC PANEL
ALBUMIN: 3.7 g/dL (ref 3.4–5.0)
ALKALINE PHOSPHATASE: 82 U/L (ref 46–116)
ALT (SGPT): 22 U/L (ref 10–49)
ANION GAP: 7 mmol/L (ref 5–14)
AST (SGOT): 30 U/L (ref <=34)
BILIRUBIN TOTAL: 0.5 mg/dL (ref 0.3–1.2)
BLOOD UREA NITROGEN: 15 mg/dL (ref 9–23)
BUN / CREAT RATIO: 21
CALCIUM: 10.2 mg/dL (ref 8.7–10.4)
CHLORIDE: 98 mmol/L (ref 98–107)
CO2: 32 mmol/L — ABNORMAL HIGH (ref 20.0–31.0)
CREATININE: 0.72 mg/dL
EGFR CKD-EPI NON-AA FEMALE: >90 mL/min/{1.73_m2} (ref >=60)
GLUCOSE RANDOM: 96 mg/dL (ref 70–179)
PROTEIN TOTAL: 7.7 g/dL (ref 5.7–8.2)
SODIUM: 137 mmol/L (ref 135–145)

## 2020-05-20 LAB — CBC W/ AUTO DIFF
BASOPHILS ABSOLUTE COUNT: 0.1 10*9/L (ref 0.0–0.1)
BASOPHILS RELATIVE PERCENT: 0.5 %
EOSINOPHILS ABSOLUTE COUNT: 0.5 10*9/L — ABNORMAL HIGH (ref 0.0–0.4)
EOSINOPHILS RELATIVE PERCENT: 5.7 %
HEMATOCRIT: 39.9 % (ref 36.0–46.0)
HEMOGLOBIN: 13.1 g/dL (ref 12.0–16.0)
LARGE UNSTAINED CELLS: 1 % (ref 0–4)
LYMPHOCYTES ABSOLUTE COUNT: 2.8 10*9/L (ref 1.5–5.0)
LYMPHOCYTES RELATIVE PERCENT: 29.9 %
MEAN CORPUSCULAR HEMOGLOBIN: 29.1 pg (ref 26.0–34.0)
MEAN CORPUSCULAR VOLUME: 88.3 fL (ref 80.0–100.0)
MEAN PLATELET VOLUME: 7.7 fL (ref 7.0–10.0)
MONOCYTES ABSOLUTE COUNT: 0.4 10*9/L (ref 0.2–0.8)
MONOCYTES RELATIVE PERCENT: 4.5 %
NEUTROPHILS ABSOLUTE COUNT: 5.4 10*9/L (ref 2.0–7.5)
PLATELET COUNT: 316 10*9/L (ref 150–440)
RED BLOOD CELL COUNT: 4.52 10*12/L (ref 4.00–5.20)
RED CELL DISTRIBUTION WIDTH: 14.3 % (ref 12.0–15.0)
WBC ADJUSTED: 9.2 10*9/L (ref 4.5–11.0)

## 2020-05-20 LAB — ALBUMIN: Albumin:MCnc:Pt:Ser/Plas:Qn:: 3.7

## 2020-05-20 LAB — LARGE UNSTAINED CELLS: Lab: 1

## 2020-05-20 LAB — MAGNESIUM: Magnesium:MCnc:Pt:Ser/Plas:Qn:: 1.6

## 2020-05-20 NOTE — Unmapped (Signed)
; ??????????????????  Support for cancer patients and their caregivers during the COVID-19 pandemic [email or web address]  ; Keeping cancer patients safe during the COVID-19 crisis [email or web address]-[email or web address]  ; +++++++++++++++++++++++++++++++++++++++++++++++++++++++++++  ; COVID-19 Vaccines:  ; Schedule an appointment online or call [phone] (M-F 8am-5pm).  ; For more information on how to get a vaccine, visit Get Vaccinated.  ; [email or web address].gonzalez.[email or web address]   ; +++++++++++++++++++++++++++++++++++++++++++++++++++++++++++  ;   ;  WHAT YOU SHOULD KNOW ABOUT THE COVID-19 VACCINES   ; People with cancer are at an increased risk for sickness and death from COVID-19. We recommend patients with cancer, including those who are receiving cancer treatment, be vaccinated as soon as they are eligible.   ; Know your platelet count. Some patients with specific cancers or those receiving certain treatments may have a low platelet count (part of the blood that helps with clotting). If this is you, talk with your care team prior to getting vaccinated.   ; Bone marrow transplant patients should wait until at least 3 months after transplant. Patients who have undergone a stem cell transplant (SCT) or Car-T/Cellular Therapy (CT) will need to wait at least 3 months after their transplant to receive a vaccine.   ; The vaccines work.   ; Scientists found the vaccines could help prevent people from getting sick with COVID-19.   ; The vaccines are safe. The COVID-19 vaccines went through every stage of clinical trials. More than 70,000 people of all different races and ethnicities participated in the COVID-19 vaccine trials. Health professionals will continue to monitor the vaccines' safety and effectiveness against the virus.   ; Very few people experience serious side effects. Just like other vaccines, the COVID-19 vaccines can cause minor side effects. If you are worried about potential side effects, contact your doctor.   ; For cancer patients, we recommend one of the two dose vaccine. This will help your body build up a strong resistance to COVID-19. You will get your second dose three to four weeks after your first one.   ; Some people should talk to their doctor first. People who have serious allergies and/or are pregnant, breastfeeding or immunocompromised should talk with their doctor before receiving a COVID-19 vaccine.   ; If you have had COVID-19, you should still get the vaccines. It is possible to get COVID-19 more than once and the vaccines will help to prevent you from getting sick again.   ; Please continue to wear a mask and practice physical distancing. Even after you get a COVID-19 vaccine, you should continue to wear a mask, wash your hands regularly and stay 6 feet apart. Thank you for helping protect yourself and your community!   ; +++++++++++++++++++++++++++++++++++++++++++++++++++++++++++  ; Your nurse navigator is:  ; [name], RN, BSN, OCN   ; Head & Neck/Sarcoma  Oncology Nurse Navigator  ; [email or web address]  ;   ; For appointments & questions Monday through Friday 8 AM??? 5 PM   ; please call [phone] or Toll free [phone].  ;   ; On Nights, Weekends and Holidays  ; Call [phone] and ask for the adult hematologist-oncologist on call.  ;   ; [facility]  ; [address]  ; [place], [place] [address]  ; [email or web address]  ; Tel: [phone] / Toll Free [phone]  ; Fax: [phone]  ; +++++++++++++++++++++++++++++++++++++++++++++++++++++++++++  ;   ;   ;

## 2020-05-20 NOTE — Unmapped (Signed)
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 Continue prn 1% hydrocort cream to face(OTC), triamcinolone cream to body, sunscreen or avoidance, and to call if she has recurrent issues. PRN benadryl for itching with rash/flaking skin. Emolliants after bathing (cocoa butter ok).  Cornstarch or baby powder to skin folds to minimize chafing.   - Continue urea cream on callouses on feet and also along neck.   - MRI Left shoulder, 02/08/20, similar to some decreased bulk the large fibrotic mass posterior to the left shoulder   - Pt states she has 100% compliance with sorafenib, no issues with refills or copays.    - Labs drawn today reviewed and found adequate to continue therapy at 200mg  daily HOWEVER GIVEN UPCOMING ON 05/26/20 TKA SURGERY, WILL HAVE HER STOP IMMEDIATELY AND RTC 2 MONTHS. Will call if pain gets notably worse or tumor has marked growth  -reviewed possible options:   - doxil   - ongoing sorafenib (I dont think she would tolerate dose escalation)   - observation (sometimes these tumors burn out)   - likely that later this fall a gamma secretase inhibitor may be approved later this year    Hypokalemia, likely due to lisinopril/HCTZ  - replete potassium, particularly given upcoming surgery  - check magnesium    Essential HTN - likely exacerbated by sorafenib  - will monitor; advised BP may reduce off sorafenib, underscored importance of communicating this with her surgical team    Desmoid tumor associated pain -  - Reports taking 1 - 2 tabs when not working and 3-4 tabs when working oxycodone 5 mg per day with good pain control.  She reports pain is OK overall when not working but is exacerbated significantly by working.  Has not tried lidoderm patches.  - Oxy 5 #100 monthly. 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 Continue aggressive emollient use to prevent dry skin    Birth control: Says she is not sexually active. Awareof teratogenic risk of pregnancy on sorafenib.    Dispo: RTC 2 mo, due for q6 mo imaging, sooner if necessary    I personally spent a total of 40 minutes in care of the patient on the day of service, including 30 min in direct face to face discussion/counseling regarding disease/prognosis/treatment.        REASON FOR CONSULTATION:   Heidi Woodard is a 49 y.o. female who is seen in follow up for her desmoid fibromatosis.      HISTORY OF PRESENT ILLNESS:    About a year ago, pt developed shoulder pain associated with a fall. There was bruising and swelling but she attributed it to the injury. It never improved, and this summer she noted increased swelling and decreased ROM (which is limited by pain, rather than painless weakness). 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 It also affects her IADLs and ADLs such as dressing/bathing. She underwent CT of the left shoudler on 06/25/16 which showed a 16 cm mass involing the left deltoid with likely invasion of the left trapezius, triceps, and teres minor. Possible LN involvement of axillary and supraclav LN on left were present. On 07/07/2016 she saw Dr. Darral Dash and ultrasound-guided biopsy was performed with pathology revealing low-grade spindle cell lesion favoring fibromatosis. He felt that surgical resection would be highly morbid. On 9/20/017, she saw Dr Beckey Rutter, who did not favor RT due to inherent risks associated with RT.     She has ongoing pain, some tingling along the backs of her hands. Pain is intermittent if her arm is at rest, happening about every other day but when it happens it is very intense. Per above it limits her ADLs although she says she is managing.      Interval history:     Continues on sorafenib 200mg  nightly. Says she has not missed any doses for any reason.    Here for routine f/u  Appears as if her MRI got rescheduled for 02/08/20 bc machine was broken  Using urea cream nightly on feet which are much improved  Having TKA on 7/26 on LEFT knee  Had issues with oxy fill. PA was done, can pick up new script 7/22    REVIEW OF SYSTEMS:  A comprehensive review of 14 systems was negative except for pertinent positives noted in HPI.    PMH: denies    PSH: denies    FHx: no fhx of FAP or desmoid tumors     Social History     Occupational History   ??? Not on file   Tobacco Use   ??? Smoking status: Never Smoker   ??? Smokeless tobacco: Never Used   Substance and Sexual Activity   ??? Alcohol use: No   ??? Drug use: No   ??? Sexual activity: Not on file       ALLERGIES/MEDICATIONS:  Reviewed in EPIC    PHYSICAL EXAM:   General: in NAD, pleasant and conversant  VS: BP 142/98  - Pulse 79  - Temp 36.6 ??C (97.9 ??F) (Oral)  - Resp 16  - Wt (!) 104.3 kg (230 lb)  - SpO2 99%  - BMI 40.75 kg/m??   HEENT: EOMI, MMM  Neck: no thyromegaly, no cervical LAD  Cardio: RRR  Resp: CTA b/l, no increased WOB  Abd: soft, NTND, +BS, no HSM  Extr: warm, no lower extremity edema  MSK: palpable 4-5 cm mass on L shoulder with mild pain to the touch, full ROM (Though left with slightly more difficulty)  Skin: no rash/blisters/HFS  Neuro: alert and oriented, no focal deficits  Psych: appropriate mood and affect    +++++++++++++++++++++++++++++++++++++++++++++++++++++++++++     PATHOLOGY:   Pathology was personally reviewed as described in the HPI, detailed below.   07/07/2016  Surgical pathology exam  Final Diagnosis   A: Shoulder, left, biopsy - Low grade spindle cell lesion, favor fibromatosis  - The lesional cells are positive for beta-catenin, weakly positive for smooth muscle actin and desmin, and negative for S100, supporting the interpretation of fibromatosis  - Dr. Pollie Friar concurs  Addendum:  Dr. Morrison Old dictating: The last section of the impression should read:     Multiple mildly prominent left supraclavicular and axillary lymph nodes, metastatic disease cannot be excluded.    LABS:    Reviewed in epic    RADIOLOGY:  MRI 07/18/19 showed stable to slightly smaller mass.

## 2020-05-21 MED ORDER — POTASSIUM CHLORIDE ER 20 MEQ TABLET,EXTENDED RELEASE(PART/CRYST)
ORAL_TABLET | Freq: Every day | ORAL | 0 refills | 5 days | Status: CP
Start: 2020-05-21 — End: 2020-05-26

## 2020-05-21 NOTE — Unmapped (Signed)
Addended by: Nicholos Johns on: 05/21/2020 03:18 PM     Modules accepted: Orders

## 2020-05-27 MED ORDER — DOCUSATE SODIUM 100 MG CAPSULE
Freq: Every day | ORAL | 0 days
Start: 2020-05-27 — End: 2021-05-27

## 2020-05-27 MED ORDER — CELECOXIB 200 MG CAPSULE
Freq: Every day | ORAL | 0 days
Start: 2020-05-27 — End: ?

## 2020-06-13 ENCOUNTER — Encounter: Payer: Self-pay | Admitting: Physical Therapy

## 2020-06-13 ENCOUNTER — Ambulatory Visit: Payer: Medicaid Other | Attending: Orthopedic Surgery | Admitting: Physical Therapy

## 2020-06-13 ENCOUNTER — Other Ambulatory Visit: Payer: Self-pay

## 2020-06-13 DIAGNOSIS — R600 Localized edema: Secondary | ICD-10-CM | POA: Insufficient documentation

## 2020-06-13 DIAGNOSIS — R262 Difficulty in walking, not elsewhere classified: Secondary | ICD-10-CM | POA: Insufficient documentation

## 2020-06-13 DIAGNOSIS — M25662 Stiffness of left knee, not elsewhere classified: Secondary | ICD-10-CM | POA: Insufficient documentation

## 2020-06-13 DIAGNOSIS — M25562 Pain in left knee: Secondary | ICD-10-CM | POA: Diagnosis not present

## 2020-06-13 NOTE — Therapy (Signed)
[facility] [address] [place], [place], [address] Phone: [phone]   Fax:  [phone]  Physical Therapy Evaluation  Patient Details  Name: Connie Kidd MRN: [id number] Date of Birth: [birth year] Referring Provider (PT): Rada Hay, PA-C   Encounter Date: 06/13/2020   PT End of Session - 06/13/20 1039    Visit Number 1    Number of Visits 12    Date for PT Re-Evaluation 07/25/20    Authorization Type MCD sent on 06/13/2020 for visit approval    PT Start Time 0815    PT Stop Time 0900    PT Time Calculation (min) 45 min    Activity Tolerance Patient tolerated treatment well    Behavior During Therapy [facility] for tasks assessed/performed           Past Medical History:  Diagnosis Date  . Arthritis   . Hypertension     History reviewed. No pertinent surgical history.  There were no vitals filed for this visit.    Subjective Assessment - 06/13/20 0819    Subjective Pt arriving using a rollator walker s/p Left TKA on 05/26/2020. Pt reporting 4/10 pain.    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 5 minutes    How long can you walk comfortably? 10 minutes    Patient Stated Goals walk without walker," walk like I use to before surgery"    Currently in Pain? Yes    Pain Score 4     Pain Location Knee    Pain Orientation Left    Pain Descriptors / Indicators Aching    Pain Type Surgical pain    Pain Onset 1 to 4 weeks ago    Pain Frequency Intermittent    Aggravating Factors  bending, walking    Pain Relieving Factors massaging leg, pain meds, ice              [facility] PT Assessment - 06/13/20 0001      Assessment   Medical Diagnosis L TKA,     Referring Provider (PT) Rada Hay, PA-C    Onset Date/Surgical Date 05/26/20    Hand Dominance Right    Prior Therapy no      Precautions   Precautions None      Restrictions   Weight Bearing Restrictions No      Balance  Screen   Has the patient fallen in the past 6 months No    Is the patient reluctant to leave their home because of a fear of falling?  No      Home Environment   Living Environment Private residence    Living Arrangements Parent    Type of Home House    Home Access Ramped entrance    Entrance Stairs-Number of Steps 1      Prior Function   Level of Independence Independent    Vocation Part time employment    Hotel manager    Leisure watching movies, reading      Cognition   Overall Cognitive Status Within Functional Limits for tasks assessed      Observation/Other Assessments-Edema    Edema Circumferential      Circumferential Edema   Circumferential - Left  2 centimeter > than right      Posture/Postural Control   Posture/Postural Control Postural limitations    Postural Limitations Rounded Shoulders;Forward head;Increased lumbar lordosis      ROM / Strength   AROM /  PROM / Strength AROM;PROM;Strength      AROM   Overall AROM  Deficits    AROM Assessment Site Knee    Right/Left Knee Right;Left    Right Knee Extension 0    Right Knee Flexion 105    Left Knee Extension 0    Left Knee Flexion 82      PROM   PROM Assessment Site Knee    Right/Left Knee Left    Left Knee Extension 0    Left Knee Flexion 85      Strength   Strength Assessment Site Knee    Right/Left Knee Right;Left    Right Knee Flexion 4+/5    Right Knee Extension 4+/5    Left Knee Flexion 3/5    Left Knee Extension 2/5      Palpation   Palpation comment TTP, over incision site, steri-strips  intact      Transfers   Five time sit to stand comments  25.9 seconds using UE suppoort      Ambulation/Gait   Assistive device 4-wheeled walker    Gait Pattern Step-to pattern;Decreased step length - left;Decreased stance time - left;Antalgic                      Objective measurements completed on examination: See above findings.       [facility] Adult PT  Treatment/Exercise - 06/13/20 0001      Modalities   Modalities --                  PT Education - 06/13/20 0857    Education Details PT POC, HEP    Person(s) Educated Patient    Methods Explanation;Demonstration;Handout    Comprehension Verbalized understanding;Returned demonstration               PT Long Term Goals - 06/13/20 0858      PT LONG TERM GOAL #1   Title Pt will be independent in her HEP and progression.    Baseline initial HEP issued on 06/13/2020    Time 8    Period Weeks    Status New    Target Date 07/25/20      PT LONG TERM GOAL #2   Title Pt will improve her 5 time sit to stand to </= 14 seconds without UE support as needed with ,/= 2/10 pain reported.    Baseline 25.9 seconds with UE support    Period Weeks    Status New    Target Date 07/25/20      PT LONG TERM GOAL #3   Title Pt will improve her L knee flexion to >/= 115 degrees in order to improve gait and functional mobility.    Baseline 82 degrees Actively    Time 6    Period Weeks    Status New    Target Date 07/25/20      PT LONG TERM GOAL #4   Title Pt will be able to amb 1000 feet with no device with pain </= 2/10 on community surfaces with step through gait pattern.    Baseline amb short distances with rollator walker with antalgic gait, step to gait pattern.    Time 6    Period Weeks    Status New    Target Date 07/25/20                  Plan - 06/13/20 0936    Clinical Impression Statement Pt presenting for PT evaluation  s/p left TKA on 05/26/2020. Pt presenting with weakness in her L LE and inability to perform a straight leg raise. Pt with active ROM arc of 0-82 degrees of L knee. Pt currently amb with a rollator walker with antalgic gait with step to pattern and decreased knee flexion and foot clearance on the left. Skilled PT needed to progress pt toward her PLOF.    Personal Factors and Comorbidities Comorbidity 1    Comorbidities HTN, arthritis     Examination-Activity Limitations Squat;Stand;Sit;Transfers;Other    Examination-Participation Restrictions Community Activity;Other;Driving    Stability/Clinical Decision Making Stable/Uncomplicated    Clinical Decision Making Low    Rehab Potential Good    PT Frequency 2x / week    PT Duration 6 weeks    PT Treatment/Interventions ADLs/Self Care Home Management;Cryotherapy;Electrical Stimulation;Gait training;Stair training;Functional mobility training;Therapeutic activities;Therapeutic exercise;Passive range of motion;Manual techniques;Vasopneumatic Device;Taping    PT Next Visit Plan Nustep/ bike, TKA exericses, quad sets, SAQ, SLR, standing exericses, Vasopneumatic E-stim for pain and edema as needed.    PT Home Exercise Plan [email or web address]    Consulted and Agree with Plan of Care Patient           Patient will benefit from skilled therapeutic intervention in order to improve the following deficits and impairments:  Pain, Postural dysfunction, Decreased strength, Decreased activity tolerance, Increased edema, Decreased range of motion, Difficulty walking, Obesity  Visit Diagnosis: Acute pain of left knee - Plan: PT plan of care cert/re-cert  Stiffness of left knee, not elsewhere classified - Plan: PT plan of care cert/re-cert  Difficulty in walking, not elsewhere classified - Plan: PT plan of care cert/re-cert  Localized edema - Plan: PT plan of care cert/re-cert     Problem List There are no problems to display for this patient.   Sharmon Leyden, PT, MPT 06/13/2020, 10:49 AM  [facility] [address] [place], [place], [address] Phone: [phone]   Fax:  [phone]  Name: Connie Kidd MRN: [id number] Date of Birth: 1971/10/27

## 2020-06-13 NOTE — Patient Instructions (Signed)
Access Code: [email or web address] URL: [email or web address] Date: 06/13/2020 Prepared by: Narda Amber  Exercises Supine Heel Slide - 3 x daily - 7 x weekly - 2 sets - 10 reps Seated Heel Slide - 3 x daily - 7 x weekly - 2 sets - 10 reps Supine Active Straight Leg Raise - 3 x daily - 7 x weekly - 2 sets - 10 reps Long Sitting Quad Set with Towel Roll Under Heel - 3 x daily - 7 x weekly - 2 sets - 10 reps Sit to Stand with Counter Support - 3 x daily - 7 x weekly - 2 sets - 10 reps

## 2020-06-20 ENCOUNTER — Ambulatory Visit: Payer: Medicaid Other | Admitting: *Deleted

## 2020-06-20 ENCOUNTER — Other Ambulatory Visit: Payer: Self-pay

## 2020-06-20 DIAGNOSIS — M25562 Pain in left knee: Secondary | ICD-10-CM | POA: Diagnosis not present

## 2020-06-20 DIAGNOSIS — M25662 Stiffness of left knee, not elsewhere classified: Secondary | ICD-10-CM

## 2020-06-20 DIAGNOSIS — R262 Difficulty in walking, not elsewhere classified: Secondary | ICD-10-CM

## 2020-06-20 DIAGNOSIS — R600 Localized edema: Secondary | ICD-10-CM

## 2020-06-20 NOTE — Therapy (Signed)
[facility] [address] [place], [place], [address] Phone: [phone]   Fax:  [phone]  Physical Therapy Treatment  Patient Details  Name: Connie Kidd MRN: [id number] Date of Birth: Dec 09, 1970 Referring Provider (PT): Rada Hay, PA-C   Encounter Date: 06/20/2020   PT End of Session - 06/20/20 0910    Visit Number 2    Number of Visits 12    Date for PT Re-Evaluation 07/25/20    Authorization Type MCD sent on 06/13/2020 for visit approval    PT Start Time 0815    PT Stop Time 0906    PT Time Calculation (min) 51 min           Past Medical History:  Diagnosis Date  . Arthritis   . Hypertension     No past surgical history on file.  There were no vitals filed for this visit.   Subjective Assessment - 06/20/20 0829    Subjective Pt arriving using a rollator walker s/p Left TKA on 05/26/2020. Pt reporting 5/10 pain.    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 5 minutes    How long can you walk comfortably? 10 minutes    Patient Stated Goals walk without walker," walk like I use to before surgery"    Currently in Pain? Yes    Pain Score 5     Pain Location Knee    Pain Orientation Left    Pain Descriptors / Indicators Aching    Pain Type Surgical pain    Pain Onset 1 to 4 weeks ago                             [facility] Adult PT Treatment/Exercise - 06/20/20 0001      Exercises   Exercises Knee/Hip      Knee/Hip Exercises: Aerobic   Nustep L1 x 15 mins progressed seat x 3 for ROM progression      Knee/Hip Exercises: Standing   Forward Lunges Left;2 sets;10 reps    Rocker Board 3 minutes   PF/DF and calf stretch     Knee/Hip Exercises: Seated   Long Arc Quad Left;AROM;1 set;15 reps      Knee/Hip Exercises: Supine   Short Arc Quad Sets Strengthening;Left;2 sets;10 reps   5 sec holds     Modalities   Modalities Cryotherapy      Cryotherapy   Number  Minutes Cryotherapy 10 Minutes    Cryotherapy Location Knee    Type of Cryotherapy Ice pack      Manual Therapy   Manual Therapy Passive ROM    Passive ROM Passive stretching for flexion and extension with focus on flexion                       PT Long Term Goals - 06/13/20 0858      PT LONG TERM GOAL #1   Title Pt will be independent in her HEP and progression.    Baseline initial HEP issued on 06/13/2020    Time 8    Period Weeks    Status New    Target Date 07/25/20      PT LONG TERM GOAL #2   Title Pt will improve her 5 time sit to stand to </= 14 seconds without UE support as needed with ,/= 2/10 pain reported.    Baseline 25.9 seconds with UE  support    Period Weeks    Status New    Target Date 07/25/20      PT LONG TERM GOAL #3   Title Pt will improve her L knee flexion to >/= 115 degrees in order to improve gait and functional mobility.    Baseline 82 degrees Actively    Time 6    Period Weeks    Status New    Target Date 07/25/20      PT LONG TERM GOAL #4   Title Pt will be able to amb 1000 feet with no device with pain </= 2/10 on community surfaces with step through gait pattern.    Baseline amb short distances with rollator walker with antalgic gait, step to gait pattern.    Time 6    Period Weeks    Status New    Target Date 07/25/20                 Plan - 06/20/20 0912    Clinical Impression Statement Pt arrived today doing fairly well with LT knee. Rx focused on ROM and quad activation  with CKC as well as OKC exs. PROM performed for flexion and extension ROM with focus on flexion. Ice applied end of session with normal response. 90 degrees flexion today    Personal Factors and Comorbidities Comorbidity 1    Comorbidities HTN, arthritis    Examination-Participation Restrictions Community Activity;Other;Driving    Stability/Clinical Decision Making Stable/Uncomplicated    Rehab Potential Good    PT Frequency 2x / week    PT  Duration 6 weeks    PT Treatment/Interventions ADLs/Self Care Home Management;Cryotherapy;Electrical Stimulation;Gait training;Stair training;Functional mobility training;Therapeutic activities;Therapeutic exercise;Passive range of motion;Manual techniques;Vasopneumatic Device;Taping    PT Next Visit Plan Nustep/ bike, TKA exericses, quad sets, SAQ, SLR, standing exericses, Vasopneumatic E-stim for pain and edema as needed.           Patient will benefit from skilled therapeutic intervention in order to improve the following deficits and impairments:  Pain, Postural dysfunction, Decreased strength, Decreased activity tolerance, Increased edema, Decreased range of motion, Difficulty walking, Obesity  Visit Diagnosis: Acute pain of left knee  Stiffness of left knee, not elsewhere classified  Difficulty in walking, not elsewhere classified  Localized edema     Problem List There are no problems to display for this patient.   Jalacia Mattila,CHRIS, PTA 06/20/2020, 12:46 PM  [facility] [address] [place], [place], [address] Phone: [phone]   Fax:  [phone]  Name: Meghen Akopyan MRN: [id number] Date of Birth: 1971-01-30

## 2020-06-23 MED ORDER — OXYCODONE 5 MG TABLET
ORAL_TABLET | 0 refills | 0 days
Start: 2020-06-23 — End: ?

## 2020-06-23 NOTE — Unmapped (Signed)
Hi,    Patient Heidi Woodard called requesting a medication refill for the following:    ??? Medication: Oxycodone  ??? Dosage: 5mg   ??? Days left of medication: 0  ??? Pharmacy: [facility]        The expected turnaround time is 3-4 business days       Check Indicates criteria has been reviewed and confirmed with the patient:    [x]  Preferred Name   [x]  DOB and/or MR#  [x]  Preferred Contact Method  [x]  Phone Number(s)   [x]  Preferred Pharmacy   []  MyChart     Thank you,  Jacques Navy  [facility]  [phone]

## 2020-06-24 ENCOUNTER — Ambulatory Visit: Payer: Medicaid Other | Admitting: Physical Therapy

## 2020-06-25 NOTE — Unmapped (Signed)
Hi,     [name] contacted the Communication Center requesting to speak with the care team of Heidi Woodard to discuss:    Calling back to follow up on request for oxycodone refill.     Please contact [name] at [phone].    Thank you,   Kelli Hope  [facility]   [phone]

## 2020-06-26 ENCOUNTER — Other Ambulatory Visit: Payer: Self-pay

## 2020-06-26 ENCOUNTER — Ambulatory Visit: Payer: Medicaid Other | Admitting: Physical Therapy

## 2020-06-26 ENCOUNTER — Encounter: Payer: Self-pay | Admitting: Physical Therapy

## 2020-06-26 ENCOUNTER — Encounter: Payer: Medicaid Other | Admitting: *Deleted

## 2020-06-26 DIAGNOSIS — R262 Difficulty in walking, not elsewhere classified: Secondary | ICD-10-CM

## 2020-06-26 DIAGNOSIS — M25562 Pain in left knee: Secondary | ICD-10-CM

## 2020-06-26 DIAGNOSIS — R600 Localized edema: Secondary | ICD-10-CM

## 2020-06-26 DIAGNOSIS — M25662 Stiffness of left knee, not elsewhere classified: Secondary | ICD-10-CM

## 2020-06-26 NOTE — Unmapped (Signed)
Returned call to [name]. Let her know I have sent request to Dr.Grilley-Olson for refill.Will let her know when completed.

## 2020-06-26 NOTE — Unmapped (Signed)
Hi,     Jameela contacted the [employer] requesting to speak with the care team of Heidi Woodard to discuss:    Patient called to check on status of Oxycodone prescription refill.    Please contact Meera at [phone].        Check Indicates criteria has been reviewed and confirmed with the patient:    [x]  Preferred Name   [x]  DOB and/or MR#  [x]  Preferred Contact Method  [x]  Phone Number(s)   []  MyChart     Thank you,   Jacques Navy  [facility]   [phone]

## 2020-06-26 NOTE — Therapy (Signed)
[facility] [address] [place], [place], [address] Phone: [phone]   Fax:  [phone]  Physical Therapy Treatment  Patient Details  Name: Connie Kidd MRN: [id number] Date of Birth: September 09, 1971 Referring Provider (PT): Rada Hay, PA-C   Encounter Date: 06/26/2020   PT End of Session - 06/26/20 0950    Visit Number 3    Number of Visits 12    Date for PT Re-Evaluation 07/25/20    Authorization Type MCD sent on 06/13/2020 for visit approval    PT Start Time 0947    PT Stop Time 1029    PT Time Calculation (min) 42 min    Activity Tolerance Patient tolerated treatment well    Behavior During Therapy [facility] for tasks assessed/performed           Past Medical History:  Diagnosis Date   Arthritis    Hypertension     History reviewed. No pertinent surgical history.  There were no vitals filed for this visit.   Subjective Assessment - 06/26/20 0949    Subjective COVID 19 screening performed on patient upon arrival. Reports some soreness upon arrival.    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 5 minutes    How long can you walk comfortably? 10 minutes    Patient Stated Goals walk without walker," walk like I use to before surgery"    Currently in Pain? Yes    Pain Score 6     Pain Location Knee    Pain Orientation Left    Pain Descriptors / Indicators Sore    Pain Type Surgical pain    Pain Onset 1 to 4 weeks ago    Pain Frequency Intermittent              [facility] PT Assessment - 06/26/20 0001      Assessment   Medical Diagnosis L TKA,     Referring Provider (PT) Rada Hay, PA-C    Onset Date/Surgical Date 05/26/20    Hand Dominance Right    Next MD Visit 07/2020    Prior Therapy no      Precautions   Precautions None      Restrictions   Weight Bearing Restrictions No      ROM / Strength   AROM / PROM / Strength AROM      AROM   Overall AROM  Deficits     AROM Assessment Site Knee    Right/Left Knee Left    Left Knee Extension 0    Left Knee Flexion 108                         [facility] Adult PT Treatment/Exercise - 06/26/20 0001      Knee/Hip Exercises: Aerobic   Nustep L1, seat 9-7 x15 min      Knee/Hip Exercises: Standing   Heel Raises Both;15 reps    Heel Raises Limitations B toe raise x15 reps    Forward Lunges Left;10 reps;20 reps;2 seconds    Forward Lunges Limitations x10 on floor, x20 reps on 8" step    Lateral Step Up Left;15 reps;Hand Hold: 2;Step Height: 6"    Forward Step Up Left;15 reps;Hand Hold: 1;Step Height: 6"    Step Down Left;15 reps;Hand Hold: 1;Step Height: 4"   eccentric step down     Knee/Hip Exercises: Supine   Short Arc Quad Sets Strengthening;Left;20 reps    Terminal  Knee Extension Strengthening;Left;10 reps;Theraband    Theraband Level (Terminal Knee Extension) Level 2 (Red)    Bridges Strengthening;Both;15 reps    Straight Leg Raises AROM;Left;15 reps                       PT Long Term Goals - 06/26/20 1045      PT LONG TERM GOAL #1   Title Pt will be independent in her HEP and progression.    Baseline initial HEP issued on 06/13/2020    Time 8    Period Weeks    Status On-going      PT LONG TERM GOAL #2   Title Pt will improve her 5 time sit to stand to </= 14 seconds without UE support as needed with ,/= 2/10 pain reported.    Baseline 25.9 seconds with UE support    Period Weeks    Status On-going      PT LONG TERM GOAL #3   Title Pt will improve her L knee flexion to >/= 115 degrees in order to improve gait and functional mobility.    Baseline 82 degrees Actively    Time 6    Period Weeks    Status On-going      PT LONG TERM GOAL #4   Title Pt will be able to amb 1000 feet with no device with pain </= 2/10 on community surfaces with step through gait pattern.    Baseline amb short distances with rollator walker with antalgic gait, step to gait pattern.     Time 6    Period Weeks    Status On-going                 Plan - 06/26/20 1046    Clinical Impression Statement Patient presented in clinic with reports of mid level L knee soreness. Patient observed WNL for gait sequence with rollator. Patient progressed to more functional strength training such as forward steps and eccentric step downs. Increased quad activation exercises also completed with no complaints via patient. AROM of L knee measured as 0-108 deg.    Personal Factors and Comorbidities Comorbidity 1    Comorbidities HTN, arthritis    Examination-Activity Limitations Squat;Stand;Sit;Transfers;Other    Examination-Participation Restrictions Community Activity;Other;Driving    Stability/Clinical Decision Making Stable/Uncomplicated    Rehab Potential Good    PT Frequency 2x / week    PT Duration 6 weeks    PT Treatment/Interventions ADLs/Self Care Home Management;Cryotherapy;Electrical Stimulation;Gait training;Stair training;Functional mobility training;Therapeutic activities;Therapeutic exercise;Passive range of motion;Manual techniques;Vasopneumatic Device;Taping    PT Next Visit Plan Nustep/ bike, TKA exericses, quad sets, SAQ, SLR, standing exericses, Vasopneumatic E-stim for pain and edema as needed.    PT Home Exercise Plan [id number]    Consulted and Agree with Plan of Care Patient           Patient will benefit from skilled therapeutic intervention in order to improve the following deficits and impairments:  Pain, Postural dysfunction, Decreased strength, Decreased activity tolerance, Increased edema, Decreased range of motion, Difficulty walking, Obesity  Visit Diagnosis: Acute pain of left knee  Stiffness of left knee, not elsewhere classified  Difficulty in walking, not elsewhere classified  Localized edema     Problem List There are no problems to display for this patient.   Marvell Fuller, PTA 06/26/2020, 10:49 AM  [facility] [address] [place], [place], [address] Phone: [phone]   Fax:  [phone]  Name:  Connie Kidd MRN: [id number] Date of Birth: 23-Apr-1971

## 2020-06-27 ENCOUNTER — Ambulatory Visit: Payer: Medicaid Other | Admitting: Physical Therapy

## 2020-06-27 MED ORDER — OXYCODONE 5 MG TABLET
ORAL_TABLET | 0 refills | 0 days | Status: CP
Start: 2020-06-27 — End: ?

## 2020-06-27 NOTE — Unmapped (Signed)
Called to let pt know med refill was sent to pharmacy as requested. Ask her to call back with any further needs.

## 2020-06-30 MED ORDER — CYCLOBENZAPRINE 10 MG TABLET
Freq: Every day | ORAL | 0 days
Start: 2020-06-30 — End: ?

## 2020-07-01 ENCOUNTER — Encounter: Payer: Self-pay | Admitting: Physical Therapy

## 2020-07-01 ENCOUNTER — Other Ambulatory Visit: Payer: Self-pay

## 2020-07-01 ENCOUNTER — Ambulatory Visit: Payer: Medicaid Other | Admitting: Physical Therapy

## 2020-07-01 DIAGNOSIS — R600 Localized edema: Secondary | ICD-10-CM

## 2020-07-01 DIAGNOSIS — M25662 Stiffness of left knee, not elsewhere classified: Secondary | ICD-10-CM

## 2020-07-01 DIAGNOSIS — M25562 Pain in left knee: Secondary | ICD-10-CM

## 2020-07-01 DIAGNOSIS — R262 Difficulty in walking, not elsewhere classified: Secondary | ICD-10-CM

## 2020-07-01 NOTE — Therapy (Signed)
[facility] [address] [place], [place], [address] Phone: [phone]   Fax:  [phone]  Physical Therapy Treatment  Patient Details  Name: Connie Kidd MRN: [id number] Date of Birth: September 26, 1971 Referring Provider (PT): Rada Hay, PA-C   Encounter Date: 07/01/2020   PT End of Session - 07/01/20 1004    Visit Number 4    Number of Visits 12    Date for PT Re-Evaluation 07/25/20    Authorization Type MCD sent on 06/13/2020 for visit approval    PT Start Time 0955    PT Stop Time 1030   late arrival   PT Time Calculation (min) 35 min    Activity Tolerance Patient tolerated treatment well    Behavior During Therapy [facility] for tasks assessed/performed           Past Medical History:  Diagnosis Date  . Arthritis   . Hypertension     History reviewed. No pertinent surgical history.  There were no vitals filed for this visit.   Subjective Assessment - 07/01/20 1003    Subjective COVID 19 screening performed on patient upon arrival. Reports 6/10 L knee pain upon arrival.    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 5 minutes    How long can you walk comfortably? 10 minutes    Patient Stated Goals walk without walker," walk like I use to before surgery"    Currently in Pain? Yes    Pain Score 6     Pain Location Knee    Pain Orientation Left    Pain Descriptors / Indicators Discomfort    Pain Type Surgical pain    Pain Onset More than a month ago    Pain Frequency Intermittent              [facility] PT Assessment - 07/01/20 0001      Assessment   Medical Diagnosis L TKA,     Referring Provider (PT) Rada Hay, PA-C    Onset Date/Surgical Date 05/26/20    Hand Dominance Right    Next MD Visit 07/2020    Prior Therapy no      Precautions   Precautions None      Restrictions   Weight Bearing Restrictions No                         [facility] Adult PT  Treatment/Exercise - 07/01/20 0001      Knee/Hip Exercises: Aerobic   Nustep L4, seat 7-5 x15 min, monitored      Knee/Hip Exercises: Machines for Strengthening   Cybex Knee Extension 10# 2x10 reps    Cybex Knee Flexion 30# 2x10 reps      Knee/Hip Exercises: Standing   Forward Lunges Left;20 reps;3 seconds    Forward Lunges Limitations off 8" step    Terminal Knee Extension Strengthening;Left;15 reps;Theraband    Theraband Level (Terminal Knee Extension) Level 3 (Green)    Forward Step Up Left;15 reps;Hand Hold: 1;Step Height: 6"    Step Down Left;15 reps;Hand Hold: 1;Step Height: 4"    Step Down Limitations L heel dot 4" step x15 reps    Rocker Board 3 minutes                  PT Education - 07/01/20 1035    Education Details Gait sequencing/technique    Person(s) Educated Patient    Methods Explanation;Demonstration  Comprehension Verbalized understanding               PT Long Term Goals - 06/26/20 1045      PT LONG TERM GOAL #1   Title Pt will be independent in her HEP and progression.    Baseline initial HEP issued on 06/13/2020    Time 8    Period Weeks    Status On-going      PT LONG TERM GOAL #2   Title Pt will improve her 5 time sit to stand to </= 14 seconds without UE support as needed with ,/= 2/10 pain reported.    Baseline 25.9 seconds with UE support    Period Weeks    Status On-going      PT LONG TERM GOAL #3   Title Pt will improve her L knee flexion to >/= 115 degrees in order to improve gait and functional mobility.    Baseline 82 degrees Actively    Time 6    Period Weeks    Status On-going      PT LONG TERM GOAL #4   Title Pt will be able to amb 1000 feet with no device with pain </= 2/10 on community surfaces with step through gait pattern.    Baseline amb short distances with rollator walker with antalgic gait, step to gait pattern.    Time 6    Period Weeks    Status On-going                 Plan - 07/01/20 1216      Clinical Impression Statement Patient presented in clinic with reports of intermittant discomfort with donning shoes and socks due to L knee edema. Patient progressed to more functional activities and strengthening. No AD utilized for ambulation within clinic today. Patient was advised using demo and instruction regarding proper gait sequencing to allow for greater hip/knee ROM and proper heel strike.    Personal Factors and Comorbidities Comorbidity 1    Comorbidities HTN, arthritis    Examination-Activity Limitations Squat;Stand;Sit;Transfers;Other    Examination-Participation Restrictions Community Activity;Other;Driving    Stability/Clinical Decision Making Stable/Uncomplicated    Rehab Potential Good    PT Frequency 2x / week    PT Duration 6 weeks    PT Treatment/Interventions ADLs/Self Care Home Management;Cryotherapy;Electrical Stimulation;Gait training;Stair training;Functional mobility training;Therapeutic activities;Therapeutic exercise;Passive range of motion;Manual techniques;Vasopneumatic Device;Taping    PT Next Visit Plan Nustep/ bike, TKA exericses, quad sets, SAQ, SLR, standing exericses, Vasopneumatic E-stim for pain and edema as needed.    PT Home Exercise Plan [id number]    Consulted and Agree with Plan of Care Patient           Patient will benefit from skilled therapeutic intervention in order to improve the following deficits and impairments:  Pain, Postural dysfunction, Decreased strength, Decreased activity tolerance, Increased edema, Decreased range of motion, Difficulty walking, Obesity  Visit Diagnosis: Acute pain of left knee  Stiffness of left knee, not elsewhere classified  Difficulty in walking, not elsewhere classified  Localized edema     Problem List There are no problems to display for this patient.   Marvell Fuller, PTA 07/01/2020, 12:19 PM  [facility] [address] [place], [place],  [address] Phone: [phone]   Fax:  [phone]  Name: Connie Kidd MRN: [id number] Date of Birth: Aug 29, 1971

## 2020-07-04 ENCOUNTER — Other Ambulatory Visit: Payer: Self-pay

## 2020-07-04 ENCOUNTER — Ambulatory Visit: Payer: Medicaid Other | Attending: Orthopedic Surgery | Admitting: Physical Therapy

## 2020-07-04 ENCOUNTER — Encounter: Payer: Self-pay | Admitting: Physical Therapy

## 2020-07-04 DIAGNOSIS — M25562 Pain in left knee: Secondary | ICD-10-CM | POA: Insufficient documentation

## 2020-07-04 DIAGNOSIS — R600 Localized edema: Secondary | ICD-10-CM | POA: Insufficient documentation

## 2020-07-04 DIAGNOSIS — M25662 Stiffness of left knee, not elsewhere classified: Secondary | ICD-10-CM | POA: Diagnosis present

## 2020-07-04 DIAGNOSIS — R262 Difficulty in walking, not elsewhere classified: Secondary | ICD-10-CM | POA: Insufficient documentation

## 2020-07-04 NOTE — Therapy (Signed)
[facility] [address] [place], [place], [address] Phone: [phone]   Fax:  [phone]  Physical Therapy Treatment  Patient Details  Name: Connie Kidd MRN: [id number] Date of Birth: November 09, 1970 Referring Provider (PT): Rada Hay, PA-C   Encounter Date: 07/04/2020   PT End of Session - 07/04/20 0907    Visit Number 5    Number of Visits 12    Date for PT Re-Evaluation 07/25/20    Authorization Type MCD sent on 06/13/2020 for visit approval    PT Start Time 0901    PT Stop Time 0944    PT Time Calculation (min) 43 min    Activity Tolerance Patient tolerated treatment well    Behavior During Therapy [facility] for tasks assessed/performed           Past Medical History:  Diagnosis Date  . Arthritis   . Hypertension     History reviewed. No pertinent surgical history.  There were no vitals filed for this visit.   Subjective Assessment - 07/04/20 0907    Subjective COVID 19 screening performed on patient upon arrival. Reports a little stiffness.    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 5 minutes    How long can you walk comfortably? 10 minutes    Patient Stated Goals walk without walker," walk like I use to before surgery"    Currently in Pain? Other (Comment)   No pain assessment provided             [facility] PT Assessment - 07/04/20 0001      Assessment   Medical Diagnosis L TKA,     Referring Provider (PT) Rada Hay, PA-C    Onset Date/Surgical Date 05/26/20    Hand Dominance Right    Next MD Visit 07/2020    Prior Therapy no      Precautions   Precautions None      Restrictions   Weight Bearing Restrictions No                         [facility] Adult PT Treatment/Exercise - 07/04/20 0001      Knee/Hip Exercises: Aerobic   Recumbent Bike seat 6 x5 min (backwards, full revolution)    Nustep L4, seat 8 x13 min      Knee/Hip Exercises: Machines for  Strengthening   Cybex Knee Extension 10# 2x10 reps    Cybex Knee Flexion 30# 2x10 reps      Knee/Hip Exercises: Standing   Heel Raises Both;20 reps    Heel Raises Limitations B toe raise x20 reps    Forward Lunges Left;20 reps;3 seconds    Forward Lunges Limitations off 8" step    Hip Abduction AROM;Both;20 reps;Knee straight    Forward Step Up Left;20 reps;Hand Hold: 2;Step Height: 6"    Step Down Left;15 reps;Hand Hold: 1;Step Height: 4"    Functional Squat 15 reps    Rocker Board 3 minutes                       PT Long Term Goals - 06/26/20 1045      PT LONG TERM GOAL #1   Title Pt will be independent in her HEP and progression.    Baseline initial HEP issued on 06/13/2020    Time 8    Period Weeks    Status On-going      PT  LONG TERM GOAL #2   Title Pt will improve her 5 time sit to stand to </= 14 seconds without UE support as needed with ,/= 2/10 pain reported.    Baseline 25.9 seconds with UE support    Period Weeks    Status On-going      PT LONG TERM GOAL #3   Title Pt will improve her L knee flexion to >/= 115 degrees in order to improve gait and functional mobility.    Baseline 82 degrees Actively    Time 6    Period Weeks    Status On-going      PT LONG TERM GOAL #4   Title Pt will be able to amb 1000 feet with no device with pain </= 2/10 on community surfaces with step through gait pattern.    Baseline amb short distances with rollator walker with antalgic gait, step to gait pattern.    Time 6    Period Weeks    Status On-going                 Plan - 07/04/20 0949    Clinical Impression Statement Patient presented in clinic with reports of tightness of L knee. Patient progressed to stationary bike in which she could go backwards with full revolutions but unable to go forwards. Patient progressed to more strengthening with resistance and LLE SLS for proprioception. Intermittant VCs and demo required to promote proper technique of  functional squats.    Personal Factors and Comorbidities Comorbidity 1    Comorbidities HTN, arthritis    Examination-Activity Limitations Squat;Stand;Sit;Transfers;Other    Examination-Participation Restrictions Community Activity;Other;Driving    Stability/Clinical Decision Making Stable/Uncomplicated    Rehab Potential Good    PT Frequency 2x / week    PT Duration 6 weeks    PT Treatment/Interventions ADLs/Self Care Home Management;Cryotherapy;Electrical Stimulation;Gait training;Stair training;Functional mobility training;Therapeutic activities;Therapeutic exercise;Passive range of motion;Manual techniques;Vasopneumatic Device;Taping    PT Next Visit Plan Nustep/ bike, TKA exericses, quad sets, SAQ, SLR, standing exericses, Vasopneumatic E-stim for pain and edema as needed.    PT Home Exercise Plan [id number]    Consulted and Agree with Plan of Care Patient           Patient will benefit from skilled therapeutic intervention in order to improve the following deficits and impairments:  Pain, Postural dysfunction, Decreased strength, Decreased activity tolerance, Increased edema, Decreased range of motion, Difficulty walking, Obesity  Visit Diagnosis: Acute pain of left knee  Stiffness of left knee, not elsewhere classified  Difficulty in walking, not elsewhere classified  Localized edema     Problem List There are no problems to display for this patient.   Marvell Fuller, PTA 07/04/2020, 9:52 AM  [facility] [address] [place], [place], [address] Phone: [phone]   Fax:  [phone]  Name: Connie Kidd MRN: [id number] Date of Birth: 12-17-1970

## 2020-07-10 ENCOUNTER — Encounter: Payer: Medicaid Other | Admitting: Physical Therapy

## 2020-07-11 ENCOUNTER — Other Ambulatory Visit: Payer: Self-pay

## 2020-07-11 ENCOUNTER — Ambulatory Visit: Payer: Medicaid Other | Admitting: *Deleted

## 2020-07-11 DIAGNOSIS — M25662 Stiffness of left knee, not elsewhere classified: Secondary | ICD-10-CM

## 2020-07-11 DIAGNOSIS — R600 Localized edema: Secondary | ICD-10-CM

## 2020-07-11 DIAGNOSIS — M25562 Pain in left knee: Secondary | ICD-10-CM

## 2020-07-11 DIAGNOSIS — R262 Difficulty in walking, not elsewhere classified: Secondary | ICD-10-CM

## 2020-07-11 NOTE — Therapy (Signed)
[facility] [address] [place], [place], [address] Phone: [phone]   Fax:  [phone]  Physical Therapy Treatment  Patient Details  Name: Connie Kidd MRN: [id number] Date of Birth: 1971-06-22 Referring Provider (PT): Rada Hay, PA-C   Encounter Date: 07/11/2020   PT End of Session - 07/11/20 3016    Visit Number 6    Number of Visits 12    Date for PT Re-Evaluation 07/25/20    Authorization Type MCD sent on 06/13/2020 for visit approval    PT Start Time 0815    PT Stop Time 0901    PT Time Calculation (min) 46 min           Past Medical History:  Diagnosis Date  . Arthritis   . Hypertension     No past surgical history on file.  There were no vitals filed for this visit.   Subjective Assessment - 07/11/20 0824    Subjective COVID 19 screening performed on patient upon arrival. 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 5 minutes    How long can you walk comfortably? 10 minutes    Patient Stated Goals walk without walker," walk like I use to before surgery"    Pain Orientation Left    Pain Descriptors / Indicators Discomfort    Pain Type Surgical pain    Pain Onset More than a month ago                             [facility] Adult PT Treatment/Exercise - 07/11/20 0001      Knee/Hip Exercises: Aerobic   Recumbent Bike seat progression for ROM x13 min (, full revolution forward)      Knee/Hip Exercises: Machines for Strengthening   Cybex Knee Extension 10# 5x10 reps   Flexion stretch between each set   Cybex Knee Flexion 30# 5x10 reps      Knee/Hip Exercises: Standing   Forward Lunges Left;20 reps;3 seconds    Forward Lunges Limitations off 14" step    Step Down Left;Hand Hold: 1;Step Height: 4";20 reps    Rocker Board 3 minutes   PF/DF and calf stretching     Manual Therapy   Manual Therapy Passive ROM    Manual therapy comments 112  degrees PROM    Passive ROM Passive stretching for flexion and extension with focus on flexion                       PT Long Term Goals - 06/26/20 1045      PT LONG TERM GOAL #1   Title Pt will be independent in her HEP and progression.    Baseline initial HEP issued on 06/13/2020    Time 8    Period Weeks    Status On-going      PT LONG TERM GOAL #2   Title Pt will improve her 5 time sit to stand to </= 14 seconds without UE support as needed with ,/= 2/10 pain reported.    Baseline 25.9 seconds with UE support    Period Weeks    Status On-going      PT LONG TERM GOAL #3   Title Pt will improve her L knee flexion to >/= 115 degrees in order to improve gait and functional mobility.    Baseline 82 degrees Actively  Time 6    Period Weeks    Status On-going      PT LONG TERM GOAL #4   Title Pt will be able to amb 1000 feet with no device with pain </= 2/10 on community surfaces with step through gait pattern.    Baseline amb short distances with rollator walker with antalgic gait, step to gait pattern.    Time 6    Period Weeks    Status On-going                 Plan - 07/11/20 2951    Clinical Impression Statement Pt arrived doing better today with pain 4/10. RX focused on ROM and strengthening of LT knee/LE. PROM to 112 degrees today.    Personal Factors and Comorbidities Comorbidity 1    Comorbidities HTN, arthritis    Examination-Activity Limitations Squat;Stand;Sit;Transfers;Other    Examination-Participation Restrictions Community Activity;Other;Driving    Stability/Clinical Decision Making Stable/Uncomplicated    Rehab Potential Good    PT Frequency 2x / week    PT Duration 6 weeks    PT Treatment/Interventions ADLs/Self Care Home Management;Cryotherapy;Electrical Stimulation;Gait training;Stair training;Functional mobility training;Therapeutic activities;Therapeutic exercise;Passive range of motion;Manual techniques;Vasopneumatic Device;Taping     PT Next Visit Plan Nustep/ bike, TKA exericses, quad sets, SAQ, SLR, standing exericses,    Consulted and Agree with Plan of Care Patient           Patient will benefit from skilled therapeutic intervention in order to improve the following deficits and impairments:  Pain, Postural dysfunction, Decreased strength, Decreased activity tolerance, Increased edema, Decreased range of motion, Difficulty walking, Obesity  Visit Diagnosis: Acute pain of left knee  Stiffness of left knee, not elsewhere classified  Difficulty in walking, not elsewhere classified  Localized edema     Problem List There are no problems to display for this patient.   Carleena Mires,CHRIS, PTA 07/11/2020, 9:13 AM  [facility] [address] [place], [place], [address] Phone: [phone]   Fax:  [phone]  Name: Connie Kidd MRN: [id number] Date of Birth: [birth year]

## 2020-07-15 ENCOUNTER — Other Ambulatory Visit: Payer: Self-pay

## 2020-07-15 ENCOUNTER — Ambulatory Visit: Payer: Medicaid Other | Admitting: *Deleted

## 2020-07-15 DIAGNOSIS — M25662 Stiffness of left knee, not elsewhere classified: Secondary | ICD-10-CM

## 2020-07-15 DIAGNOSIS — M25562 Pain in left knee: Secondary | ICD-10-CM | POA: Diagnosis not present

## 2020-07-15 DIAGNOSIS — R600 Localized edema: Secondary | ICD-10-CM

## 2020-07-15 DIAGNOSIS — R262 Difficulty in walking, not elsewhere classified: Secondary | ICD-10-CM

## 2020-07-15 NOTE — Therapy (Signed)
[facility] [address] [place], [place], [address] Phone: [phone]   Fax:  [phone]  Physical Therapy Treatment  Patient Details  Name: Connie Kidd MRN: [id number] Date of Birth: 1970-12-31 Referring Provider (PT): Rada Hay, PA-C   Encounter Date: 07/15/2020   PT End of Session - 07/15/20 1305    Visit Number 7    Number of Visits 12    Date for PT Re-Evaluation 07/25/20    Authorization Type MCD sent on 06/13/2020 for visit approval    PT Start Time 1300    PT Stop Time 1349    PT Time Calculation (min) 49 min           Past Medical History:  Diagnosis Date  . Arthritis   . Hypertension     No past surgical history on file.  There were no vitals filed for this visit.   Subjective Assessment - 07/15/20 1303    Subjective COVID 19 screening performed on patient upon arrival. 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 5 minutes    How long can you walk comfortably? 10 minutes    Patient Stated Goals walk without walker," walk like I use to before surgery"    Currently in Pain? Yes    Pain Score 4     Pain Location Knee    Pain Orientation Left    Pain Descriptors / Indicators Discomfort    Pain Onset More than a month ago                             [facility] Adult PT Treatment/Exercise - 07/15/20 0001      Knee/Hip Exercises: Aerobic   Recumbent Bike seat progression for ROM x5 min (, full revolution forward)    Nustep L4, seat 8 x10 min      Knee/Hip Exercises: Machines for Strengthening   Cybex Knee Extension 10# 5x10 reps   Flexion stretch between each set   Cybex Knee Flexion 30# 5x10 reps      Knee/Hip Exercises: Standing   Forward Lunges Left;20 reps;3 seconds    Forward Lunges Limitations off 14" step    Forward Step Up Left;2 sets;10 reps;Hand Hold: 2;Step Height: 4"    Step Down Left;Step Height: 4";20 reps;Hand  Hold: 2    Rocker Board 3 minutes   PF/DF and calf stretching     Manual Therapy   Manual Therapy Passive ROM    Passive ROM Passive stretching for flexion and extension with focus on flexion                       PT Long Term Goals - 06/26/20 1045      PT LONG TERM GOAL #1   Title Pt will be independent in her HEP and progression.    Baseline initial HEP issued on 06/13/2020    Time 8    Period Weeks    Status On-going      PT LONG TERM GOAL #2   Title Pt will improve her 5 time sit to stand to </= 14 seconds without UE support as needed with ,/= 2/10 pain reported.    Baseline 25.9 seconds with UE support    Period Weeks    Status On-going      PT LONG TERM GOAL #3   Title Pt  will improve her L knee flexion to >/= 115 degrees in order to improve gait and functional mobility.    Baseline 82 degrees Actively    Time 6    Period Weeks    Status On-going      PT LONG TERM GOAL #4   Title Pt will be able to amb 1000 feet with no device with pain </= 2/10 on community surfaces with step through gait pattern.    Baseline amb short distances with rollator walker with antalgic gait, step to gait pattern.    Time 6    Period Weeks    Status On-going                 Plan - 07/15/20 1356    Clinical Impression Statement Pt arrived today doing fairly well with LT knee and feels she is doing better. She was guided through [facility] and [facility] exs for LT LE and did well.    Examination-Activity Limitations Squat;Stand;Sit;Transfers;Other    Examination-Participation Restrictions Community Activity;Other;Driving    Stability/Clinical Decision Making Stable/Uncomplicated    Rehab Potential Good    PT Frequency 2x / week    PT Duration 6 weeks    PT Treatment/Interventions ADLs/Self Care Home Management;Cryotherapy;Electrical Stimulation;Gait training;Stair training;Functional mobility training;Therapeutic activities;Therapeutic exercise;Passive range of motion;Manual  techniques;Vasopneumatic Device;Taping    PT Next Visit Plan Nustep/ bike, TKA exericses, quad sets, SAQ, SLR, standing exericses,    Consulted and Agree with Plan of Care Patient           Patient will benefit from skilled therapeutic intervention in order to improve the following deficits and impairments:  Pain, Postural dysfunction, Decreased strength, Decreased activity tolerance, Increased edema, Decreased range of motion, Difficulty walking, Obesity  Visit Diagnosis: Acute pain of left knee  Stiffness of left knee, not elsewhere classified  Difficulty in walking, not elsewhere classified  Localized edema     Problem List There are no problems to display for this patient.   Kodi Steil,CHRIS, PTA 07/15/2020, 2:03 PM  [facility] [address] [place], [place], [address] Phone: [phone]   Fax:  [phone]  Name: Connie Kidd MRN: [id number] Date of Birth: 10/29/1971

## 2020-07-18 ENCOUNTER — Ambulatory Visit: Payer: Medicaid Other | Admitting: *Deleted

## 2020-07-18 ENCOUNTER — Other Ambulatory Visit: Payer: Self-pay

## 2020-07-18 DIAGNOSIS — M25562 Pain in left knee: Secondary | ICD-10-CM | POA: Diagnosis not present

## 2020-07-18 DIAGNOSIS — R600 Localized edema: Secondary | ICD-10-CM

## 2020-07-18 DIAGNOSIS — R262 Difficulty in walking, not elsewhere classified: Secondary | ICD-10-CM

## 2020-07-18 DIAGNOSIS — M25662 Stiffness of left knee, not elsewhere classified: Secondary | ICD-10-CM

## 2020-07-18 NOTE — Unmapped (Signed)
Returned call to pt. States she is interested in starting sorafenib again. States she has started having more issue with left shoulder and was told to call if this occurred.Her next appt with Dr. Loree Fee is 08-04-20.    Let her know I will relay to team for their recommendation.

## 2020-07-18 NOTE — Unmapped (Signed)
Hi,     Jennylee contacted the [employer] regarding the following:    - States she was taken off her chemotherapy medication to undergo surgery. States she would like to start back on he chemotherapy again if that's ok.     Please contact Mande at [phone] .    Thanks in advance,    Drema Balzarine  [facility]   [phone]

## 2020-07-18 NOTE — Therapy (Signed)
[facility] [place], [place], [address] Phone: [phone]   Fax:  [phone]  Physical Therapy Treatment  Patient Details  Name: Connie Kidd MRN: [id number] Date of Birth: 1971-04-15 Referring Provider (PT): Marybelle Killings, PA-C   Encounter Date: 07/18/2020   PT End of Session - 07/18/20 0829    Visit Number 8    Number of Visits 12    Date for PT Re-Evaluation 07/25/20    Authorization Type MCD sent on 06/13/2020 for visit approval    PT Start Time 0820    PT Stop Time 0910    PT Time Calculation (min) 50 min    Activity Tolerance Patient tolerated treatment well           Past Medical History:  Diagnosis Date  . Arthritis   . Hypertension     No past surgical history on file.  There were no vitals filed for this visit.   Subjective Assessment - 07/18/20 0825    Subjective COVID 19 screening performed on patient upon arrival. 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 5 minutes    How long can you walk comfortably? 10 minutes    Patient Stated Goals walk without walker," walk like I use to before surgery"    Currently in Pain? Yes    Pain Score 4     Pain Location Knee    Pain Orientation Left    Pain Descriptors / Indicators Discomfort    Pain Type Surgical pain    Pain Onset More than a month ago                             [facility] Adult PT Treatment/Exercise - 07/18/20 0001      Knee/Hip Exercises: Aerobic   Recumbent Bike seat progression to 6 for ROM x6 min (, full revolution forward)    Nustep L4, seat 8 x10 min      Knee/Hip Exercises: Machines for Strengthening   Cybex Knee Extension 10# 5x10 reps    Cybex Knee Flexion 30# 5x10 reps      Knee/Hip Exercises: Standing   Forward Lunges Left;20 reps;3 seconds    Forward Lunges Limitations off 14" step    Forward Step Up Left;2 sets;10 reps;Hand Hold: 2;Step  Height: 4"    Step Down Left;Step Height: 4";20 reps;Hand Hold: 2    Rocker Board 3 minutes   PF/DF and calf stretching     Manual Therapy   Manual Therapy Passive ROM    Passive ROM Passive stretching for flexion and extension with focus on flexion                       PT Long Term Goals - 07/18/20 0849      PT LONG TERM GOAL #1   Title Pt will be independent in her HEP and progression.    Baseline initial HEP issued on 06/13/2020    Period Weeks    Status On-going      PT LONG TERM GOAL #2   Title Pt will improve her 5 time sit to stand to </= 14 seconds without UE support as needed with ,/= 2/10 pain reported.    Baseline 25.9 seconds with UE support, 07-18-20  20 secs    Period Weeks    Status Partially Met  PT LONG TERM GOAL #3   Title Pt will improve her L knee flexion to >/= 115 degrees in order to improve gait and functional mobility.    Baseline 07-18-20  110 degrees    Time 6    Period Weeks    Status On-going      PT LONG TERM GOAL #4   Title Pt will be able to amb 1000 feet with no device with pain </= 2/10 on community surfaces with step through gait pattern.    Baseline amb short distances with rollator walker with antalgic gait, step to gait pattern.    Period Weeks    Status Achieved                 Plan - 07/18/20 0845    Clinical Impression Statement Pt arrived today doing well from last Rx. Rx focused on ROM and strengthening for LT LE and Pt continues to progress. PROM LT knee today 110 degrees. Unable to meet sit to stand LTG and is ongoing    Personal Factors and Comorbidities Comorbidity 1    Comorbidities HTN, arthritis    Examination-Activity Limitations Squat;Stand;Sit;Transfers;Other    Examination-Participation Restrictions Community Activity;Other;Driving    Stability/Clinical Decision Making Stable/Uncomplicated    Rehab Potential Good    PT Frequency 2x / week    PT Duration 6 weeks    PT Treatment/Interventions  ADLs/Self Care Home Management;Cryotherapy;Electrical Stimulation;Gait training;Stair training;Functional mobility training;Therapeutic activities;Therapeutic exercise;Passive range of motion;Manual techniques;Vasopneumatic Device;Taping    PT Next Visit Plan Nustep/ bike, TKA exericses, quad sets, SAQ, SLR, standing exericses,    Consulted and Agree with Plan of Care Patient           Patient will benefit from skilled therapeutic intervention in order to improve the following deficits and impairments:  Pain, Postural dysfunction, Decreased strength, Decreased activity tolerance, Increased edema, Decreased range of motion, Difficulty walking, Obesity  Visit Diagnosis: Acute pain of left knee  Stiffness of left knee, not elsewhere classified  Difficulty in walking, not elsewhere classified  Localized edema     Problem List There are no problems to display for this patient.   Bettye Sitton,CHRIS, PTA 07/18/2020, 9:47 AM  [facility] [address] [place], [place], [address] Phone: [phone]   Fax:  [phone]  Name: Jordain Radin MRN: [id number] Date of Birth: 11-10-1970

## 2020-07-22 ENCOUNTER — Encounter: Payer: Self-pay | Admitting: Physical Therapy

## 2020-07-22 ENCOUNTER — Ambulatory Visit: Payer: Medicaid Other | Admitting: Physical Therapy

## 2020-07-22 ENCOUNTER — Other Ambulatory Visit: Payer: Self-pay

## 2020-07-22 DIAGNOSIS — M25562 Pain in left knee: Secondary | ICD-10-CM

## 2020-07-22 DIAGNOSIS — R262 Difficulty in walking, not elsewhere classified: Secondary | ICD-10-CM

## 2020-07-22 DIAGNOSIS — R600 Localized edema: Secondary | ICD-10-CM

## 2020-07-22 DIAGNOSIS — M25662 Stiffness of left knee, not elsewhere classified: Secondary | ICD-10-CM

## 2020-07-22 NOTE — Unmapped (Signed)
Called to let pt know Dr. Loree Fee will discuss with her at next appt on 08-04-20. Pt was appreciative of the call back.

## 2020-07-22 NOTE — Therapy (Signed)
[facility] [place], [place], [address] Phone: [phone]   Fax:  [phone]  Physical Therapy Treatment  Patient Details  Name: Connie Kidd MRN: [id number] Date of Birth: 1971/02/21 Referring Provider (PT): Marybelle Killings, PA-C   Encounter Date: 07/22/2020   PT End of Session - 07/22/20 0814    Visit Number 9    Number of Visits 12    Date for PT Re-Evaluation 07/25/20    Authorization Type MCD sent on 06/13/2020 for visit approval    PT Start Time 0739    PT Stop Time 0813    PT Time Calculation (min) 34 min    Activity Tolerance Patient tolerated treatment well    Behavior During Therapy [facility] for tasks assessed/performed           Past Medical History:  Diagnosis Date  . Arthritis   . Hypertension     History reviewed. No pertinent surgical history.  There were no vitals filed for this visit.   Subjective Assessment - 07/22/20 0753    Subjective COVID 19 screening performed on patient upon arrival. Reports stiffness in knee today.    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 5 minutes    How long can you walk comfortably? 10 minutes    Patient Stated Goals walk without walker," walk like I use to before surgery"    Currently in Pain? Yes    Pain Score 5     Pain Location Knee    Pain Orientation Left    Pain Descriptors / Indicators Discomfort   stiffness   Pain Type Surgical pain    Pain Onset More than a month ago    Pain Frequency Intermittent              [facility] PT Assessment - 07/22/20 0001      Assessment   Medical Diagnosis L TKA,     Referring Provider (PT) Marybelle Killings, PA-C    Onset Date/Surgical Date 05/26/20    Hand Dominance Right    Next MD Visit 10/2020    Prior Therapy no      Precautions   Precautions None      Restrictions   Weight Bearing Restrictions No                         [facility] Adult PT Treatment/Exercise  - 07/22/20 0001      Knee/Hip Exercises: Aerobic   Recumbent Bike L4, seat 7 x8 min    Nustep L4, seat 8 x10 min      Knee/Hip Exercises: Machines for Strengthening   Cybex Knee Extension 20# 3x10 reps    Cybex Knee Flexion 40# 3x10 reps      Knee/Hip Exercises: Standing   Forward Lunges Left;20 reps;3 seconds    Forward Lunges Limitations off 14" step    Step Down Left;15 reps;Hand Hold: 2;Step Height: 6"    Functional Squat 15 reps    Rocker Board 2 minutes    Other Standing Knee Exercises L heel dot 4" step x20 reps                       PT Long Term Goals - 07/18/20 0849      PT LONG TERM GOAL #1   Title Pt will be independent in her HEP and progression.    Baseline initial HEP issued on 06/13/2020  Period Weeks    Status On-going      PT LONG TERM GOAL #2   Title Pt will improve her 5 time sit to stand to </= 14 seconds without UE support as needed with ,/= 2/10 pain reported.    Baseline 25.9 seconds with UE support, 07-18-20  20 secs    Period Weeks    Status Partially Met      PT LONG TERM GOAL #3   Title Pt will improve her L knee flexion to >/= 115 degrees in order to improve gait and functional mobility.    Baseline 07-18-20  110 degrees    Time 6    Period Weeks    Status On-going      PT LONG TERM GOAL #4   Title Pt will be able to amb 1000 feet with no device with pain </= 2/10 on community surfaces with step through gait pattern.    Baseline amb short distances with rollator walker with antalgic gait, step to gait pattern.    Period Weeks    Status Achieved                 Plan - 07/22/20 0854    Clinical Impression Statement Patient presented in clinic with reports of increased stiffness of L knee. Patient able to progress with therex with education regarding pacing. Education regarding pacing also applied to her ADLs at home. Patient fatigues with therex session but no complaints of any increased L knee pain. None to minimal  antalgic deviations during gait.    Personal Factors and Comorbidities Comorbidity 1    Comorbidities HTN, arthritis    Examination-Activity Limitations Squat;Stand;Sit;Transfers;Other    Examination-Participation Restrictions Community Activity;Other;Driving    Stability/Clinical Decision Making Stable/Uncomplicated    Rehab Potential Good    PT Frequency 2x / week    PT Duration 6 weeks    PT Treatment/Interventions ADLs/Self Care Home Management;Cryotherapy;Electrical Stimulation;Gait training;Stair training;Functional mobility training;Therapeutic activities;Therapeutic exercise;Passive range of motion;Manual techniques;Vasopneumatic Device;Taping    PT Next Visit Plan Nustep/ bike, TKA exericses, quad sets, SAQ, SLR, standing exericses,    PT Home Exercise Plan [id number]    Consulted and Agree with Plan of Care Patient           Patient will benefit from skilled therapeutic intervention in order to improve the following deficits and impairments:  Pain, Postural dysfunction, Decreased strength, Decreased activity tolerance, Increased edema, Decreased range of motion, Difficulty walking, Obesity  Visit Diagnosis: Acute pain of left knee  Stiffness of left knee, not elsewhere classified  Difficulty in walking, not elsewhere classified  Localized edema     Problem List There are no problems to display for this patient.   Standley Brooking, PTA 07/22/2020, 9:14 AM  [facility] [address] [place], [place], [address] Phone: [phone]   Fax:  [phone]  Name: Connie Kidd MRN: [id number] Date of Birth: 30-Sep-1971

## 2020-07-25 ENCOUNTER — Ambulatory Visit: Payer: Medicaid Other | Admitting: *Deleted

## 2020-07-25 ENCOUNTER — Encounter: Payer: Self-pay | Admitting: *Deleted

## 2020-07-25 DIAGNOSIS — M25662 Stiffness of left knee, not elsewhere classified: Secondary | ICD-10-CM

## 2020-07-25 DIAGNOSIS — M25562 Pain in left knee: Secondary | ICD-10-CM | POA: Diagnosis not present

## 2020-07-25 DIAGNOSIS — R262 Difficulty in walking, not elsewhere classified: Secondary | ICD-10-CM

## 2020-07-25 DIAGNOSIS — R600 Localized edema: Secondary | ICD-10-CM

## 2020-07-25 MED ORDER — OXYCODONE 5 MG TABLET
ORAL_TABLET | ORAL | 0 refills | 0.00000 days | Status: CP
Start: 2020-07-25 — End: ?

## 2020-07-25 NOTE — Unmapped (Signed)
Called pt to let her know script requested was sent to pharmacy. She can pick it up on/after 07-28-20. She verbalizes understanding and will call back with any further needs.

## 2020-07-25 NOTE — Unmapped (Signed)
Hi,    Patient Heidi Woodard called requesting a medication refill for the following:    ??? Medication: roxicodone  ??? Dosage: 5mg   ??? Days left of medication: 0  ??? Pharmacy: [facility]    The expected turnaround time is 3-4 business days     Check Indicates criteria has been reviewed and confirmed with the patient:    [x]  Preferred Name   [x]  DOB and/or MR#  [x]  Preferred Contact Method  [x]  Phone Number(s)   [x]  Preferred Pharmacy   []  MyChart     Thank you,  [name]  [facility]  [phone]

## 2020-07-25 NOTE — Therapy (Signed)
[facility] [place], [place], [address] Phone: [phone]   Fax:  [phone]  Physical Therapy Treatment  Patient Details  Name: Clydell Alberts MRN: [id number] Date of Birth: November 23, 1970 Referring Provider (PT): Marybelle Killings, PA-C   Encounter Date: 07/25/2020   PT End of Session - 07/25/20 0827    Visit Number 10    Number of Visits 12    Date for PT Re-Evaluation 07/25/20    Authorization Type MCD sent on 06/13/2020 for visit approval    PT Start Time 0815    PT Stop Time 0900    PT Time Calculation (min) 45 min           Past Medical History:  Diagnosis Date  . Arthritis   . Hypertension     History reviewed. No pertinent surgical history.  There were no vitals filed for this visit.   Subjective Assessment - 07/25/20 0820    Subjective COVID 19 screening performed on patient upon arrival. Reports stiffness in knee today.    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 5 minutes    How long can you walk comfortably? 10 minutes    Patient Stated Goals walk without walker," walk like I use to before surgery"    Pain Score 4     Pain Location Knee    Pain Orientation Left    Pain Descriptors / Indicators Discomfort    Pain Type Surgical pain                             [facility] Adult PT Treatment/Exercise - 07/25/20 0001      Knee/Hip Exercises: Aerobic   Recumbent Bike L4, seat 6 x10 min      Knee/Hip Exercises: Machines for Strengthening   Cybex Knee Extension 10# 3x15 reps    Cybex Knee Flexion 20# 3x15 reps    Cybex Leg Press 1pl seat 5 x 25      Knee/Hip Exercises: Standing   Forward Lunges Left;20 reps;3 seconds    Forward Lunges Limitations off 14" step    Lateral Step Up Left;1 set;20 reps;Step Height: 6"    Step Down Left;15 reps;Hand Hold: 2;Step Height: 6"    Rocker Board 3 minutes   PF/DF and calf stretching     Manual Therapy    Manual Therapy Passive ROM    Passive ROM Manual stretching using contract/ relax  for flexion and extension with focus on flexion                       PT Long Term Goals - 07/18/20 0849      PT LONG TERM GOAL #1   Title Pt will be independent in her HEP and progression.    Baseline initial HEP issued on 06/13/2020    Period Weeks    Status On-going      PT LONG TERM GOAL #2   Title Pt will improve her 5 time sit to stand to </= 14 seconds without UE support as needed with ,/= 2/10 pain reported.    Baseline 25.9 seconds with UE support, 07-18-20  20 secs    Period Weeks    Status Partially Met      PT LONG TERM GOAL #3   Title Pt will improve her L knee flexion to >/= 115 degrees in order to improve gait and  functional mobility.    Baseline 07-18-20  110 degrees    Time 6    Period Weeks    Status On-going      PT LONG TERM GOAL #4   Title Pt will be able to amb 1000 feet with no device with pain </= 2/10 on community surfaces with step through gait pattern.    Baseline amb short distances with rollator walker with antalgic gait, step to gait pattern.    Period Weeks    Status Achieved                 Plan - 07/25/20 0901    Clinical Impression Statement Pt arrived today doing fairly well and reports knee is doing better, but would like to continue PT for ROM and strengthening. She did great today with added exs and knee PROM was 110 degrees in seated position. Recert next week for more visits.    Personal Factors and Comorbidities Comorbidity 1    Comorbidities HTN, arthritis    Examination-Activity Limitations Squat;Stand;Sit;Transfers;Other    Examination-Participation Restrictions Community Activity;Other;Driving    Rehab Potential Good    PT Frequency 2x / week    PT Duration 6 weeks    PT Treatment/Interventions ADLs/Self Care Home Management;Cryotherapy;Electrical Stimulation;Gait training;Stair training;Functional mobility training;Therapeutic  activities;Therapeutic exercise;Passive range of motion;Manual techniques;Vasopneumatic Device;Taping    PT Next Visit Plan Nustep/ bike, TKA exericses, quad sets, SAQ, SLR, standing exericses,    Consulted and Agree with Plan of Care Patient           Patient will benefit from skilled therapeutic intervention in order to improve the following deficits and impairments:  Pain, Postural dysfunction, Decreased strength, Decreased activity tolerance, Increased edema, Decreased range of motion, Difficulty walking, Obesity  Visit Diagnosis: Acute pain of left knee  Stiffness of left knee, not elsewhere classified  Difficulty in walking, not elsewhere classified  Localized edema     Problem List There are no problems to display for this patient.   Tabitha Tupper,CHRIS, PTA 07/25/2020, 9:08 AM  [facility] [address] [place], [place], [address] Phone: [phone]   Fax:  [phone]  Name: Navil Kole MRN: [id number] Date of Birth: 24-Jun-1971

## 2020-07-29 ENCOUNTER — Encounter: Payer: Self-pay | Admitting: *Deleted

## 2020-07-29 ENCOUNTER — Other Ambulatory Visit: Payer: Self-pay

## 2020-07-29 ENCOUNTER — Ambulatory Visit: Payer: Medicaid Other | Admitting: *Deleted

## 2020-07-29 DIAGNOSIS — M25662 Stiffness of left knee, not elsewhere classified: Secondary | ICD-10-CM

## 2020-07-29 DIAGNOSIS — M25562 Pain in left knee: Secondary | ICD-10-CM

## 2020-07-29 DIAGNOSIS — R262 Difficulty in walking, not elsewhere classified: Secondary | ICD-10-CM

## 2020-07-29 DIAGNOSIS — R600 Localized edema: Secondary | ICD-10-CM

## 2020-07-29 NOTE — Therapy (Signed)
[facility] [place], [place], [address] Phone: [phone]   Fax:  [phone]  Physical Therapy Treatment  Patient Details  Name: Connie Kidd MRN: [id number] Date of Birth: [birth year] Referring Provider (PT): Marybelle Killings, PA-C   Encounter Date: 07/29/2020   PT End of Session - 07/29/20 0951    Visit Number 11    Number of Visits 12    Date for PT Re-Evaluation 07/25/20    Authorization Type MCD sent on 06/13/2020 for visit approval    PT Start Time 0946    PT Stop Time 1035    PT Time Calculation (min) 49 min           Past Medical History:  Diagnosis Date  . Arthritis   . Hypertension     History reviewed. No pertinent surgical history.  There were no vitals filed for this visit.   Subjective Assessment - 07/29/20 0950    Subjective COVID 19 screening performed on patient upon arrival. Reports stiffness in knee today. 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 5 minutes    How long can you walk comfortably? 10 minutes    Patient Stated Goals walk without walker," walk like I use to before surgery"    Currently in Pain? Yes    Pain Score 3     Pain Location Knee    Pain Orientation Left    Pain Descriptors / Indicators Discomfort    Pain Type Surgical pain    Pain Onset More than a month ago                             [facility] Adult PT Treatment/Exercise - 07/29/20 0001      Knee/Hip Exercises: Aerobic   Recumbent Bike L4, seat 6 x45mn    Nustep L4, seat 8 x10 min      Knee/Hip Exercises: Machines for Strengthening   Cybex Knee Extension 10# 3x15 reps    Cybex Knee Flexion 20# 3x15 reps    Cybex Leg Press 1pl seat 5 x 25      Knee/Hip Exercises: Standing   Forward Lunges Left;20 reps;3 seconds    Forward Lunges Limitations off 14" step    Lateral Step Up Left;1 set;20 reps;Step Height: 6"    Step Down Left;Hand Hold: 2;Step  Height: 6";10 reps;2 sets    Rocker Board 3 minutes   PF/DF and calf stretching     Manual Therapy   Manual Therapy Passive ROM    Passive ROM Manual stretching using contract/ relax  for flexion and extension with focus on flexion                       PT Long Term Goals - 07/18/20 0849      PT LONG TERM GOAL #1   Title Pt will be independent in her HEP and progression.    Baseline initial HEP issued on 06/13/2020    Period Weeks    Status On-going      PT LONG TERM GOAL #2   Title Pt will improve her 5 time sit to stand to </= 14 seconds without UE support as needed with ,/= 2/10 pain reported.    Baseline 25.9 seconds with UE support, 07-18-20  20 secs    Period Weeks    Status Partially Met  PT LONG TERM GOAL #3   Title Pt will improve her L knee flexion to >/= 115 degrees in order to improve gait and functional mobility.    Baseline 07-18-20  110 degrees    Time 6    Period Weeks    Status On-going      PT LONG TERM GOAL #4   Title Pt will be able to amb 1000 feet with no device with pain </= 2/10 on community surfaces with step through gait pattern.    Baseline amb short distances with rollator walker with antalgic gait, step to gait pattern.    Period Weeks    Status Achieved                 Plan - 07/29/20 1041    Clinical Impression Statement Pt arrived today doing fairly well with LT knee. She was able to continue with strengthening exs and act.'s f/b PROM for extension and flexion with contract / relax.    Personal Factors and Comorbidities Comorbidity 1    Comorbidities HTN, arthritis    Examination-Activity Limitations Squat;Stand;Sit;Transfers;Other    Examination-Participation Restrictions Community Activity;Other;Driving    Stability/Clinical Decision Making Stable/Uncomplicated    Rehab Potential Good    PT Frequency 2x / week    PT Duration 6 weeks    PT Treatment/Interventions ADLs/Self Care Home Management;Cryotherapy;Electrical  Stimulation;Gait training;Stair training;Functional mobility training;Therapeutic activities;Therapeutic exercise;Passive range of motion;Manual techniques;Vasopneumatic Device;Taping    PT Next Visit Plan Nustep/ bike, TKA exericses, quad sets, SAQ, SLR, standing exericses,      On hold after next visit until more visits approved    PT Home Exercise Plan [email or web address]    Consulted and Agree with Plan of Care Patient           Patient will benefit from skilled therapeutic intervention in order to improve the following deficits and impairments:  Pain, Postural dysfunction, Decreased strength, Decreased activity tolerance, Increased edema, Decreased range of motion, Difficulty walking, Obesity  Visit Diagnosis: Acute pain of left knee  Stiffness of left knee, not elsewhere classified  Difficulty in walking, not elsewhere classified  Localized edema     Problem List There are no problems to display for this patient.   Roey Coopman,CHRIS, PTA 07/29/2020, 10:47 AM  [facility], [place], [address] Phone: [phone]   Fax:  [phone]  Name: Connie Kidd MRN: [id number] Date of Birth: Mar 11, 1971

## 2020-08-01 ENCOUNTER — Encounter: Payer: Self-pay | Admitting: *Deleted

## 2020-08-01 ENCOUNTER — Ambulatory Visit: Payer: Medicaid Other | Attending: Orthopedic Surgery | Admitting: *Deleted

## 2020-08-01 ENCOUNTER — Other Ambulatory Visit: Payer: Self-pay

## 2020-08-01 DIAGNOSIS — R600 Localized edema: Secondary | ICD-10-CM | POA: Diagnosis present

## 2020-08-01 DIAGNOSIS — R262 Difficulty in walking, not elsewhere classified: Secondary | ICD-10-CM | POA: Insufficient documentation

## 2020-08-01 DIAGNOSIS — M25562 Pain in left knee: Secondary | ICD-10-CM | POA: Diagnosis not present

## 2020-08-01 DIAGNOSIS — M25662 Stiffness of left knee, not elsewhere classified: Secondary | ICD-10-CM | POA: Insufficient documentation

## 2020-08-01 NOTE — Therapy (Signed)
[facility] [place], [place], [address] Phone: [phone]   Fax:  [phone]  Physical Therapy Treatment  Patient Details  Name: Connie Kidd MRN: [id number] Date of Birth: [birth year] Referring Provider (PT): Marybelle Killings, PA-C   Encounter Date: 08/01/2020   PT End of Session - 08/01/20 0813    Visit Number 12    Number of Visits 12    Date for PT Re-Evaluation 07/25/20    Authorization Type MCD sent on 06/13/2020 for visit approval    PT Start Time 0815    PT Stop Time 0904    PT Time Calculation (min) 49 min           Past Medical History:  Diagnosis Date  . Arthritis   . Hypertension     History reviewed. No pertinent surgical history.  There were no vitals filed for this visit.   Subjective Assessment - 08/01/20 0818    Subjective COVID 19 screening performed on patient upon arrival. Reports stiffness in knee today. 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 5 minutes    How long can you walk comfortably? 10 minutes    Patient Stated Goals walk without walker," walk like I use to before surgery"    Currently in Pain? Yes    Pain Score 2     Pain Location Knee    Pain Orientation Left    Pain Descriptors / Indicators Discomfort    Pain Type Surgical pain    Pain Onset More than a month ago                             [facility] Adult PT Treatment/Exercise - 08/01/20 0001      Knee/Hip Exercises: Aerobic   Recumbent Bike L4, seat 6 x59mn    Nustep L4, seat 8 x10 min      Knee/Hip Exercises: Machines for Strengthening   Cybex Knee Extension 20# 3x15 reps    Cybex Knee Flexion 20# 3x15 reps    Cybex Leg Press 1pl seat 5 x 25      Knee/Hip Exercises: Standing   Forward Lunges Left;10 reps   hold 10 secs   Forward Lunges Limitations off 14" step    Lateral Step Up Left;1 set;20 reps;Step Height: 6"    Step Down Left;Hand Hold: 2;Step  Height: 6";10 reps;2 sets    Rocker Board 3 minutes      Manual Therapy   Manual Therapy Passive ROM    Passive ROM Manual stretching using contract/ relax  for flexion and extension with focus on flexion                       PT Long Term Goals - 08/01/20 0835      PT LONG TERM GOAL #1   Title Pt will be independent in her HEP and progression.    Baseline initial HEP issued on 06/13/2020    Time 8    Status Partially Met      PT LONG TERM GOAL #2   Title Pt will improve her 5 time sit to stand to </= 14 seconds without UE support as needed with ,/= 2/10 pain reported.    Baseline 25.9 seconds with UE support, 07-18-20  20 secs,  08-01-20  17 secs    Period Weeks    Status Partially  Met      PT LONG TERM GOAL #3   Title Pt will improve her L knee flexion to >/= 115 degrees in order to improve gait and functional mobility.    Baseline 08-01-20  112 degrees    Time 6    Period Weeks    Status On-going      PT LONG TERM GOAL #4   Title Pt will be able to amb 1000 feet with no device with pain </= 2/10 on community surfaces with step through gait pattern.    Period Weeks    Status Achieved                 Plan - 08/01/20 9892    Clinical Impression Statement Pt arrived today doing fairly well with mainly tightness in LT knee. She was guided through [facility] as well as [facility] for ROM as well as strengthening for LT LE. She was able to progress in sit to stand time today and her PROM had improved to 112  degrees, but short of meeting her LTGs. Additional visits  requested and Pt will be on hold until then.    Personal Factors and Comorbidities Comorbidity 1    Comorbidities HTN, arthritis    Examination-Activity Limitations Squat;Stand;Sit;Transfers;Other    Examination-Participation Restrictions Community Activity;Other;Driving    Stability/Clinical Decision Making Stable/Uncomplicated    PT Frequency 2x / week    PT Duration 6 weeks    PT Treatment/Interventions  ADLs/Self Care Home Management;Cryotherapy;Electrical Stimulation;Gait training;Stair training;Functional mobility training;Therapeutic activities;Therapeutic exercise;Passive range of motion;Manual techniques;Vasopneumatic Device;Taping    PT Next Visit Plan On hold after until more visits approved    Consulted and Agree with Plan of Care Patient           Patient will benefit from skilled therapeutic intervention in order to improve the following deficits and impairments:  Pain, Postural dysfunction, Decreased strength, Decreased activity tolerance, Increased edema, Decreased range of motion, Difficulty walking, Obesity  Visit Diagnosis: Acute pain of left knee  Stiffness of left knee, not elsewhere classified  Difficulty in walking, not elsewhere classified  Localized edema     Problem List There are no problems to display for this patient.   Dona Klemann,CHRIS, PTA 08/01/2020, 12:43 PM  [facility] [address] [place], [place], [address] Phone: [phone]   Fax:  [phone]  Name: Connie Kidd MRN: [id number] Date of Birth: May 13, 1971

## 2020-08-04 ENCOUNTER — Other Ambulatory Visit: Admit: 2020-08-04 | Discharge: 2020-08-04 | Payer: BLUE CROSS/BLUE SHIELD

## 2020-08-04 ENCOUNTER — Ambulatory Visit: Admit: 2020-08-04 | Discharge: 2020-08-04 | Payer: BLUE CROSS/BLUE SHIELD

## 2020-08-04 DIAGNOSIS — D481 Neoplasm of uncertain behavior of connective and other soft tissue: Principal | ICD-10-CM

## 2020-08-04 DIAGNOSIS — L98491 Non-pressure chronic ulcer of skin of other sites limited to breakdown of skin: Principal | ICD-10-CM

## 2020-08-04 LAB — COMPREHENSIVE METABOLIC PANEL
ALBUMIN: 3.7 g/dL (ref 3.4–5.0)
ALKALINE PHOSPHATASE: 75 U/L (ref 46–116)
ALT (SGPT): 12 U/L (ref 10–49)
ANION GAP: 8 mmol/L (ref 5–14)
AST (SGOT): 23 U/L (ref <=34)
BILIRUBIN TOTAL: 0.4 mg/dL (ref 0.3–1.2)
BLOOD UREA NITROGEN: 14 mg/dL (ref 9–23)
BUN / CREAT RATIO: 16
CALCIUM: 10.1 mg/dL (ref 8.7–10.4)
CHLORIDE: 102 mmol/L (ref 98–107)
CREATININE: 0.87 mg/dL
EGFR CKD-EPI AA FEMALE: 89 mL/min/{1.73_m2} (ref >=60)
EGFR CKD-EPI NON-AA FEMALE: 77 mL/min/{1.73_m2} (ref >=60)
GLUCOSE RANDOM: 102 mg/dL (ref 70–179)
POTASSIUM: 3.1 mmol/L — ABNORMAL LOW (ref 3.4–4.5)
PROTEIN TOTAL: 8.1 g/dL (ref 5.7–8.2)
SODIUM: 140 mmol/L (ref 135–145)

## 2020-08-04 LAB — CBC W/ AUTO DIFF
BASOPHILS ABSOLUTE COUNT: 0 10*9/L (ref 0.0–0.1)
BASOPHILS RELATIVE PERCENT: 0.5 %
EOSINOPHILS ABSOLUTE COUNT: 0.3 10*9/L (ref 0.0–0.4)
EOSINOPHILS RELATIVE PERCENT: 3.7 %
HEMATOCRIT: 37.2 % (ref 36.0–46.0)
HEMOGLOBIN: 12.2 g/dL (ref 12.0–16.0)
LYMPHOCYTES ABSOLUTE COUNT: 3.3 10*9/L (ref 1.5–5.0)
LYMPHOCYTES RELATIVE PERCENT: 47.5 %
MEAN CORPUSCULAR HEMOGLOBIN CONC: 32.8 g/dL (ref 31.0–37.0)
MEAN CORPUSCULAR HEMOGLOBIN: 28.6 pg (ref 26.0–34.0)
MEAN CORPUSCULAR VOLUME: 87.1 fL (ref 80.0–100.0)
MEAN PLATELET VOLUME: 8.7 fL (ref 7.0–10.0)
MONOCYTES ABSOLUTE COUNT: 0.4 10*9/L (ref 0.2–0.8)
MONOCYTES RELATIVE PERCENT: 5.2 %
NEUTROPHILS ABSOLUTE COUNT: 2.9 10*9/L (ref 2.0–7.5)
NEUTROPHILS RELATIVE PERCENT: 41.4 %
RED BLOOD CELL COUNT: 4.27 10*12/L (ref 4.00–5.20)
RED CELL DISTRIBUTION WIDTH: 14.5 % (ref 12.0–15.0)
WBC ADJUSTED: 6.9 10*9/L (ref 4.5–11.0)

## 2020-08-04 LAB — HYPOCHROMIA

## 2020-08-04 LAB — BLOOD UREA NITROGEN: Urea nitrogen:MCnc:Pt:Ser/Plas:Qn:: 14

## 2020-08-04 MED ADMIN — gadobenate dimeglumine (MULTIHANCE) 529 mg/mL (0.1mmol/0.2mL) solution 10 mL: 10 mL | INTRAVENOUS | @ 16:00:00 | Stop: 2020-08-04

## 2020-08-04 NOTE — Unmapped (Signed)
Paged Dr Arvil Chaco for lab orders.      Labs drawn peripherally by jamin J.

## 2020-08-04 NOTE — Unmapped (Signed)
UPDATE:  --MRI results show stable desmoid tumor  --restart sorafenib at 200 mg nightly  --repeat labs on/around 08/19/20 at [facility]; will have pharmacist call to  review lab results/tolerance  --K still low; follow-up with PCP  --follow-up with [place] in 2 months + labs same day    ?? Support for cancer patients and their caregivers during the COVID-19 pandemic [email or web address]  ?? Keeping cancer patients safe during the COVID-19 crisis [email or web address]-covid-[email or web address]-crisis/    +++++++++++++++++++++++++++++++++++++++++++++++++++++++++++  COVID-19 Vaccines:  ?? Schedule an appointment online or call [phone] (M-F 8am-5pm).  ?? For more information on how to get a vaccine, visit Get Vaccinated.  ?? [email or web address]   +++++++++++++++++++++++++++++++++++++++++++++++++++++++++++      WHAT YOU SHOULD KNOW ABOUT THE COVID-19 VACCINES   People with cancer are at an increased risk for sickness and death from COVID-19. We recommend patients with cancer, including those who are receiving cancer treatment, be vaccinated as soon as they are eligible.   Know your platelet count. Some patients with specific cancers or those receiving certain treatments may have a low platelet count (part of the blood that helps with clotting). If this is you, talk with your care team prior to getting vaccinated.   Bone marrow transplant patients should wait until at least 3 months after transplant. Patients who have undergone a stem cell transplant (SCT) or Car-T/Cellular Therapy (CT) will need to wait at least 3 months after their transplant to receive a vaccine.   The vaccines work.   Scientists found the vaccines could help prevent people from getting sick with COVID-19.   The vaccines are safe. The COVID-19 vaccines went through every stage of clinical trials. More than 70,000 people of all different races and ethnicities participated in the COVID-19 vaccine trials. Health professionals will continue to monitor the vaccines' safety and effectiveness against the virus.   Very few people experience serious side effects. Just like other vaccines, the COVID-19 vaccines can cause minor side effects. If you are worried about potential side effects, contact your doctor.   You will need two doses of the vaccine. This will help your body build up a strong resistance to COVID-19. You will get your second dose three to four weeks after your first one.   Some people should talk to their doctor first. People who have serious allergies and/or are pregnant, breastfeeding or immunocompromised should talk with their doctor before receiving a COVID-19 vaccine.   If you have had COVID-19, you should still get the vaccines. It is possible to get COVID-19 more than once and the vaccines will help to prevent you from getting sick again.   Please continue to wear a mask and practice physical distancing. Even after you get a COVID-19 vaccine, you should continue to wear a mask, wash your hands regularly and stay 6 feet apart. Thank you for helping protect yourself and your community!   +++++++++++++++++++++++++++++++++++++++++++++++++++++++++++  Your nurse navigator is:  [name], RN, BSN, OCN   Head & Neck/Sarcoma  Oncology Nurse Navigator  [email or web address]     For appointments & questions Monday through Friday 8 AM??? 5 PM   please call [phone] or Toll free [phone].     On Nights, Weekends and Holidays  Call [phone] and ask for the adult hematologist-oncologist on call.     [facility]  [address]  [place], [place] [address]  [email or web address]  Tel: [phone] / Toll Free [phone]  Fax: [phone]

## 2020-08-04 NOTE — Unmapped (Signed)
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 Continue prn 1% hydrocort cream to face(OTC), triamcinolone cream to body, sunscreen or avoidance, and to call if she has recurrent issues. PRN benadryl for itching with rash/flaking skin. Emolliants after bathing (cocoa butter ok).  Cornstarch or baby powder to skin folds to minimize chafing.   - Continue urea cream on callouses on feet and also along neck.   - MRI Left shoulder, 02/08/20, similar to some decreased bulk the large fibrotic mass posterior to the left shoulder   - Pt states she has 100% compliance with sorafenib, no issues with refills or copays.    - 05/20/20: held sorafenib in anticipation of TKR on 05/26/20  - 08/04/20: MRI Left shoulder, similar size of fibrotic mass posterior left shoulder.  Pt reports increase in pain/swelling associated with tumor.  Will restart sorafenib 200 mg daily.  Labs in 2 - 3 weeks and then again in 2 months.    -Possible options:   - doxil   - ongoing sorafenib (I dont think she would tolerate dose escalation)   - observation (sometimes these tumors burn out)   - likely that later this fall a gamma secretase inhibitor may be approved later this year    Hypokalemia, likely due to lisinopril/HCTZ  - Pt to follow-up with PCP to address    Essential HTN - likely exacerbated by sorafenib  - will monitor; continues on hydrochlorothiazide/lisinopril    Desmoid tumor associated pain - increased off sorafenib  - Reports taking 1 - 2 tabs when not working and 3-4 tabs when working oxycodone 5 mg per day with good pain control.  She reports pain is OK overall when not working but is exacerbated significantly by working.    - Oxy 5 #100 monthly. See phone notes.  Last filled 07/28/20.    HFS, improved off sorafenib  - continue urea 10-20% cream bid as needed, likely be able to discontinue off sorafenib. Continue aggressive emollient use to prevent dry skin    Birth control: Says she is not sexually active. Awareof teratogenic risk of pregnancy on sorafenib.    Dispo:  Labs in 2 - 3 weeks.  F/U with CPP in 2 weeks and then in clinic in 2 months + labs.  C/W q6 mo imaging, sooner if necessary    I personally spent a total of 40 minutes in care of the patient on the day of service, including 30 min in direct face to face discussion/counseling regarding disease/prognosis/treatment.        REASON FOR CONSULTATION:   Heidi Woodard is a 49 y.o. female who is seen in follow up for her desmoid fibromatosis.      HISTORY OF PRESENT ILLNESS:    About a year ago, pt developed shoulder pain associated with a fall. There was bruising and swelling but she attributed it to the injury. It never improved, and this summer she  noted increased swelling and decreased ROM (which is limited by pain, rather than painless weakness). 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 She underwent CT of the left shoudler on 06/25/16 which showed a 16 cm mass involing the left deltoid with likely invasion of the left trapezius, triceps, and teres minor. Possible LN involvement of axillary and supraclav LN on left were present. On 07/07/2016 she saw Dr. Darral Dash and ultrasound-guided biopsy was performed with pathology revealing low-grade spindle cell lesion favoring fibromatosis. He felt that surgical resection would be highly morbid. On 9/20/017, she saw Dr Beckey Rutter, who did not favor RT due to inherent risks associated with RT.     She has ongoing pain, some tingling along the backs of her hands. Pain is intermittent if her arm is at rest, happening about every other day but when it happens it is very intense. Per above it limits her ADLs although she says she is managing.      Interval history:     Continues on sorafenib 200mg  nightly. Says she has not missed any doses for any reason.    Here for routine f/u  Held sorafenib x 2.5 months in anticipation/following TKR on 05/26/20.  Completed PT.  Not back at work yet.  Increased L shoulder pain over past month with swelling.  Using oxycodone 10 tabs per week  Calluses on feet improved  Hands/feet/ears darkening    REVIEW OF SYSTEMS:  A comprehensive review of 14 systems was negative except for pertinent positives noted in HPI.    PMH: denies    PSH: denies    FHx: no fhx of FAP or desmoid tumors     Social History     Occupational History   ??? Not on file   Tobacco Use   ??? Smoking status: Never Smoker   ??? Smokeless tobacco: Never Used   Substance and Sexual Activity   ??? Alcohol use: No   ??? Drug use: No   ??? Sexual activity: Not on file       ALLERGIES/MEDICATIONS:  Reviewed in EPIC    PHYSICAL EXAM:   General: in NAD, pleasant and conversant  VS: BP 131/90  - Pulse 100  - Temp 36.7 ??C (98.1 ??F) (Oral)  - Resp 17  - Ht 160 cm (5' 3)  - Wt 99.3 kg (219 lb)  - SpO2 95%  - BMI 38.79 kg/m??   HEENT: EOMI, MMM  Neck: no thyromegaly, no cervical LAD  Cardio: RRR  Resp: CTA b/l, no increased WOB  Abd: soft, NTND, +BS, no HSM  Extr: warm, no lower extremity edema  MSK: palpable 4-5 cm mass on L shoulder with mild pain to the touch, full ROM (Though left with slightly more difficulty)  Skin: no rash/blisters/HFS  Neuro: alert and oriented, no focal deficits  Psych: appropriate mood and affect    +++++++++++++++++++++++++++++++++++++++++++++++++++++++++++     PATHOLOGY:   Pathology was personally reviewed as described in the HPI, detailed below.   07/07/2016  Surgical pathology exam  Final Diagnosis   A: Shoulder, left, biopsy   - Low grade spindle cell lesion, favor fibromatosis  - The lesional cells are positive for beta-catenin, weakly positive for smooth muscle actin and desmin, and negative for S100, supporting the interpretation of fibromatosis  - Dr. Pollie Friar concurs  Addendum:  Dr. Morrison Old dictating: The last section of the impression should read:  Multiple mildly prominent left supraclavicular and axillary lymph nodes, metastatic disease cannot be excluded.    LABS:    Reviewed in epic    RADIOLOGY:  MRI 07/18/19 showed stable to slightly smaller mass.

## 2020-08-05 DIAGNOSIS — D481 Neoplasm of uncertain behavior of connective and other soft tissue: Principal | ICD-10-CM

## 2020-08-05 NOTE — Unmapped (Signed)
Addended by: Donzetta Kohut on: 08/05/2020 09:08 AM     Modules accepted: Orders

## 2020-08-07 ENCOUNTER — Ambulatory Visit: Payer: Medicaid Other | Admitting: Physical Therapy

## 2020-08-08 ENCOUNTER — Other Ambulatory Visit: Payer: Self-pay

## 2020-08-08 ENCOUNTER — Ambulatory Visit: Payer: Medicaid Other | Admitting: Physical Therapy

## 2020-08-08 ENCOUNTER — Encounter: Payer: Self-pay | Admitting: Physical Therapy

## 2020-08-08 DIAGNOSIS — R262 Difficulty in walking, not elsewhere classified: Secondary | ICD-10-CM

## 2020-08-08 DIAGNOSIS — M25662 Stiffness of left knee, not elsewhere classified: Secondary | ICD-10-CM

## 2020-08-08 DIAGNOSIS — M25562 Pain in left knee: Secondary | ICD-10-CM | POA: Diagnosis not present

## 2020-08-08 DIAGNOSIS — R600 Localized edema: Secondary | ICD-10-CM

## 2020-08-08 NOTE — Unmapped (Signed)
Called patient re: statin.  No issue with interaction bw sorafenib and statin.  Heidi Woodard remembers being told to stop statin but unclear as to why.  Pt will discuss further with PCP and restart if appropriate.  Encouraged pt to monitor BP as she restarts sorafenib and f/u with her PCP re: low K.  F/U over phone with pharmacist on 10/21 with labs at [facility] a few days before.  Pt verbalized understanding.

## 2020-08-08 NOTE — Therapy (Signed)
[facility] [place], [place], [address] Phone: [phone]   Fax:  [phone]  Physical Therapy Treatment  Patient Details  Name: Connie Kidd MRN: [id number] Date of Birth: [birth year] Referring Provider (PT): Marybelle Killings, PA-C   Encounter Date: 08/08/2020   PT End of Session - 08/08/20 0737    Visit Number 13    Number of Visits 13    Date for PT Re-Evaluation 08/08/20    Authorization Type MCD 07/29/2020-08/08/2020    PT Start Time 0733    PT Stop Time 0816    PT Time Calculation (min) 43 min    Activity Tolerance Patient tolerated treatment well    Behavior During Therapy [facility] for tasks assessed/performed           Past Medical History:  Diagnosis Date  . Arthritis   . Hypertension     History reviewed. No pertinent surgical history.  There were no vitals filed for this visit.   Subjective Assessment - 08/08/20 0736    Subjective COVID 19 screening performed on patient upon arrival. Reports feeling stiff today.    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 5 minutes    How long can you walk comfortably? 10 minutes    Patient Stated Goals walk without walker," walk like I use to before surgery"    Currently in Pain? Yes   did not provide number on pain scale             [facility] PT Assessment - 08/08/20 0001      Assessment   Medical Diagnosis L TKA,     Referring Provider (PT) Marybelle Killings, PA-C    Onset Date/Surgical Date 05/26/20    Hand Dominance Right    Next MD Visit 10/2020    Prior Therapy no      Precautions   Precautions None      Restrictions   Weight Bearing Restrictions No                         [facility] Adult PT Treatment/Exercise - 08/08/20 0001      Exercises   Exercises Knee/Hip      Knee/Hip Exercises: Aerobic   Recumbent Bike L4, seat 4 x95mn    Nustep L4, seat 6-4  x10 min      Knee/Hip Exercises: Machines for  Strengthening   Cybex Knee Extension 20# 3x15 reps    Cybex Knee Flexion 20# 3x15 reps      Knee/Hip Exercises: Standing   Forward Lunges Left;10 reps    Forward Lunges Limitations off 14" step    Step Down Left;Hand Hold: 2;Step Height: 6";10 reps;2 sets    Rocker Board 3 minutes      Manual Therapy   Manual Therapy Passive ROM    Passive ROM Manual stretching using contract/ relax  for flexion and extension with focus on flexion                       PT Long Term Goals - 08/08/20 0746      PT LONG TERM GOAL #1   Title Pt will be independent in her HEP and progression.    Baseline initial HEP issued on 06/13/2020    Time 8    Period Weeks    Status Achieved      PT LONG TERM GOAL #2   Title Pt  will improve her 5 time sit to stand to </= 14 seconds without UE support as needed with ,/= 2/10 pain reported.    Baseline 25.9 seconds with UE support, 07-18-20  20 secs,  08-01-20  17 secs    Period Weeks    Status Achieved      PT LONG TERM GOAL #3   Title Pt will improve her L knee flexion to >/= 115 degrees in order to improve gait and functional mobility.    Baseline 08-01-20  112 degrees    Time 6    Period Weeks    Status Not Met      PT LONG TERM GOAL #4   Title Pt will be able to amb 1000 feet with no device with pain </= 2/10 on community surfaces with step through gait pattern.    Baseline amb short distances with rollator walker with antalgic gait, step to gait pattern.    Time 6    Period Weeks    Status Achieved                 Plan - 08/08/20 6226    Clinical Impression Statement Patient responded well to therapy session with slight reports of increased pain particularly with standing TEs. Patient guided through TEs with good technique. Patient provided with HEP for quad strengthening and was instructed to continue to perform AROM TEs to maintain ROM. Patient reported understanding. Patient to be DC'ed tod ay.    Personal Factors and  Comorbidities Comorbidity 1    Comorbidities HTN, arthritis    Examination-Activity Limitations Squat;Stand;Sit;Transfers;Other    Examination-Participation Restrictions Community Activity;Other;Driving    Stability/Clinical Decision Making Stable/Uncomplicated    Clinical Decision Making Low    Rehab Potential Good    PT Frequency 2x / week    PT Duration 6 weeks    PT Treatment/Interventions ADLs/Self Care Home Management;Cryotherapy;Electrical Stimulation;Gait training;Stair training;Functional mobility training;Therapeutic activities;Therapeutic exercise;Passive range of motion;Manual techniques;Vasopneumatic Device;Taping    PT Next Visit Plan DC    PT Home Exercise Plan see patient education section    Consulted and Agree with Plan of Care Patient           Patient will benefit from skilled therapeutic intervention in order to improve the following deficits and impairments:  Pain, Postural dysfunction, Decreased strength, Decreased activity tolerance, Increased edema, Decreased range of motion, Difficulty walking, Obesity  Visit Diagnosis: Acute pain of left knee - Plan: PT plan of care cert/re-cert  Stiffness of left knee, not elsewhere classified - Plan: PT plan of care cert/re-cert  Difficulty in walking, not elsewhere classified - Plan: PT plan of care cert/re-cert  Localized edema - Plan: PT plan of care cert/re-cert     Problem List There are no problems to display for this patient.   Gabriela Eves, PT, DPT 08/08/2020, 11:41 AM  [facility] [address] [place], [place], [address] Phone: [phone]   Fax:  [phone]  Name: London Nonaka MRN: [id number] Date of Birth: 1971/05/04

## 2020-08-20 LAB — CBC W/ DIFFERENTIAL
BANDED NEUTROPHILS ABSOLUTE COUNT: 0 10*3/uL (ref 0.0–0.1)
BASOPHILS ABSOLUTE COUNT: 0 10*3/uL (ref 0.0–0.2)
BASOPHILS RELATIVE PERCENT: 0 %
EOSINOPHILS ABSOLUTE COUNT: 0.1 10*3/uL (ref 0.0–0.4)
EOSINOPHILS RELATIVE PERCENT: 1 %
HEMATOCRIT: 41.2 % (ref 34.0–46.6)
IMMATURE GRANULOCYTES: 0 %
LYMPHOCYTES ABSOLUTE COUNT: 3.8 10*3/uL — ABNORMAL HIGH (ref 0.7–3.1)
LYMPHOCYTES RELATIVE PERCENT: 42 %
MEAN CORPUSCULAR HEMOGLOBIN CONC: 32.5 g/dL (ref 31.5–35.7)
MEAN CORPUSCULAR HEMOGLOBIN: 27.3 pg (ref 26.6–33.0)
MEAN CORPUSCULAR VOLUME: 84 fL (ref 79–97)
MONOCYTES ABSOLUTE COUNT: 0.4 10*3/uL (ref 0.1–0.9)
MONOCYTES RELATIVE PERCENT: 4 %
NEUTROPHILS ABSOLUTE COUNT: 4.6 10*3/uL (ref 1.4–7.0)
PLATELET COUNT: 354 10*3/uL (ref 150–450)
RED BLOOD CELL COUNT: 4.9 x10E6/uL (ref 3.77–5.28)
RED CELL DISTRIBUTION WIDTH: 13.7 % (ref 11.7–15.4)
WHITE BLOOD CELL COUNT: 9 10*3/uL (ref 3.4–10.8)

## 2020-08-20 LAB — COMPREHENSIVE METABOLIC PANEL
ALBUMIN: 4.4 g/dL (ref 3.8–4.9)
ALKALINE PHOSPHATASE: 98 IU/L (ref 44–121)
ALT (SGPT): 17 IU/L (ref 0–32)
AST (SGOT): 28 IU/L (ref 0–40)
BILIRUBIN TOTAL: 0.6 mg/dL (ref 0.0–1.2)
BLOOD UREA NITROGEN: 10 mg/dL (ref 6–24)
BUN / CREAT RATIO: 12 (ref 9–23)
CALCIUM: 9.6 mg/dL (ref 8.7–10.2)
CHLORIDE: 99 mmol/L (ref 96–106)
CO2: 28 mmol/L (ref 20–29)
CREATININE: 0.86 mg/dL (ref 0.57–1.00)
GFR MDRD AF AMER: 90 mL/min/{1.73_m2}
GFR MDRD NON AF AMER: 78 mL/min/{1.73_m2}
GLUCOSE: 103 mg/dL — ABNORMAL HIGH (ref 65–99)
POTASSIUM: 3.5 mmol/L (ref 3.5–5.2)
SODIUM: 142 mmol/L (ref 134–144)

## 2020-08-20 LAB — HEMOGLOBIN: Hemoglobin:MCnc:Pt:Bld:Qn:: 13.4

## 2020-08-20 LAB — POTASSIUM: Potassium:SCnc:Pt:Ser/Plas:Qn:: 3.5

## 2020-08-21 ENCOUNTER — Institutional Professional Consult (permissible substitution): Admit: 2020-08-21 | Discharge: 2020-08-22 | Payer: BLUE CROSS/BLUE SHIELD

## 2020-08-21 DIAGNOSIS — D481 Neoplasm of uncertain behavior of connective and other soft tissue: Principal | ICD-10-CM

## 2020-08-21 MED ORDER — ONDANSETRON HCL 8 MG TABLET
ORAL_TABLET | Freq: Three times a day (TID) | ORAL | 1 refills | 10 days | Status: CP | PRN
Start: 2020-08-21 — End: ?

## 2020-08-21 NOTE — Unmapped (Signed)
Clinical Pharmacist Practitioner: [facility]    Patient Name: Heidi Woodard  Patient Age: 49 y.o.    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 No signs of HFSR currently. Labs from 10/19 appropriate and wnl.   -Continue sorafenib 200 mg daily    2. Nausea, Grade 1 - occasional nausea, no changes to PO intake.   -Start ondansetron 8 mg every 8 hours prn nausea/vomiting (Rx sent)    Follow-up:  Heidi Liv, PA on 10/06/20    ______________________________________________________________________    HPI:  Mr. Heidi Woodard is a 49 yo F with large desmoid fibromatosis who initially started treatment with sorafenib in 2018 but some difficulty tolerating therapy due to rash/HFSR. Sorafenib on hold for TKA surgery in July 2021 and recent MRI with similar size of fibrotic mass posterior left shoulder. Restarted sorafenib on 08/04/20.     Oral chemotherapy: Sorafenib 200 mg daily  Start date: July 2021  Access: MFA ([employer])    Interval History:  [name] reports she is feeling well since restarting sorafenib (08/04/20). She is taking it once a day in the evening on an empty stomach with no missed doses. Only notable thing is some nausea in the morning, but really only if she smells something weird. Usually she just eats crackers or drinks soda if feeling nauseous. No constipation/diarrhea. She does not check BP at home but does not note any headaches or blurry vision. She is moisturizing hands/feet with urea cream. She does note when she was off of therapy, her skin did feel much better. No signs of HFSR. No mouth sores, no changes to appetite. Energy was great while she was off of sorafenib but now starting to feel more drained, though overall manageable.     Adherence: none since restarting  Drug-Drug Interactions: none    Oncology History    No history exists.       Vital Signs for this encounter:  There were no vitals taken for this visit.  Wt Readings from Last 3 Encounters:   08/04/20 99.3 kg (219 lb)   05/20/20 (!) 104.3 kg (230 lb)   01/29/20 97.4 kg (214 lb 11.2 oz)       Medications:  Current Outpatient Medications   Medication Sig Dispense Refill   ??? lisinopril-hydrochlorothiazide (PRINZIDE,ZESTORETIC) 20-25 mg per tablet Take 1 tablet by mouth daily.     ??? oxyCODONE (ROXICODONE) 5 MG immediate release tablet Take 1-2 po q6 hrs prn pain 100 tablet 0   ??? potassium gluconate 2.5 mEq Tab Take 1 tablet by mouth.     ??? SORAfenib (NEXAVAR) 200 mg tablet Take 1 tablet (200 mg total) by mouth daily. 30 each 11   ??? therapeutic multivitamin (THERAGRAN) tablet Take 1 tablet by mouth daily.     ??? urea (CARMOL) 10 % cream APPLY NIGHTLY TO AFFECTED PRESSURE CALLOUS ON FOOT 85 g 5   ??? urea-alpha hydroxy acids 10-4 % Crea APPLY NIGHTLY TO AFFECTED PRESSURE CALLOUS ON FOOT       No current facility-administered medications for this visit.       LABS:  Lab Results   Component Value Date    WBC 9.0 08/19/2020    HGB 13.4 08/19/2020    HCT 41.2 08/19/2020    PLT 354 08/19/2020  Lab Results   Component Value Date    NA 142 08/19/2020    K 3.5 08/19/2020    CL 99 08/19/2020    CO2 28 08/19/2020    BUN 10 08/19/2020    CREATININE 0.86 08/19/2020    GLU 102 08/04/2020    CALCIUM 9.6 08/19/2020    MG 1.6 05/20/2020       Lab Results   Component Value Date    BILITOT 0.6 08/19/2020    PROT 7.9 08/19/2020    ALBUMIN 3.7 08/04/2020    ALT 17 08/19/2020    AST 28 08/19/2020    ALKPHOS 98 08/19/2020       Lab Results   Component Value Date    LABPROT 8.5 09/17/2016         I spent 15 minutes on the phone with the patient on the date of service. I spent an additional 5 minutes on pre- and post-visit activities on the date of service.     The patient was physically located in [place] or a state in which I am permitted to provide care. The patient and/or parent/guardian understood that s/he may incur co-pays and cost sharing, and agreed to the telemedicine visit. The visit was reasonable and appropriate under the circumstances given the patient's presentation at the time.    The patient and/or parent/guardian has been advised of the potential risks and limitations of this mode of treatment (including, but not limited to, the absence of in-person examination) and has agreed to be treated using telemedicine. The patient's/patient's family's questions regarding telemedicine have been answered.     If the visit was completed in an ambulatory setting, the patient and/or parent/guardian has also been advised to contact their provider???s office for worsening conditions, and seek emergency medical treatment and/or call 911 if the patient deems either necessary.    Ronnald Collum, PharmD  Hematology/Oncology Clinical Pharmacist  Pager [phone]

## 2020-08-22 DIAGNOSIS — D481 Neoplasm of uncertain behavior of connective and other soft tissue: Principal | ICD-10-CM

## 2020-08-22 MED ORDER — SORAFENIB 200 MG TABLET
ORAL_TABLET | Freq: Every day | ORAL | 11 refills | 30 days | Status: CP
Start: 2020-08-22 — End: ?
  Filled 2020-08-28: qty 30, 30d supply, fill #0

## 2020-08-25 NOTE — Unmapped (Signed)
[facility] Specialty Medication Onboarding    Specialty Medication: Nexavar 200 mg tablet  Prior Authorization: Not Required   Financial Assistance: No - copay  <$25  Final Copay/Day Supply: $3.00 / 30 day supply    Insurance Restrictions: None     Notes to Pharmacist:     The triage team has completed the benefits investigation and has determined that the patient is able to fill this medication at [facility]. Please contact the patient to complete the onboarding or follow up with the prescribing physician as needed.

## 2020-08-25 NOTE — Unmapped (Signed)
[facility]   Patient Onboarding/Medication Counseling    [name] is a 49 y.o. female with Desmoid fibromatosis who I am counseling today on continuation of therapy.  I am speaking to the patient.    Was a Nurse, learning disability used for this call? No    Verified patient's date of birth / HIPAA.    Specialty medication(s) to be sent: Hematology/Oncology: Nexavar    Non-specialty medications/supplies to be sent: none    Medications not needed at this time: none     Nexavar (sorafenib)    Medication & Administration     Dosage: Take 1 tablet (200 mg total) by mouth daily.      Administration: Take on an empty stomach: at least 1 hour before or 2 hours after food.    Adherence/Missed dose instructions: Skip the missed dose and go back to your normal time.  Do not take 2 doses at the same time or extra doses.    Goals of Therapy     ??? To slow disease progression    Side Effects & Monitoring Parameters     ??? Diarrhea  ??? Constipation  ??? Dry skin  ??? Hair loss  ??? Itching  ??? Change in taste, loss of appetite, weight loss  ??? Mouth irritation, mouth sores  ??? Muscle pain, muscle spasm, joint pain  ??? Acne  ??? Fatigue: loss of energy and strength  ??? Hypertension  ??? Hand foot syndrome  ??? Nausea  ??? Impaired Wound Healing    The following side effects should be reported to the provider:  ??? Signs of infection (fever >100.4, chills, mouth sores, sputum production)  ??? Signs of hypertension (severe headache, severe dizziness, chest pain, confusion, difficulty breathing, visual changes)  ??? Signs of thyroid problems (change in weight without trying, anxiety, agitation, feeling very weak, hair thinning, depression, neck swelling, trouble focusing, inability handling heat or cold, menstrual changes, tremors, or sweating)  ??? Signs of hand foot syndrome (redness or irritation of palms or soles of feet)  ??? Signs of Stevens-Johnson syndrome (red, swollen, blistered, or peeling skin (with or without fever); red or irritated eyes; or sores in mouth, throat, nose, or eyes)  ??? Signs of mucositis (red or swollen mouth/gums, sores in the mouth, gums or tongue, soreness or pain in the mouth or throat, difficulty swallowing or talking, dryness, mild burning, or pain when eating food white patches or pus in the mouth or on the tongue, increased mucus or thicker saliva)  ??? Signs of bleeding (vomiting or coughing up blood, blood that looks like coffee grounds, blood in the urine or black, red tarry stools, bruising that gets bigger without reason, any persistent or severe bleeding, impaired wound healing)  ??? Signs of liver problems (dark urine, abdominal pain, light-colored stools, vomiting, yellow skin or eyes)  ??? Signs of fluid and electrolyte problems (mood changes, confusion, muscle pain or weakness, abnormal/fast heartbeat, severe dizziness, passing out)  ??? Signs of kidney problems (inability to pass urine, blood in the urine, change in amount of urine passed, weight gain)  ??? Depression  ??? Sexual dysfunction  ??? Shortness of breath  ??? Excessive weight gain  ??? Severe headache, abdominal pain, nausea or vomiting  ??? Signs of anaphylaxis (wheezing, chest tightness, swelling of face, lips, tongue or throat)    Monitoring Parameters: CBC with differential, electrolytes, phosphorus, lipase and amylase levels; liver function tests; thyroid function tests; pregnancy status in females of reproductive potential; blood pressure (baseline, weekly  for the first 6 weeks, then periodic); monitor for hand-foot skin reaction and other dermatologic toxicities; monitor ECG in patients at risk for prolonged QT interval; signs/symptoms of bleeding, GI perforation, and heart failure. Monitor adherence.    Contraindications, Warnings, & Precautions     Contraindications:  ??? Known severe hypersensitivity to sorafenib or any component of the formulation  ??? Use in combination with carboplatin and paclitaxel in patients with squamous cell lung cancer    Warnings & Precautions: ??? Bleeding: Increased risk of bleeding may occur. Fatal bleeding events have been reported.   ??? Cardiovascular events: May cause cardiac ischemia or infarction. In a scientific statement from the American Heart Association, sorafenib has been determined to be an agent that may exacerbate underlying myocardial dysfunction.   ??? Dermatologic toxicity: Hand-foot skin reaction and rash (generally grades 1 or 2) are the most common drug-related adverse events, and typically appear within the first 6 weeks of treatment; severe dermatologic toxicities, including Stevens-Johnson syndrome and toxic epidermal necrolysis, have been reported; may be life-threatening.   ??? GI perforation: GI perforation has been reported with rare frequency.  ??? Hepatotoxicity: Sorafenib-induced hepatitis may result in hepatic failure and death has been observed.   ??? Hypertension: May cause hypertension (generally mild-to-moderate), especially in the first 6 weeks of treatment; monitor.  ??? QT prolongation: QT prolongation has been observed; may increase the risk for ventricular arrhythmia.  ??? Thyroid impairment: Sorafenib impairs exogenous thyroid suppression. TSH level elevations were commonly observed in the thyroid cancer study.    ??? Wound healing complications: May complicate wound healing; temporarily withhold treatment for patients undergoing major surgical procedures. The appropriate timing to resume sorafenib after major surgery has not been determined.  ??? Reproductive concerns: Evaluate pregnancy status in females of reproductive potential prior to initiating sorafenib treatment. Females of reproductive potential should use an effective non-hormonal method of contraception during treatment and for 6 months after the final sorafenib dose. Males with female partners of reproductive potential should use effective contraception during treatment and for 3 months after the last sorafenib dose. Based on the mechanism of action and findings from animal reproduction studies, adverse effects on pregnancy would be expected. Sorafenib inhibits angiogenesis, which is a critical component of fetal development.  ??? Breastfeeding considerations: It is not known if sorafenib is present in breast milk. Due to the potential for serious adverse reactions in the breastfed infant, the manufacturer recommends discontinuing breastfeeding during sorafenib treatment and for 2 weeks after the final sorafenib dose    Drug/Food Interactions     ??? Medication list reviewed in Epic. The patient was instructed to inform the care team before taking any new medications or supplements. No drug interactions identified.   ??? Avoid live vaccines.      Storage, Handling Precautions, & Disposal     ??? Store at room temperature in the original container (do not use a pillbox or store with other medications).   ??? Caregivers helping administer medication should wear gloves and wash hands immediately after.    ??? Keep the lid tightly closed. Keep out of the reach of children and pets.  ??? Do not flush down a toilet or pour down a drain unless instructed to do so.  Check with your local police department or fire station about drug take-back programs in your area.      The patient declined counseling on side effects and monitoring parameters and warnings and precautions because they have taken the medication previously. The information  in the declined sections above are for informational purposes only and was not discussed with patient.       Current Medications (including OTC/herbals), Comorbidities and Allergies     Current Outpatient Medications   Medication Sig Dispense Refill   ??? lisinopril-hydrochlorothiazide (PRINZIDE,ZESTORETIC) 20-25 mg per tablet Take 1 tablet by mouth daily.     ??? ondansetron (ZOFRAN) 8 MG tablet Take 1 tablet (8 mg total) by mouth every eight (8) hours as needed for nausea. 30 tablet 1   ??? oxyCODONE (ROXICODONE) 5 MG immediate release tablet Take 1-2 po q6 hrs prn pain 100 tablet 0   ??? potassium gluconate 2.5 mEq Tab Take 1 tablet by mouth.     ??? SORAfenib (NEXAVAR) 200 mg tablet Take 1 tablet (200 mg total) by mouth daily. 30 tablet 11   ??? therapeutic multivitamin (THERAGRAN) tablet Take 1 tablet by mouth daily.     ??? urea (CARMOL) 10 % cream APPLY NIGHTLY TO AFFECTED PRESSURE CALLOUS ON FOOT 85 g 5   ??? urea-alpha hydroxy acids 10-4 % Crea APPLY NIGHTLY TO AFFECTED PRESSURE CALLOUS ON FOOT       No current facility-administered medications for this visit.       Allergies   Allergen Reactions   ??? Augmentin [Amoxicillin-Pot Clavulanate] Hives   ??? Penicillins Hives       Patient Active Problem List   Diagnosis   ??? Desmoid fibromatosis   ??? Rash of face   ??? Chronic left shoulder pain   ??? Callous ulcer, limited to breakdown of skin (CMS-HCC)       Reviewed and up to date in Epic.    Appropriateness of Therapy     Is medication and dose appropriate based on diagnosis? Yes    Prescription has been clinically reviewed: Yes    Baseline Quality of Life Assessment      How many days over the past month did your Desmoid fibromatosis  keep you from your normal activities? For example, brushing your teeth or getting up in the morning. 0    Financial Information     Medication Assistance provided: None Required    Anticipated copay of $3.00 / 30 day supply reviewed with patient. Verified delivery address.    Delivery Information     Scheduled delivery date: 08/29/20    Expected start date: continuing therapy. Patient has 7 days of medication on hand.     Medication will be delivered via UPS to the prescription address in [facility].  This shipment will not require a signature.      Explained the services we provide at [facility] and that each month we would call to set up refills.  Stressed importance of returning phone calls so that we could ensure they receive their medications in time each month.  Informed patient that we should be setting up refills 7-10 days prior to when they will run out of medication.  A pharmacist will reach out to perform a clinical assessment periodically.  Informed patient that a welcome packet and a drug information handout will be sent.      Patient verbalized understanding of the above information as well as how to contact the pharmacy at [phone] option 4 with any questions/concerns.  The pharmacy is open Monday through Friday 8:30am-4:30pm.  A pharmacist is available 24/7 via pager to answer any clinical questions they may have.    Patient Specific Needs     - Does the patient  have any physical, cognitive, or cultural barriers? No    - Patient prefers to have medications discussed with  Patient     - Is the patient or caregiver able to read and understand education materials at a high school level or above? Yes    - Patient's primary language is  English     - Is the patient high risk? Yes, patient is taking oral chemotherapy. Appropriateness of therapy as been assessed    - Does the patient require a Care Management Plan? No     - Does the patient require physician intervention or other additional services (i.e. nutrition, smoking cessation, social work)? No      Adrienne N. Lebron Conners [facility] Specialty Pharmacist  I reviewed this patient case and all documentation provided by the learner and was readily available for consultation during their interaction with the patient.  I agree with the assessment and plan listed below.    Breck Coons [facility] Specialty Pharmacist

## 2020-08-26 MED ORDER — OXYCODONE 5 MG TABLET
ORAL_TABLET | 0 refills | 0 days | Status: CP
Start: 2020-08-26 — End: ?

## 2020-08-26 NOTE — Unmapped (Signed)
Returned call to pt. Let her know script was sent to pharmacy as requested.She will call back with any further needs.

## 2020-08-26 NOTE — Unmapped (Signed)
Hi,    Patient Heidi Woodard called requesting a medication refill for the following:    ??? Medication: Oxycodone  ??? Dosage: 5mg   ??? Days left of medication: 0  ??? Pharmacy: [facility]        The expected turnaround time is 3-4 business days       Check Indicates criteria has been reviewed and confirmed with the patient:    [x]  Preferred Name   [x]  DOB and/or MR#  [x]  Preferred Contact Method  [x]  Phone Number(s)   [x]  Preferred Pharmacy   []  MyChart     Thank you,  Jacques Navy  [facility]  [phone]

## 2020-08-26 NOTE — Unmapped (Signed)
Reviewed PDMP.  Pt last filled oxycodone 5 mg #100 on 07/28/20.  Refill is appropriate for cancer-related pain.  Rx for oxycodone 5 mg #100 sent to local pharmacy.

## 2020-08-27 MED ORDER — OXYCODONE 5 MG TABLET
ORAL_TABLET | 0 refills | 0 days
Start: 2020-08-27 — End: ?

## 2020-08-27 NOTE — Unmapped (Signed)
Hi,     Ruben contacted the [employer] requesting to speak with the care team of SEMONE ORLOV to discuss:    At the [facility] now and they told her they haven't received the Oxycodone Rx.    Please contact at [phone].      Check Indicates criteria has been reviewed and confirmed with the patient:    []  Preferred Name   [x]  DOB and/or MR#  [x]  Preferred Contact Method  [x]  Phone Number(s)   []  MyChart     Thank you,   [name]  [facility]   [phone]

## 2020-08-28 MED ORDER — OXYCODONE 5 MG TABLET
ORAL_TABLET | 0 refills | 0 days | Status: CP
Start: 2020-08-28 — End: ?

## 2020-08-28 MED FILL — NEXAVAR 200 MG TABLET: 30 days supply | Qty: 30 | Fill #0 | Status: AC

## 2020-08-28 NOTE — Unmapped (Signed)
Hi,     Arlean contacted the [employer] regarding the following:    - States she went to pick up the Oxycodone and was told that the date for the prescription was a issue. States she was told a new prescription is needed.     Please contact Star at [phone] .    Thanks in advance,    Drema Balzarine  [facility]   [phone]

## 2020-08-28 NOTE — Unmapped (Signed)
RN called [facility] to clarify why they could not fill the script that was sent to them on 10-26 with a confirmed receipt notice.  The pharmacist reports that the script was signed by Louann Liv and her DEA is coming back as expired.  They will need a new script signed by someone else in order to fill until Heidi can resolve the issue with her DEA number.  Heidi Woodard notified via [employer].

## 2020-08-28 NOTE — Unmapped (Signed)
Returned call to pt. Let her know request for refill has been sent to provider. Will let her know when script sent to pharmacy.

## 2020-08-28 NOTE — Unmapped (Signed)
Clinical Pharmacist Practitioner: Telephone Note    Reason for Call - Medication Access    I called Ms. Insley's [facility] pharmacy to clarify oxycodone prescription. Received a message that the prescriber's DEA number was expired. Confirmed with the pharmacy that they have the correct DEA number. In their record, the DEA number expired in 03/2020, and they are not able fill the prescription.    I spent 5 minutes in coordinating patient care.    Nicholos Johns, PharmD, CPP  Clinical Pharmacist Practitioner, Thoracic Oncology & Sarcoma  Pager: [phone]

## 2020-08-28 NOTE — Unmapped (Signed)
Called pt to let her know script has been sent to the pharmacy as requested.

## 2020-09-22 MED ORDER — OXYCODONE 5 MG TABLET
ORAL_TABLET | 0 refills | 0 days
Start: 2020-09-22 — End: ?

## 2020-09-22 NOTE — Unmapped (Signed)
Hi,    Patient MELODI HAPPEL called requesting a medication refill for the following:    ??? Medication: Roxicodone  ??? Dosage: 5 MG  ??? Days left of medication: 0  ??? Pharmacy: [facility]      The expected turnaround time is 3-4 business days       Check Indicates criteria has been reviewed and confirmed with the patient:    [x]  Preferred Name   [x]  DOB and/or MR#  [x]  Preferred Contact Method  [x]  Phone Number(s)   [x]  Preferred Pharmacy   []  MyChart     Thank you,  Drema Balzarine  [facility] Communication [facility]  [phone]

## 2020-09-22 NOTE — Unmapped (Signed)
[facility] Clinical Assessment & Refill Coordination Note    Heidi Woodard, DOB: 1971-09-21  Phone: [phone] (home) [phone] (work)    All above HIPAA information was verified with patient.     Was a Nurse, learning disability used for this call? No    Specialty Medication(s):   Hematology/Oncology: Nexavar     Current Outpatient Medications   Medication Sig Dispense Refill   ??? lisinopril-hydrochlorothiazide (PRINZIDE,ZESTORETIC) 20-25 mg per tablet Take 1 tablet by mouth daily.     ??? ondansetron (ZOFRAN) 8 MG tablet Take 1 tablet (8 mg total) by mouth every eight (8) hours as needed for nausea. 30 tablet 1   ??? oxyCODONE (ROXICODONE) 5 MG immediate release tablet Take 1-2 po q6 hrs prn pain 100 tablet 0   ??? potassium gluconate 2.5 mEq Tab Take 1 tablet by mouth.     ??? SORAfenib (NEXAVAR) 200 mg tablet Take 1 tablet (200 mg total) by mouth daily. 30 tablet 11   ??? therapeutic multivitamin (THERAGRAN) tablet Take 1 tablet by mouth daily.     ??? urea (CARMOL) 10 % cream APPLY NIGHTLY TO AFFECTED PRESSURE CALLOUS ON FOOT 85 g 5   ??? urea-alpha hydroxy acids 10-4 % Crea APPLY NIGHTLY TO AFFECTED PRESSURE CALLOUS ON FOOT       No current facility-administered medications for this visit.        Changes to medications: Heidi Woodard reports no changes at this time.    Allergies   Allergen Reactions   ??? Augmentin [Amoxicillin-Pot Clavulanate] Hives   ??? Penicillins Hives       Changes to allergies: No    SPECIALTY MEDICATION ADHERENCE     Nexavar 200 mg: 8 - 10 days of medicine on hand     Medication Adherence    Patient reported X missed doses in the last month: 0  Specialty Medication: sorafenib 200 mg daily  Patient is on additional specialty medications: No  Informant: patient  Confirmed plan for next specialty medication refill: delivery by pharmacy          Specialty medication(s) dose(s) confirmed: Regimen is correct and unchanged.     Are there any concerns with adherence? No    Adherence counseling provided? Not needed    CLINICAL MANAGEMENT AND INTERVENTION      Clinical Benefit Assessment:    Do you feel the medicine is effective or helping your condition? Yes    Clinical Benefit counseling provided? Not needed    Adverse Effects Assessment:    Are you experiencing any side effects? No    Are you experiencing difficulty administering your medicine? 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 For example, brushing your teeth or getting up in the morning. 0    Have you discussed this with your provider? Not needed    Therapy Appropriateness:    Is therapy appropriate? Yes, therapy is appropriate and should be continued    DISEASE/MEDICATION-SPECIFIC INFORMATION      N/A    PATIENT SPECIFIC NEEDS     - Does the patient have any physical, cognitive, or cultural barriers? No    - Is the patient high risk? Yes, patient is taking oral chemotherapy. Appropriateness of therapy as been assessed    - Does the patient require a Care Management Plan? No     - Does the patient require  physician intervention or other additional services (i.e. nutrition, smoking cessation, social work)? No      SHIPPING     Specialty Medication(s) to be Shipped:   Hematology/Oncology: Nexavar    Other medication(s) to be shipped: No additional medications requested for fill at this time     Changes to insurance: No    Delivery Scheduled: Yes, Expected medication delivery date: 09/24/20.     Medication will be delivered via UPS to the confirmed prescription address in [facility]    The patient will receive a drug information handout for each medication shipped and additional FDA Medication Guides as required.  Verified that patient has previously received a Conservation officer, historic buildings.    All of the patient's questions and concerns have been addressed.    Breck Coons [facility] Specialty Pharmacist

## 2020-09-23 MED FILL — NEXAVAR 200 MG TABLET: 30 days supply | Qty: 30 | Fill #1 | Status: AC

## 2020-09-23 MED FILL — NEXAVAR 200 MG TABLET: ORAL | 30 days supply | Qty: 30 | Fill #1

## 2020-09-29 NOTE — Unmapped (Signed)
Hi,    Patient ADRAINE BIFFLE called requesting a medication refill for the following:    ??? Medication: Oxycodone  ??? Dosage: 5 Mg  ??? Days left of medication: 0  ??? Pharmacy: [facility]    Program: Head and Neck  Speciality: Medical Oncology     The expected turnaround time is 3-4 business days       Check Indicates criteria has been reviewed and confirmed with the patient:    []  Preferred Name   [x]  DOB and/or MR#  [x]  Preferred Contact Method  [x]  Phone Number(s)   [x]  Preferred Pharmacy   []  MyChart     Thank you,  [name]  [facility] Cancer Communication Center  [phone]

## 2020-09-30 NOTE — Unmapped (Signed)
Hi,    Patient AAISHA SLITER called requesting a medication refill for the following:    ??? Medication:Oxycodone  ??? Dosage:5 mg  ??? Days left of medication: 0  ??? Pharmacy: [facility]      The expected turnaround time is 3-4 business days       Check Indicates criteria has been reviewed and confirmed with the patient:    [x]  Preferred Name   [x]  DOB and/or MR#  [x]  Preferred Contact Method  [x]  Phone Number(s)   [x]  Preferred Pharmacy   []  MyChart     Thank you,  [name]  [facility] Cancer Communication Center  [phone]

## 2020-10-01 MED ORDER — OXYCODONE 5 MG TABLET
ORAL_TABLET | 0 refills | 0 days | Status: CP
Start: 2020-10-01 — End: 2020-10-02

## 2020-10-01 NOTE — Unmapped (Signed)
Hi,    Patient Heidi Woodard called requesting a medication refill for the following:    ??? Medication: Roxicodone  ??? Dosage: 5 MG  ??? Days left of medication: 0  ??? Pharmacy: [facility]      The expected turnaround time is 3-4 business days       Check Indicates criteria has been reviewed and confirmed with the patient:    [x]  Preferred Name   [x]  DOB and/or MR#  [x]  Preferred Contact Method  [x]  Phone Number(s)   [x]  Preferred Pharmacy   []  MyChart     Thank you,  Drema Balzarine  [facility] Cancer Communication Center  [phone]

## 2020-10-02 MED ORDER — OXYCODONE 5 MG TABLET
ORAL_TABLET | ORAL | 0 refills | 0.00000 days | Status: CP
Start: 2020-10-02 — End: 2020-11-04

## 2020-10-02 NOTE — Unmapped (Signed)
Called pt to let her know refill has been sent to pharmacy as requested.She will call back with any further needs.

## 2020-10-02 NOTE — Unmapped (Signed)
Hi,    Patient Heidi Woodard contacted the Oncology Communication Center today stating that she contacted the Pharmacy to pick up her medication and the Pharmacy stated that the PA that signed off on the medication is not authorized. Please straighten this out so that I am able to receive my medication. Contact the pharmacy at [phone]. Then once the Pharmacy has been contacted please call me back at [phone] to let me know this has been taken care of.     .Thank you,   Noland Fordyce  [facility] Communication Center  [phone]

## 2020-10-02 NOTE — Unmapped (Signed)
Called pt to let her know refill has been sent to pharmacy as requested. Will call with any further needs.

## 2020-10-02 NOTE — Unmapped (Signed)
Reviewed PDMP.  Oxycodone 5 mg #100 refilled for PRN use for pain 2/2 desmoid tumor.  Typically requires 100 tabs monthly.  Last filled 08/28/20.

## 2020-10-02 NOTE — Unmapped (Signed)
Clinical Pharmacist Practitioner: Telephone Note    Reason for Call - Medication Access    I called [facility] regarding Ms. Lamphier's oxycodone prescription. Pharmacist stated that Merleen Nicely was not authorized under [place] Medicaid system to prescribe controlled substances. Resent an Rx under my name which went through (confirmed with pharmacist on the phone).     PDMP reviewed - consistent opioid use ~100 tab every 30d for the last ~10 months. New prescription sent for oxycodone 100 tablets with 0 refills. Next refill due ~11/01/2020.     I spent 5 minutes in coordinating patient care.    Nicholos Johns, PharmD, CPP  Clinical Pharmacist Practitioner, Thoracic Oncology & Sarcoma  Pager: [phone]

## 2020-10-06 ENCOUNTER — Ambulatory Visit: Admit: 2020-10-06 | Discharge: 2020-10-07 | Payer: BLUE CROSS/BLUE SHIELD

## 2020-10-06 ENCOUNTER — Other Ambulatory Visit: Admit: 2020-10-06 | Discharge: 2020-10-07 | Payer: BLUE CROSS/BLUE SHIELD

## 2020-10-06 DIAGNOSIS — D481 Neoplasm of uncertain behavior of connective and other soft tissue: Principal | ICD-10-CM

## 2020-10-06 DIAGNOSIS — L98491 Non-pressure chronic ulcer of skin of other sites limited to breakdown of skin: Principal | ICD-10-CM

## 2020-10-06 DIAGNOSIS — G8929 Other chronic pain: Principal | ICD-10-CM

## 2020-10-06 DIAGNOSIS — M25512 Pain in left shoulder: Principal | ICD-10-CM

## 2020-10-06 LAB — CBC W/ AUTO DIFF
BASOPHILS ABSOLUTE COUNT: 0 10*9/L (ref 0.0–0.1)
BASOPHILS RELATIVE PERCENT: 0.5 %
EOSINOPHILS ABSOLUTE COUNT: 0.2 10*9/L (ref 0.0–0.4)
EOSINOPHILS RELATIVE PERCENT: 3.2 %
HEMATOCRIT: 39.8 % (ref 36.0–46.0)
HEMOGLOBIN: 12.9 g/dL (ref 12.0–16.0)
LARGE UNSTAINED CELLS: 1 % (ref 0–4)
LYMPHOCYTES ABSOLUTE COUNT: 3.3 10*9/L (ref 1.5–5.0)
LYMPHOCYTES RELATIVE PERCENT: 44.6 %
MEAN CORPUSCULAR HEMOGLOBIN CONC: 32.5 g/dL (ref 31.0–37.0)
MEAN CORPUSCULAR HEMOGLOBIN: 27.9 pg (ref 26.0–34.0)
MEAN CORPUSCULAR VOLUME: 86 fL (ref 80.0–100.0)
MEAN PLATELET VOLUME: 9.1 fL (ref 7.0–10.0)
MONOCYTES ABSOLUTE COUNT: 0.4 10*9/L (ref 0.2–0.8)
MONOCYTES RELATIVE PERCENT: 4.7 %
NEUTROPHILS ABSOLUTE COUNT: 3.4 10*9/L (ref 2.0–7.5)
NEUTROPHILS RELATIVE PERCENT: 45.6 %
PLATELET COUNT: 328 10*9/L (ref 150–440)
RED BLOOD CELL COUNT: 4.62 10*12/L (ref 4.00–5.20)
RED CELL DISTRIBUTION WIDTH: 15.3 % — ABNORMAL HIGH (ref 12.0–15.0)
WBC ADJUSTED: 7.4 10*9/L (ref 4.5–11.0)

## 2020-10-06 LAB — COMPREHENSIVE METABOLIC PANEL
ALBUMIN: 3.7 g/dL (ref 3.4–5.0)
ALKALINE PHOSPHATASE: 88 U/L (ref 46–116)
ALT (SGPT): 15 U/L (ref 10–49)
ANION GAP: 6 mmol/L (ref 5–14)
AST (SGOT): 23 U/L (ref <=34)
BILIRUBIN TOTAL: 0.5 mg/dL (ref 0.3–1.2)
BLOOD UREA NITROGEN: 18 mg/dL (ref 9–23)
BUN / CREAT RATIO: 20
CALCIUM: 9.5 mg/dL (ref 8.7–10.4)
CHLORIDE: 99 mmol/L (ref 98–107)
CO2: 32 mmol/L — ABNORMAL HIGH (ref 20.0–31.0)
CREATININE: 0.88 mg/dL — ABNORMAL HIGH
EGFR CKD-EPI AA FEMALE: 88 mL/min/{1.73_m2} (ref >=60)
EGFR CKD-EPI NON-AA FEMALE: 76 mL/min/{1.73_m2} (ref >=60)
GLUCOSE RANDOM: 92 mg/dL (ref 70–179)
POTASSIUM: 3.2 mmol/L — ABNORMAL LOW (ref 3.4–4.5)
PROTEIN TOTAL: 7.9 g/dL (ref 5.7–8.2)
SODIUM: 137 mmol/L (ref 135–145)

## 2020-10-06 NOTE — Unmapped (Addendum)
UPDATES:  --potassium still low.  Will send results to [facility].  Call their office.  --get blood pressure cuff; monitor BP at home; call if BP >140/90  --continue using urea cream up to twice a day  --continue on sorafenib 200 mg daily  --push fluids  --get covid booster      ?? Support for cancer patients and their caregivers during the COVID-19 pandemic [email or web address]  ?? Keeping cancer patients safe during the COVID-19 crisis [email or web address]    +++++++++++++++++++++++++++++++++++++++++++++++++++++++++++  COVID-19 Vaccines:  ?? Schedule an appointment online or call [phone] (M-F 8am-5pm).  ?? For more information on how to get a vaccine, visit Get Vaccinated.  ?? [email or web address]   +++++++++++++++++++++++++++++++++++++++++++++++++++++++++++      WHAT YOU SHOULD KNOW ABOUT THE COVID-19 VACCINES   People with cancer are at an increased risk for sickness and death from COVID-19. We recommend patients with cancer, including those who are receiving cancer treatment, be vaccinated as soon as they are eligible.   Know your platelet count. Some patients with specific cancers or those receiving certain treatments may have a low platelet count (part of the blood that helps with clotting). If this is you, talk with your care team prior to getting vaccinated.   Bone marrow transplant patients should wait until at least 3 months after transplant. Patients who have undergone a stem cell transplant (SCT) or Car-T/Cellular Therapy (CT) will need to wait at least 3 months after their transplant to receive a vaccine.   The vaccines work.   Scientists found the vaccines could help prevent people from getting sick with COVID-19.   The vaccines are safe. The COVID-19 vaccines went through every stage of clinical trials. More than 70,000 people of all different races and ethnicities participated in the COVID-19 vaccine trials. Health professionals will continue to monitor the vaccines' safety and effectiveness against the virus.   Very few people experience serious side effects. Just like other vaccines, the COVID-19 vaccines can cause minor side effects. If you are worried about potential side effects, contact your doctor.   You will need two doses of the vaccine. This will help your body build up a strong resistance to COVID-19. You will get your second dose three to four weeks after your first one.   Some people should talk to their doctor first. People who have serious allergies and/or are pregnant, breastfeeding or immunocompromised should talk with their doctor before receiving a COVID-19 vaccine.   If you have had COVID-19, you should still get the vaccines. It is possible to get COVID-19 more than once and the vaccines will help to prevent you from getting sick again.   Please continue to wear a mask and practice physical distancing. Even after you get a COVID-19 vaccine, you should continue to wear a mask, wash your hands regularly and stay 6 feet apart. Thank you for helping protect yourself and your community!   +++++++++++++++++++++++++++++++++++++++++++++++++++++++++++  Your nurse navigator is:  [name], RN, BSN, OCN   Head & Neck/Sarcoma  Oncology Nurse Navigator  [email or web address]     For appointments & questions Monday through Friday 8 AM??? 5 PM   please call [phone] or Toll free [phone].     On Nights, Weekends and Holidays  Call [phone] and ask for the adult hematologist-oncologist on call.     [facility]  [address]  [place], [place] [address]  [email or web address]  Tel: [phone] / Toll Free [phone]  Fax: [phone]  Labs from today:  Lab on 10/06/2020   Component Date Value Ref Range Status   ??? Sodium 10/06/2020 137  135 - 145 mmol/L Final   ??? Potassium 10/06/2020 3.2* 3.4 - 4.5 mmol/L Final   ??? Chloride 10/06/2020 99  98 - 107 mmol/L Final   ??? Anion Gap 10/06/2020 6  5 - 14 mmol/L Final   ??? CO2 10/06/2020 32.0* 20.0 - 31.0 mmol/L Final   ??? BUN 10/06/2020 18  9 - 23 mg/dL Final   ??? Creatinine 10/06/2020 0.88* 0.60 - 0.80 mg/dL Final   ??? BUN/Creatinine Ratio 10/06/2020 20   Final   ??? EGFR CKD-EPI Non-African American,* 10/06/2020 76  >=60 mL/min/1.44m2 Final   ??? EGFR CKD-EPI African American, Fem* 10/06/2020 88  >=60 mL/min/1.54m2 Final   ??? Glucose 10/06/2020 92  70 - 179 mg/dL Final   ??? Calcium 16/08/9603 9.5  8.7 - 10.4 mg/dL Final   ??? Albumin 54/07/8118 3.7  3.4 - 5.0 g/dL Final   ??? Total Protein 10/06/2020 7.9  5.7 - 8.2 g/dL Final   ??? Total Bilirubin 10/06/2020 0.5  0.3 - 1.2 mg/dL Final   ??? AST 14/78/2956 23  <=34 U/L Final   ??? ALT 10/06/2020 15  10 - 49 U/L Final   ??? Alkaline Phosphatase 10/06/2020 88  46 - 116 U/L Final   ??? WBC 10/06/2020 7.4  4.5 - 11.0 10*9/L Final   ??? RBC 10/06/2020 4.62  4.00 - 5.20 10*12/L Final   ??? HGB 10/06/2020 12.9  12.0 - 16.0 g/dL Final   ??? HCT 21/30/8657 39.8  36.0 - 46.0 % Final   ??? MCV 10/06/2020 86.0  80.0 - 100.0 fL Final   ??? MCH 10/06/2020 27.9  26.0 - 34.0 pg Final   ??? MCHC 10/06/2020 32.5  31.0 - 37.0 g/dL Final   ??? RDW 84/69/6295 15.3* 12.0 - 15.0 % Final   ??? MPV 10/06/2020 9.1  7.0 - 10.0 fL Final   ??? Platelet 10/06/2020 328  150 - 440 10*9/L Final   ??? Variable HGB Concentration 10/06/2020 Slight* Not Present Final   ??? Neutrophils % 10/06/2020 45.6  % Final   ??? Lymphocytes % 10/06/2020 44.6  % Final   ??? Monocytes % 10/06/2020 4.7  % Final   ??? Eosinophils % 10/06/2020 3.2  % Final   ??? Basophils % 10/06/2020 0.5  % Final   ??? Absolute Neutrophils 10/06/2020 3.4  2.0 - 7.5 10*9/L Final   ??? Absolute Lymphocytes 10/06/2020 3.3  1.5 - 5.0 10*9/L Final   ??? Absolute Monocytes 10/06/2020 0.4  0.2 - 0.8 10*9/L Final   ??? Absolute Eosinophils 10/06/2020 0.2  0.0 - 0.4 10*9/L Final   ??? Absolute Basophils 10/06/2020 0.0  0.0 - 0.1 10*9/L Final   ??? Large Unstained Cells 10/06/2020 1  0 - 4 % Final   ??? Hypochromasia 10/06/2020 Slight* Not Present Final

## 2020-10-06 NOTE — Unmapped (Unsigned)
Labs drawn peripherally by Arin Vanosdol.

## 2020-10-06 NOTE — Unmapped (Signed)
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 Continue prn 1% hydrocort cream to face(OTC), triamcinolone cream to body, sunscreen or avoidance, and to call if she has recurrent issues. PRN benadryl for itching with rash/flaking skin. Emolliants after bathing (cocoa butter ok).  Cornstarch or baby powder to skin folds to minimize chafing.   - Continue urea cream on callouses on feet and also along neck.   - MRI Left shoulder, 02/08/20, similar to some decreased bulk the large fibrotic mass posterior to the left shoulder   - Pt states she has 100% compliance with sorafenib, no issues with refills or copays.    - 05/20/20: held sorafenib in anticipation of TKR on 05/26/20  - 08/04/20: MRI Left shoulder, similar size of fibrotic mass posterior left shoulder.  Pt reports increase in pain/swelling associated with tumor.  Restarted sorafenib 200 mg daily.    - 10/06/20: 2 month f/u on sorafenib 200 mg daily.  Doing ok overall with tolerable HFS, nausea.  Improvement in desmoid associated pain.  Plan f/u in 2 months + labs and scans again in 4 months.     -Possible options:   - doxil   - ongoing sorafenib (I dont think she would tolerate dose escalation)   - observation (sometimes these tumors burn out)   - likely that later this fall a gamma secretase inhibitor may be approved later this year    Hypokalemia, likely due to lisinopril/HCTZ  - Pt to follow-up with PCP to address.  On oral potassium, but unclear what dose  - Will send note    Essential HTN - likely exacerbated by sorafenib  - asked patient to get BP cuff and call if BP continues >140/90  - continues on hydrochlorothiazide/lisinopril    Desmoid tumor associated pain - now improved with restarting sorafenib  - Requiring two oxycodone 5 mg tabs daily; last filled 10/02/20.     HFS, mild   - continue urea 10-20% cream bid as needed, likely be able to discontinue off sorafenib. Continue aggressive emollient use to prevent dry skin    Birth control: Says she is not sexually active. Awareof teratogenic risk of pregnancy on sorafenib.    Dispo:  F/U in clinic in 2 months + labs.  C/W q6 mo imaging (April 2022), sooner if necessary    I personally spent a total of 40 minutes in care of the patient on the day of service, including 30 min in direct face to face discussion/counseling regarding disease/prognosis/treatment.    I personally spent 40 minutes face-to-face and non-face-to-face in the care of this patient, which includes all pre, intra, and post visit time on the date of service.    REASON FOR CONSULTATION:   Heidi Woodard is a 49 y.o. female who is seen in follow up for her desmoid fibromatosis.      HISTORY OF PRESENT ILLNESS:    About a year ago, pt  developed shoulder pain associated with a fall. There was bruising and swelling but she attributed it to the injury. It never improved, and this summer she noted increased swelling and decreased ROM (which is limited by pain, rather than painless weakness). 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 She underwent CT of the left shoudler on 06/25/16 which showed a 16 cm mass involing the left deltoid with likely invasion of the left trapezius, triceps, and teres minor. Possible LN involvement of axillary and supraclav LN on left were present. On 07/07/2016 she saw Dr. Darral Dash and ultrasound-guided biopsy was performed with pathology revealing low-grade spindle cell lesion favoring fibromatosis. He felt that surgical resection would be highly morbid. On 9/20/017, she saw Dr Beckey Rutter, who did not favor RT due to inherent risks associated with RT.     She has ongoing pain, some tingling along the backs of her hands. Pain is intermittent if her arm is at rest, happening about every other day but when it happens it is very intense. Per above it limits her ADLs although she says she is managing.      Interval history:     Continues on sorafenib 200mg  nightly. Says she has not missed any doses for any reason.  Doing ok overall with restarting sorafenib.    Pain at tumor site has improved as has swelling.  Requiring oxycodone 5 mg two tabs per day; down from 3 tabs per day.  Mild nausea; using zofran successfully 1 per week  Not checking bp at home; higher bp she attributes to recent death of her father  Blisters on feet x 2; otherwise no significant skin changes; using urea cream bid  No skin rash    REVIEW OF SYSTEMS:  A comprehensive review of 14 systems was negative except for pertinent positives noted in HPI.    PMH: denies    PSH: denies    FHx: no fhx of FAP or desmoid tumors     Social History     Occupational History   ??? Not on file   Tobacco Use   ??? Smoking status: Never Smoker   ??? Smokeless tobacco: Never Used   Substance and Sexual Activity   ??? Alcohol use: No   ??? Drug use: No   ??? Sexual activity: Not on file       ALLERGIES/MEDICATIONS:  Reviewed in EPIC    PHYSICAL EXAM:   General: in NAD, pleasant and conversant  VS: BP 146/99  - Pulse 72  - Temp 36.5 ??C (97.7 ??F) (Oral)  - Resp 18  - Ht 160 cm (5' 3)  - Wt 100 kg (220 lb 6.4 oz)  - SpO2 97%  - BMI 39.04 kg/m??   HEENT: EOMI, MMM  Neck: no thyromegaly, no cervical LAD  Cardio: RRR  Resp: CTA b/l, no increased WOB; poor inspiratory effort  Abd: soft, NTND, +BS, no HSM  Extr: warm, no lower extremity edema  MSK: palpable 6 cm mass L shoulder with mild pain to the touch, full ROM (Though left with slightly more difficulty)  Skin: two blisters on feet (big toe and ball of foot)  Neuro: alert and oriented, no focal deficits  Psych: appropriate mood and affect    +++++++++++++++++++++++++++++++++++++++++++++++++++++++++++     PATHOLOGY:   Pathology was personally reviewed as described in the HPI, detailed below.   07/07/2016  Surgical pathology exam  Final Diagnosis  A: Shoulder, left, biopsy   - Low grade spindle cell lesion, favor fibromatosis  - The lesional cells are positive for beta-catenin, weakly positive for smooth muscle actin and desmin, and negative for S100, supporting the interpretation of fibromatosis  - Dr. Pollie Friar concurs  Addendum:  Dr. Morrison Old dictating: The last section of the impression should read:     Multiple mildly prominent left supraclavicular and axillary lymph nodes, metastatic disease cannot be excluded.    LABS:    Reviewed in epic  Lab on 10/06/2020   Component Date Value Ref Range Status   ??? Sodium 10/06/2020 137  135 - 145 mmol/L Final   ??? Potassium 10/06/2020 3.2* 3.4 - 4.5 mmol/L Final   ??? Chloride 10/06/2020 99  98 - 107 mmol/L Final   ??? Anion Gap 10/06/2020 6  5 - 14 mmol/L Final   ??? CO2 10/06/2020 32.0* 20.0 - 31.0 mmol/L Final   ??? BUN 10/06/2020 18  9 - 23 mg/dL Final   ??? Creatinine 10/06/2020 0.88* 0.60 - 0.80 mg/dL Final   ??? BUN/Creatinine Ratio 10/06/2020 20   Final   ??? EGFR CKD-EPI Non-African American,* 10/06/2020 76  >=60 mL/min/1.24m2 Final   ??? EGFR CKD-EPI African American, Fem* 10/06/2020 88  >=60 mL/min/1.74m2 Final   ??? Glucose 10/06/2020 92  70 - 179 mg/dL Final   ??? Calcium 29/56/2130 9.5  8.7 - 10.4 mg/dL Final   ??? Albumin 86/57/8469 3.7  3.4 - 5.0 g/dL Final   ??? Total Protein 10/06/2020 7.9  5.7 - 8.2 g/dL Final   ??? Total Bilirubin 10/06/2020 0.5  0.3 - 1.2 mg/dL Final   ??? AST 62/95/2841 23  <=34 U/L Final   ??? ALT 10/06/2020 15  10 - 49 U/L Final   ??? Alkaline Phosphatase 10/06/2020 88  46 - 116 U/L Final   ??? WBC 10/06/2020 7.4  4.5 - 11.0 10*9/L Final   ??? RBC 10/06/2020 4.62  4.00 - 5.20 10*12/L Final   ??? HGB 10/06/2020 12.9  12.0 - 16.0 g/dL Final   ??? HCT 32/44/0102 39.8  36.0 - 46.0 % Final   ??? MCV 10/06/2020 86.0  80.0 - 100.0 fL Final   ??? MCH 10/06/2020 27.9  26.0 - 34.0 pg Final   ??? MCHC 10/06/2020 32.5  31.0 - 37.0 g/dL Final   ??? RDW 72/53/6644 15.3* 12.0 - 15.0 % Final   ??? MPV 10/06/2020 9.1  7.0 - 10.0 fL Final   ??? Platelet 10/06/2020 328  150 - 440 10*9/L Final   ??? Variable HGB Concentration 10/06/2020 Slight* Not Present Final   ??? Neutrophils % 10/06/2020 45.6  % Final   ??? Lymphocytes % 10/06/2020 44.6  % Final   ??? Monocytes % 10/06/2020 4.7  % Final   ??? Eosinophils % 10/06/2020 3.2  % Final   ??? Basophils % 10/06/2020 0.5  % Final   ??? Absolute Neutrophils 10/06/2020 3.4  2.0 - 7.5 10*9/L Final   ??? Absolute Lymphocytes 10/06/2020 3.3  1.5 - 5.0 10*9/L Final   ??? Absolute Monocytes 10/06/2020 0.4  0.2 - 0.8 10*9/L Final   ??? Absolute Eosinophils 10/06/2020 0.2  0.0 - 0.4 10*9/L Final   ??? Absolute Basophils 10/06/2020 0.0  0.0 - 0.1 10*9/L Final   ??? Large Unstained Cells 10/06/2020 1  0 - 4 % Final   ??? Hypochromasia 10/06/2020 Slight* Not Present Final     RADIOLOGY:  See epic

## 2020-10-06 NOTE — Unmapped (Signed)
Pt with left shoulder pain for approximately three days. Sharp and ongoing. Rates 8/10. This is at tumor site. Normal pain when pain is present.

## 2020-10-15 NOTE — Unmapped (Signed)
[facility] Refill Coordination Note    Specialty Medication(s) to be Shipped:   Hematology/Oncology: Nexavar    Other medication(s) to be shipped: No additional medications requested for fill at this time     Heidi Woodard, DOB: 1971-05-11  Phone: [phone] (home) [phone] (work)      All above HIPAA information was verified with patient.     Was a Nurse, learning disability used for this call? No    Completed refill call assessment today to schedule patient's medication shipment from the [facility] [phone]).       Specialty medication(s) and dose(s) confirmed: Regimen is correct and unchanged.   Changes to medications: [name] reports no changes at this time.  Changes to insurance: No  Questions for the pharmacist: No    Confirmed patient received Welcome Packet with first shipment. The patient will receive a drug information handout for each medication shipped and additional FDA Medication Guides as required.       DISEASE/MEDICATION-SPECIFIC INFORMATION        N/A    SPECIALTY MEDICATION ADHERENCE     Medication Adherence    Patient reported X missed doses in the last month: 0  Specialty Medication: Nexavar 200mg   Patient is on additional specialty medications: No  Informant: patient                Nexavar 200 mg: 8 days of medicine on hand       SHIPPING     Shipping address confirmed in Epic.     Delivery Scheduled: Yes, Expected medication delivery date: 10/21/20.     Medication will be delivered via UPS to the prescription address in Epic [place].    Heidi Woodard   [facility] Specialty Technician

## 2020-10-20 MED FILL — NEXAVAR 200 MG TABLET: 30 days supply | Qty: 30 | Fill #2 | Status: AC

## 2020-10-20 MED FILL — NEXAVAR 200 MG TABLET: ORAL | 30 days supply | Qty: 30 | Fill #2

## 2020-10-30 NOTE — Unmapped (Signed)
Patient needing refill of Oxycodone 5mg  PO.   Please send to [facility] pharmacy on record.     t

## 2020-10-30 NOTE — Unmapped (Signed)
Hi,    Patient Heidi Woodard called requesting a medication refill for the following:    ??? Medication: Oxycodone  ??? Dosage: 5 Mg  ??? Days left of medication: 0  ??? Pharmacy: [facility]      The expected turnaround time is 3-4 business days       Check Indicates criteria has been reviewed and confirmed with the patient:    []  Preferred Name   [x]  DOB and/or MR#  [x]  Preferred Contact Method  [x]  Phone Number(s)   [x]  Preferred Pharmacy   []  MyChart     Thank you,  [name]  [facility] Communication Center  [phone]

## 2020-11-03 NOTE — Unmapped (Signed)
Hi,    Patient Heidi Woodard contacted the Oncology Communication Center today stating that she is wanting to know if her medication oxycodone 5mg  has been faxed in. Please contact patient back once this medication has been sent in [phone].     .Thank you,   Noland Fordyce  [facility] Communication Center  [phone]

## 2020-11-04 MED ORDER — OXYCODONE 5 MG TABLET
ORAL_TABLET | ORAL | 0 refills | 0.00000 days | Status: CP
Start: 2020-11-04 — End: 2020-12-03

## 2020-11-04 NOTE — Unmapped (Signed)
Attempted to return call. Left V/m for pt that requested refill has been sent to her pharmacy. Ask her to call back with any further needs.

## 2020-11-04 NOTE — Unmapped (Signed)
Clinical Pharmacist Practitioner: Telephone Note    Reason for Call - Medication Refill    I received a message requesting refills for oxycodone.     PDMP reviewed - consistent opioid use ~100 tab every 30d for the last ~10 months. New prescription sent for oxycodone 100 tablets with 0 refills. Next refill due ~12/02/2020.     I spent 5 minutes in coordinating patient care.    Nicholos Johns, PharmD, CPP  Clinical Pharmacist Practitioner, Thoracic Oncology & Sarcoma  Pager: [phone]

## 2020-11-11 NOTE — Unmapped (Signed)
[facility] Refill Coordination Note    Specialty Medication(s) to be Shipped:   Hematology/Oncology: Nexavar    Other medication(s) to be shipped: No additional medications requested for fill at this time     Heidi Woodard, DOB: 29-Oct-1971  Phone: [phone] (home) [phone] (work)      All above HIPAA information was verified with patient.     Was a Nurse, learning disability used for this call? No    Completed refill call assessment today to schedule patient's medication shipment from the [facility] [phone]).       Specialty medication(s) and dose(s) confirmed: Regimen is correct and unchanged.   Changes to medications: Glorine reports no changes at this time.  Changes to insurance: No  Questions for the pharmacist: No    Confirmed patient received Welcome Packet with first shipment. The patient will receive a drug information handout for each medication shipped and additional FDA Medication Guides as required.       DISEASE/MEDICATION-SPECIFIC INFORMATION        N/A    SPECIALTY MEDICATION ADHERENCE     Medication Adherence    Patient reported X missed doses in the last month: 0  Specialty Medication: Nexavar 200mg   Patient is on additional specialty medications: No  Informant: patient                Nexavar 200 mg: 15 days of medicine on hand         SHIPPING     Shipping address confirmed in Epic.     Delivery Scheduled: Yes, Expected medication delivery date: 11/21/20.     Medication will be delivered via UPS to the prescription address in Epic [place].    Heidi Woodard [name]   [facility] Specialty Technician

## 2020-11-14 NOTE — Unmapped (Signed)
Hi,     Patient has contacted the Communication Center requesting that the following paperwork be completed:     Type Disability form with restriction on it need another copy from the first one.  Contact Name Hazleigh Mccleave Number [phone]  Contact Fax [phone]  Date needed back by asap      Caller has been informed that the expected turnaround is 7-10 business days.    Check Indicates criteria has been reviewed and confirmed with the patient:    [x]  Preferred Name   [x]  DOB and/or MR#  [x]  Preferred Contact Method  [x]  Phone Number(s)   []  Preferred Pharmacy (for medication refills)  []  MyChart       Thank you,   [name]  [facility] Communication Center   [phone]

## 2020-11-14 NOTE — Unmapped (Signed)
Returned call to Heidi Woodard. States she needs a letter with restrictions for disability paperwork. States she thinks Dr. Loree Fee filled on out a letter last sometime in 2019. Let her know I will forward request to the team. Will get back with her next week.

## 2020-11-20 MED FILL — NEXAVAR 200 MG TABLET: ORAL | 30 days supply | Qty: 30 | Fill #3

## 2020-11-21 MED ORDER — SIMVASTATIN 40 MG TABLET
Freq: Every evening | ORAL | 0 days
Start: 2020-11-21 — End: ?

## 2020-12-03 MED ORDER — OXYCODONE 5 MG TABLET
ORAL_TABLET | ORAL | 0 refills | 0.00000 days | Status: CP
Start: 2020-12-03 — End: ?

## 2020-12-03 NOTE — Unmapped (Signed)
Hi,    Patient Heidi Woodard called requesting a medication refill for the following:    ??? Medication: Oxycodone  ??? Dosage: 5mg   ??? Days left of medication: 0  ??? Pharmacy: [facility]        The expected turnaround time is 3-4 business days       Check Indicates criteria has been reviewed and confirmed with the patient:    [x]  Preferred Name   [x]  DOB and/or MR#  [x]  Preferred Contact Method  [x]  Phone Number(s)   [x]  Preferred Pharmacy   []  MyChart     Thank you,  [name]  [facility] Communication Center  [phone]

## 2020-12-03 NOTE — Unmapped (Signed)
Attempted to return call to pt. Left V/M that Dr. Meredith Mody refilled med as requested. He wanted to let her know to be sure and keep appt next week on 12-12-20 as he will not refill her meds again until he sees her.Ask her to call back with any further questions or concerns.

## 2020-12-08 NOTE — Unmapped (Signed)
[facility] Refill Coordination Note    Specialty Medication(s) to be Shipped:   Hematology/Oncology: Nexavar    Other medication(s) to be shipped: No additional medications requested for fill at this time     Heidi Woodard, DOB: August 12, 1971  Phone: [phone] (home) [phone] (work)      All above HIPAA information was verified with patient.     Was a Nurse, learning disability used for this call? No    Completed refill call assessment today to schedule patient's medication shipment from the [facility] [phone]).       Specialty medication(s) and dose(s) confirmed: Regimen is correct and unchanged.   Changes to medications: Heidi Woodard reports no changes at this time.  Changes to insurance: No  Questions for the pharmacist: No    Confirmed patient received Welcome Packet with first shipment. The patient will receive a drug information handout for each medication shipped and additional FDA Medication Guides as required.       DISEASE/MEDICATION-SPECIFIC INFORMATION        N/A    SPECIALTY MEDICATION ADHERENCE     Medication Adherence    Patient reported X missed doses in the last month: 0  Specialty Medication: Nexavar 200mg   Patient is on additional specialty medications: No  Informant: patient                Nexavar 200 mg: 14 days of medicine on hand         SHIPPING     Shipping address confirmed in Epic.     Delivery Scheduled: Yes, Expected medication delivery date: 12/19/20.     Medication will be delivered via UPS to the prescription address in Epic [place].    [name]   [facility] Specialty Technician

## 2020-12-11 NOTE — Unmapped (Signed)
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 Continue prn 1% hydrocort cream to face(OTC), triamcinolone cream to body, sunscreen or avoidance, and to call if she has recurrent issues. PRN benadryl for itching with rash/flaking skin. Emolliants after bathing (cocoa butter ok).  Cornstarch or baby powder to skin folds to minimize chafing.   - Continue urea cream on callouses on feet and also along neck.   - MRI Left shoulder, 02/08/20, similar to some decreased bulk the large fibrotic mass posterior to the left shoulder   - Pt states she has 100% compliance with sorafenib, no issues with refills or copays.    - 05/20/20: held sorafenib in anticipation of TKR on 05/26/20  - 08/04/20: MRI Left shoulder, similar size of fibrotic mass posterior left shoulder.  Pt reports increase in pain/swelling associated with tumor.  Restarted sorafenib 200 mg daily.    - 12/12/20: Continues on sorafenib 200 mg daily, doing well. Improvement in desmoid associated pain, manageable with low dose opioids. HFS basically resolved. HTN under control at home, although DBP >90 in clinic today. Nausea manageable with zofran twice weekly.    Plan f/u in 2 months + labs and scans.    -Possible future options if progression:   - doxil   - ongoing sorafenib (I dont think she would tolerate dose escalation)   - observation (sometimes these tumors burn out)   - gamma secretase inhibitor may be approved later this year    Essential HTN - likely exacerbated by sorafenib  - asked patient to get BP cuff and call if BP continues >140/90  - continues on hydrochlorothiazide/lisinopril    Desmoid tumor associated pain - improved with sorafenib  - Requiring two oxycodone 5 mg tabs daily; last filled 12/12/20.     HFS, mild   - continue urea 10-20% cream bid as needed  - continue aggressive emollient use to prevent dry skin    Birth control: Says she is not sexually active. Aware of teratogenic risk of pregnancy on sorafenib.    Dispo:  F/U in clinic in 2 months + labs and imaging, sooner if necessary    I personally reviewed the medical records, pathology and laboratory results and viewed the imaging. All questions were answered to the patient's apparent satisfaction and they voiced understanding and agreement with the plan. Pt has my card and contact information and is encouraged to reach out with any further questions.    I personally spent 30 minutes in face-to-face patient care and an additional 15 minutes of non-face-to-face care coordination and records review on the day of service.    Donzetta Sprung, MD/MPH  Assistant Professor  Bone and Soft Tissue Oncology Program  [facility]  Pager: [phone]  Nurse Navigator: [name]      REASON FOR CONSULTATION:   Heidi Woodard is a 50 y.o. female who is seen in follow up for her desmoid fibromatosis.    HISTORY OF PRESENT  ILLNESS:    In 2016, pt developed shoulder pain associated with a fall. There was bruising and swelling but she attributed it to the injury. It never improved, and by summer she noted increased swelling and decreased ROM (which is limited by pain, rather than painless weakness). 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 She underwent CT of the left shoudler on 06/25/16 which showed a 16 cm mass involing the left deltoid with likely invasion of the left trapezius, triceps, and teres minor. Possible LN involvement of axillary and supraclav LN on left were present. On 07/07/2016 she saw Dr. Darral Dash and ultrasound-guided biopsy was performed with pathology revealing low-grade spindle cell lesion favoring fibromatosis. He felt that surgical resection would be highly morbid. On 9/20/017, she saw Dr Beckey Rutter, who did not favor RT due to inherent risks associated with RT.     She has ongoing pain, some tingling along the backs of her hands. Pain is intermittent if her arm is at rest, happening about every other day but when it happens it is very intense. Per above it limits her ADLs although she says she is managing.      Interval history:   She reports that she is doing well. Her tumor pain is well controlled with the oxycodone 1-2x per day. She has minimal nausea, uses zofran maybe twice a week. Lost a little weight early on when she started sorafenib but now stabilized and eating and drinking well. No further HFS - had significant symptoms in her feet, now essentially resolved with urea cream. She is on a break from work at [employer], due to knee replacements. She had one done and is waiting to have the second one done.    REVIEW OF SYSTEMS:  A comprehensive review of 14 systems was negative except for pertinent positives noted in HPI.    PMH: none    PSH: none    FHx: no fhx of FAP or desmoid tumors     Social History     Occupational History   ??? Not on file   Tobacco Use   ??? Smoking status: Never Smoker   ??? Smokeless tobacco: Never Used   Substance and Sexual Activity   ??? Alcohol use: No   ??? Drug use: No   ??? Sexual activity: Not on file       ALLERGIES/MEDICATIONS:  Reviewed in EPIC    PHYSICAL EXAM:   BP 131/93  - Pulse 83  - Temp 36.3 ??C (97.3 ??F) (Oral)  - Resp 16  - Ht 160 cm (5' 3)  - Wt (!) 103 kg (227 lb)  - SpO2 98%  - BMI 40.21 kg/m??   ECOG 0  Gen - well appearing, in no acute distress  Eyes - conjunctivae clear, PERRL  ENT - mask in place  Lymph - no cervical or supraclavicular lymphadenopathy appreciated  Resp - clear to auscultation bilaterally, no wheezes, crackles or rales  CV - normal rate, regular rhythm, no murmur appreciated, no lower extremity edema noted  GI - abdomen soft, nontender, nondistended, no masses, bowel sounds normal  Skin - no rashes, no lesions noted  Neuro - AOx3, EOM intact, no facial droop, speech fluent and coherent, moves all extremities without asymmetry  Psych - affect normal, mood okay  MSK - palpable 6 cm mass L shoulder with mild pain to the touch, full ROM    +++++++++++++++++++++++++++++++++++++++++++++++++++++++++++  PATHOLOGY:   Pathology was personally reviewed as described in the HPI, detailed below.   07/07/2016  Surgical pathology exam  Final Diagnosis   A: Shoulder, left, biopsy   - Low grade spindle cell lesion, favor fibromatosis  - The lesional cells are positive for beta-catenin, weakly positive for smooth muscle actin and desmin, and negative for S100, supporting the interpretation of fibromatosis  - Dr. Pollie Friar concurs  Addendum:  Dr. Morrison Old dictating: The last section of the impression should read:     Multiple mildly prominent left supraclavicular and axillary lymph nodes, metastatic disease cannot be excluded.    LABS:    Reviewed in epic  Lab on 10/06/2020   Component Date Value Ref Range Status   ??? Sodium 10/06/2020 137  135 - 145 mmol/L Final   ??? Potassium 10/06/2020 3.2* 3.4 - 4.5 mmol/L Final   ??? Chloride 10/06/2020 99  98 - 107 mmol/L Final   ??? Anion Gap 10/06/2020 6  5 - 14 mmol/L Final   ??? CO2 10/06/2020 32.0* 20.0 - 31.0 mmol/L Final   ??? BUN 10/06/2020 18  9 - 23 mg/dL Final   ??? Creatinine 10/06/2020 0.88* 0.60 - 0.80 mg/dL Final   ??? BUN/Creatinine Ratio 10/06/2020 20   Final   ??? EGFR CKD-EPI Non-African American,* 10/06/2020 76  >=60 mL/min/1.66m2 Final   ??? EGFR CKD-EPI African American, Fem* 10/06/2020 88  >=60 mL/min/1.22m2 Final   ??? Glucose 10/06/2020 92  70 - 179 mg/dL Final   ??? Calcium 16/08/9603 9.5  8.7 - 10.4 mg/dL Final ??? Albumin 54/07/8118 3.7  3.4 - 5.0 g/dL Final   ??? Total Protein 10/06/2020 7.9  5.7 - 8.2 g/dL Final   ??? Total Bilirubin 10/06/2020 0.5  0.3 - 1.2 mg/dL Final   ??? AST 14/78/2956 23  <=34 U/L Final   ??? ALT 10/06/2020 15  10 - 49 U/L Final   ??? Alkaline Phosphatase 10/06/2020 88  46 - 116 U/L Final   ??? WBC 10/06/2020 7.4  4.5 - 11.0 10*9/L Final   ??? RBC 10/06/2020 4.62  4.00 - 5.20 10*12/L Final   ??? HGB 10/06/2020 12.9  12.0 - 16.0 g/dL Final   ??? HCT 21/30/8657 39.8  36.0 - 46.0 % Final   ??? MCV 10/06/2020 86.0  80.0 - 100.0 fL Final   ??? MCH 10/06/2020 27.9  26.0 - 34.0 pg Final   ??? MCHC 10/06/2020 32.5  31.0 - 37.0 g/dL Final   ??? RDW 84/69/6295 15.3* 12.0 - 15.0 % Final   ??? MPV 10/06/2020 9.1  7.0 - 10.0 fL Final   ??? Platelet 10/06/2020 328  150 - 440 10*9/L Final   ??? Variable HGB Concentration 10/06/2020 Slight* Not Present Final   ??? Neutrophils % 10/06/2020 45.6  % Final   ??? Lymphocytes % 10/06/2020 44.6  % Final   ??? Monocytes % 10/06/2020 4.7  % Final   ??? Eosinophils % 10/06/2020 3.2  % Final   ??? Basophils % 10/06/2020 0.5  % Final   ??? Absolute Neutrophils 10/06/2020 3.4  2.0 - 7.5 10*9/L Final   ??? Absolute Lymphocytes 10/06/2020 3.3  1.5 - 5.0 10*9/L Final   ??? Absolute Monocytes 10/06/2020 0.4  0.2 - 0.8 10*9/L Final   ??? Absolute Eosinophils 10/06/2020 0.2  0.0 - 0.4 10*9/L Final   ??? Absolute Basophils 10/06/2020 0.0  0.0 - 0.1 10*9/L Final   ??? Large Unstained Cells 10/06/2020 1  0 - 4 % Final   ???  Hypochromasia 10/06/2020 Slight* Not Present Final     RADIOLOGY:  See epic

## 2020-12-12 ENCOUNTER — Ambulatory Visit: Admit: 2020-12-12 | Discharge: 2020-12-13 | Payer: BLUE CROSS/BLUE SHIELD

## 2020-12-12 ENCOUNTER — Other Ambulatory Visit: Admit: 2020-12-12 | Discharge: 2020-12-13 | Payer: BLUE CROSS/BLUE SHIELD

## 2020-12-12 DIAGNOSIS — D481 Neoplasm of uncertain behavior of connective and other soft tissue: Principal | ICD-10-CM

## 2020-12-12 LAB — CBC W/ AUTO DIFF
BASOPHILS ABSOLUTE COUNT: 0 10*9/L (ref 0.0–0.1)
BASOPHILS RELATIVE PERCENT: 0.5 %
EOSINOPHILS ABSOLUTE COUNT: 0.1 10*9/L (ref 0.0–0.4)
EOSINOPHILS RELATIVE PERCENT: 1.5 %
HEMATOCRIT: 44.1 % (ref 36.0–46.0)
HEMOGLOBIN: 13.9 g/dL (ref 12.0–16.0)
LARGE UNSTAINED CELLS: 1 % (ref 0–4)
LYMPHOCYTES ABSOLUTE COUNT: 3.2 10*9/L (ref 1.5–5.0)
LYMPHOCYTES RELATIVE PERCENT: 44.9 %
MEAN CORPUSCULAR HEMOGLOBIN CONC: 31.5 g/dL (ref 31.0–37.0)
MEAN CORPUSCULAR HEMOGLOBIN: 28 pg (ref 26.0–34.0)
MEAN CORPUSCULAR VOLUME: 88.9 fL (ref 80.0–100.0)
MEAN PLATELET VOLUME: 9.3 fL (ref 7.0–10.0)
MONOCYTES ABSOLUTE COUNT: 0.3 10*9/L (ref 0.2–0.8)
MONOCYTES RELATIVE PERCENT: 4.4 %
NEUTROPHILS ABSOLUTE COUNT: 3.4 10*9/L (ref 2.0–7.5)
NEUTROPHILS RELATIVE PERCENT: 47.6 %
PLATELET COUNT: 373 10*9/L (ref 150–440)
RED BLOOD CELL COUNT: 4.96 10*12/L (ref 4.00–5.20)
RED CELL DISTRIBUTION WIDTH: 15.1 % — ABNORMAL HIGH (ref 12.0–15.0)
WBC ADJUSTED: 7.1 10*9/L (ref 4.5–11.0)

## 2020-12-12 LAB — COMPREHENSIVE METABOLIC PANEL
ALBUMIN: 3.8 g/dL (ref 3.4–5.0)
ALKALINE PHOSPHATASE: 96 U/L (ref 46–116)
ALT (SGPT): 21 U/L (ref 10–49)
ANION GAP: 6 mmol/L (ref 5–14)
AST (SGOT): 30 U/L (ref <=34)
BILIRUBIN TOTAL: 0.5 mg/dL (ref 0.3–1.2)
BLOOD UREA NITROGEN: 12 mg/dL (ref 9–23)
BUN / CREAT RATIO: 14
CALCIUM: 9.4 mg/dL (ref 8.7–10.4)
CHLORIDE: 98 mmol/L (ref 98–107)
CO2: 33 mmol/L — ABNORMAL HIGH (ref 20.0–31.0)
CREATININE: 0.83 mg/dL — ABNORMAL HIGH
EGFR CKD-EPI AA FEMALE: >90 mL/min/{1.73_m2} (ref >=60)
EGFR CKD-EPI NON-AA FEMALE: 82 mL/min/{1.73_m2} (ref >=60)
GLUCOSE RANDOM: 99 mg/dL (ref 70–179)
POTASSIUM: 3.6 mmol/L (ref 3.4–4.5)
PROTEIN TOTAL: 8.1 g/dL (ref 5.7–8.2)
SODIUM: 137 mmol/L (ref 135–145)

## 2020-12-12 LAB — SLIDE REVIEW

## 2020-12-12 NOTE — Unmapped (Signed)
Patient Heidi Woodard was called in attempt number one to reach them regarding their upcoming appointments on 02/13/21.     The call resulted in: Phone disconnected    Please inform the patient upon their return call of the following:     MRI scheduled for 8am    Thank you,    Estevan Oaks

## 2020-12-12 NOTE — Unmapped (Signed)
It was a pleasure to see you in clinic today.    For appointments & questions Monday through Friday 8 AM--5 PM     Please call [phone] or Toll free [phone]    For urgent clinical needs on Nights, Weekends or Holidays:  Call [phone] and ask for the oncologist on call.     For appointment changes please contact during normal business hours.     Please visit PrivacyFever.cz, a resource created just for family members and caregivers.  This website lists support services, how and where to ask for help. It has tools to assist you as you help [place] care for your loved one.    Donzetta Sprung, MD, MPH  Assistant Professor  Bone and Soft Tissue Oncology Program  Nurse Navigator: [name]    [facility]  [address]  [place], [place] [address]  [email or web address]

## 2020-12-12 NOTE — Unmapped (Unsigned)
Labs drawn peripherally by Kennyth Lose.

## 2020-12-18 MED FILL — NEXAVAR 200 MG TABLET: ORAL | 30 days supply | Qty: 30 | Fill #4

## 2020-12-29 MED ORDER — OXYCODONE 5 MG TABLET
ORAL_TABLET | 0 refills | 0 days | Status: CP
Start: 2020-12-29 — End: ?

## 2020-12-29 NOTE — Unmapped (Signed)
Hi,    Patient Heidi Woodard called requesting a medication refill for the following:    ??? Medication: Oxycodone  ??? Dosage: 5 Mg  ??? Days left of medication: 0  ??? Pharmacy: [facility]    Program: GI  Speciality: Medical Oncology     The expected turnaround time is 3-4 business days       Check Indicates criteria has been reviewed and confirmed with the patient:    []  Preferred Name   [x]  DOB and/or MR#  [x]  Preferred Contact Method  [x]  Phone Number(s)   [x]  Preferred Pharmacy   []  MyChart     Thank you,  [name]  [facility] Communication Center  [phone]

## 2020-12-31 MED ORDER — OXYCODONE 5 MG TABLET
ORAL_TABLET | 0 refills | 0 days | Status: CP
Start: 2020-12-31 — End: ?

## 2020-12-31 NOTE — Unmapped (Signed)
Hi,     Heidi Woodard contacted the Communication Center regarding the following:    - States [facility] pharmacy has informed her that the Roxicodone prescription has a start date of 01/26/21. Heidi Woodard states the date should be 12/29/20.     Please contact Heidi Woodard at [phone].    Thanks in advance,    Drema Balzarine  [facility] Communication Center   [phone]

## 2021-01-01 NOTE — Unmapped (Signed)
Returned call to pt. Let her know new script has been sent and she should be able to pick up today.She will call back with any further needs.

## 2021-01-05 NOTE — Unmapped (Signed)
[facility] Refill Coordination Note    Specialty Medication(s) to be Shipped:   Hematology/Oncology: Nexavar    Other medication(s) to be shipped: No additional medications requested for fill at this time     Heidi Woodard, DOB: Feb 09, 1971  Phone: [phone] (home) [phone] (work)      All above HIPAA information was verified with patient.     Was a Nurse, learning disability used for this call? No    Completed refill call assessment today to schedule patient's medication shipment from the [facility] [phone]).       Specialty medication(s) and dose(s) confirmed: Regimen is correct and unchanged.   Changes to medications: Cleda reports no changes at this time.  Changes to insurance: No  Questions for the pharmacist: No    Confirmed patient received Welcome Packet with first shipment. The patient will receive a drug information handout for each medication shipped and additional FDA Medication Guides as required.       DISEASE/MEDICATION-SPECIFIC INFORMATION        N/A    SPECIALTY MEDICATION ADHERENCE     Medication Adherence    Patient reported X missed doses in the last month: 0  Specialty Medication: Nexavar 200mg   Patient is on additional specialty medications: No  Informant: patient                Nexavar  200 mg: 18 days of medicine on hand         SHIPPING     Shipping address confirmed in Epic.     Delivery Scheduled: Yes, Expected medication delivery date: 01/20/21.     Medication will be delivered via UPS to the prescription address in Epic [place].    [name]   [facility] Specialty Technician

## 2021-01-19 MED FILL — NEXAVAR 200 MG TABLET: ORAL | 30 days supply | Qty: 30 | Fill #5

## 2021-01-22 MED ORDER — OXYCODONE 5 MG TABLET
ORAL_TABLET | 0 refills | 0 days | Status: CP
Start: 2021-01-22 — End: ?

## 2021-01-22 NOTE — Unmapped (Signed)
Hi,    Patient Heidi Woodard called requesting a medication refill for the following:    ??? Medication: Oxycodone  ??? Dosage: 5mg   ??? Days left of medication: 0  ??? Pharmacy: [facility]          Check Indicates criteria has been reviewed and confirmed with the patient:    [x]  Preferred Name   [x]  DOB and/or MR#  [x]  Preferred Contact Method  [x]  Phone Number(s)   [x]  Preferred Pharmacy   []  MyChart     Thank you,  [name]  [facility] Cancer Communication Center  [phone]

## 2021-01-23 NOTE — Unmapped (Signed)
Returned call to pt.Let her know script for oxycodone was sent in.She will call back with any further needs.

## 2021-02-04 NOTE — Unmapped (Signed)
Pt still has a month of medication

## 2021-02-05 MED ORDER — OXYCODONE 5 MG TABLET
ORAL_TABLET | 0 refills | 0 days | Status: CP
Start: 2021-02-05 — End: ?

## 2021-02-05 NOTE — Unmapped (Signed)
Left message for pt that refill was sent to her pharmacy as requested.Ask her to call back with any further needs.

## 2021-02-05 NOTE — Unmapped (Signed)
Hi,    Patient Heidi Woodard called requesting a medication refill for the following:    ??? Medication: Roxicodone  ??? Dosage: 5mg   ??? Days left of medication: 0  ??? Pharmacy: [facility]      The expected turnaround time is 3-4 business days       Check Indicates criteria has been reviewed and confirmed with the patient:    [x]  Preferred Name   [x]  DOB and/or MR#  [x]  Preferred Contact Method  [x]  Phone Number(s)   [x]  Preferred Pharmacy   []  MyChart     Thank you,  [name]  [facility] Cancer Communication Center  [phone]

## 2021-02-13 ENCOUNTER — Other Ambulatory Visit: Admit: 2021-02-13 | Discharge: 2021-02-14 | Payer: BLUE CROSS/BLUE SHIELD

## 2021-02-13 ENCOUNTER — Ambulatory Visit: Admit: 2021-02-13 | Discharge: 2021-02-14 | Payer: BLUE CROSS/BLUE SHIELD

## 2021-02-13 DIAGNOSIS — D481 Neoplasm of uncertain behavior of connective and other soft tissue: Principal | ICD-10-CM

## 2021-02-13 LAB — COMPREHENSIVE METABOLIC PANEL
ALBUMIN: 3.6 g/dL (ref 3.4–5.0)
ALKALINE PHOSPHATASE: 82 U/L (ref 46–116)
ALT (SGPT): 20 U/L (ref 10–49)
ANION GAP: 6 mmol/L (ref 5–14)
AST (SGOT): 29 U/L (ref <=34)
BILIRUBIN TOTAL: 0.5 mg/dL (ref 0.3–1.2)
BLOOD UREA NITROGEN: 17 mg/dL (ref 9–23)
BUN / CREAT RATIO: 22
CALCIUM: 9.4 mg/dL (ref 8.7–10.4)
CHLORIDE: 101 mmol/L (ref 98–107)
CO2: 32 mmol/L — ABNORMAL HIGH (ref 20.0–31.0)
CREATININE: 0.76 mg/dL
EGFR CKD-EPI AA FEMALE: >90 mL/min/{1.73_m2} (ref >=60)
EGFR CKD-EPI NON-AA FEMALE: >90 mL/min/{1.73_m2} (ref >=60)
GLUCOSE RANDOM: 88 mg/dL (ref 70–179)
POTASSIUM: 3.6 mmol/L (ref 3.4–4.8)
PROTEIN TOTAL: 7.7 g/dL (ref 5.7–8.2)
SODIUM: 139 mmol/L (ref 135–145)

## 2021-02-13 LAB — CBC W/ AUTO DIFF
BASOPHILS ABSOLUTE COUNT: 0 10*9/L (ref 0.0–0.1)
BASOPHILS RELATIVE PERCENT: 0.5 %
EOSINOPHILS ABSOLUTE COUNT: 0.2 10*9/L (ref 0.0–0.5)
EOSINOPHILS RELATIVE PERCENT: 2.3 %
HEMATOCRIT: 38.8 % (ref 34.0–44.0)
HEMOGLOBIN: 13 g/dL (ref 11.3–14.9)
LYMPHOCYTES ABSOLUTE COUNT: 2.9 10*9/L (ref 1.1–3.6)
LYMPHOCYTES RELATIVE PERCENT: 42.9 %
MEAN CORPUSCULAR HEMOGLOBIN CONC: 33.6 g/dL (ref 32.0–36.0)
MEAN CORPUSCULAR HEMOGLOBIN: 27.9 pg (ref 25.9–32.4)
MEAN CORPUSCULAR VOLUME: 83 fL (ref 77.6–95.7)
MEAN PLATELET VOLUME: 8.5 fL (ref 6.8–10.7)
MONOCYTES ABSOLUTE COUNT: 0.4 10*9/L (ref 0.3–0.8)
MONOCYTES RELATIVE PERCENT: 5.5 %
NEUTROPHILS ABSOLUTE COUNT: 3.3 10*9/L (ref 1.8–7.8)
NEUTROPHILS RELATIVE PERCENT: 48.8 %
PLATELET COUNT: 265 10*9/L (ref 150–450)
RED BLOOD CELL COUNT: 4.67 10*12/L (ref 3.95–5.13)
RED CELL DISTRIBUTION WIDTH: 15.2 % (ref 12.2–15.2)
WBC ADJUSTED: 6.7 10*9/L (ref 3.6–11.2)

## 2021-02-13 MED ADMIN — gadobenate dimeglumine (MULTIHANCE) 529 mg/mL (0.1mmol/0.2mL) solution 10 mL: 10 mL | INTRAVENOUS | @ 13:00:00 | Stop: 2021-02-13

## 2021-02-13 NOTE — Unmapped (Signed)
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 Continue prn 1% hydrocort cream to face(OTC), triamcinolone cream to body, sunscreen or avoidance, and to call if she has recurrent issues. PRN benadryl for itching with rash/flaking skin. Emolliants after bathing (cocoa butter ok).  Cornstarch or baby powder to skin folds to minimize chafing.   - Continue urea cream on callouses on feet and also along neck.   - MRI Left shoulder, 02/08/20, similar to some decreased bulk the large fibrotic mass posterior to the left shoulder   - Pt states she has 100% compliance with sorafenib, no issues with refills or copays.    - 05/20/20: held sorafenib in anticipation of TKR on 05/26/20  - 08/04/20: MRI Left shoulder, similar size of fibrotic mass posterior left shoulder.  Pt reports increase in pain/swelling associated with tumor.  Restarted sorafenib 200 mg daily.    - 12/12/20: Continues on sorafenib 200 mg daily, doing well. Improvement in desmoid associated pain, manageable with low dose opioids. HFS, HTN, nausea under control.  -02/13/21: pain is a bit worse. MRI is stable, if not a bit better. However, given her pain, referred for cryoablation, Dr. Cathie Hoops feels it is amenable.    Plan f/u in 3 months + labs, scans in 6 mos.    -Possible future options if progression: doxil, ongoing sorafenib (dose escalation may not be well tolerated), observation, gamma secretase inhibitor may be approved later this year    Essential HTN - likely exacerbated by sorafenib  - asked patient to get BP cuff and call if BP continues >140/90  - continues on hydrochlorothiazide/lisinopril    Desmoid tumor associated pain - improved with sorafenib, although a bit worse of late  - Requiring two oxycodone 5 mg tabs daily    HFS, mild   - continue urea 10-20% cream bid as needed  - continue aggressive emollient use to prevent dry skin    Birth control: Says she is not sexually active. Aware of teratogenic risk of pregnancy on sorafenib.    Dispo:  F/U in clinic in 3-4 months + labs, sooner if necessary    I personally reviewed the medical records, pathology and laboratory results and viewed the imaging. All questions were answered to the patient's apparent satisfaction and they voiced understanding and agreement with the plan. Pt has my card and contact information and is encouraged to reach out with any further questions.    Donzetta Sprung, MD/MPH  Assistant Professor  Bone and Soft Tissue Oncology Program  [facility]  Pager: [phone]  Nurse Navigator: [name]      REASON FOR CONSULTATION:   Heidi Woodard is a 50 y.o. female who is seen in follow up for her desmoid fibromatosis.    HISTORY OF PRESENT ILLNESS:    In 2016, pt developed shoulder pain associated with a fall. There was bruising and swelling but she attributed it to the injury. It never improved, and by summer  she noted increased swelling and decreased ROM (which is limited by pain, rather than painless weakness). 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 She underwent CT of the left shoudler on 06/25/16 which showed a 16 cm mass involing the left deltoid with likely invasion of the left trapezius, triceps, and teres minor. Possible LN involvement of axillary and supraclav LN on left were present. On 07/07/2016 she saw Dr. Darral Dash and ultrasound-guided biopsy was performed with pathology revealing low-grade spindle cell lesion favoring fibromatosis. He felt that surgical resection would be highly morbid. On 9/20/017, she saw Dr Beckey Rutter, who did not favor RT due to inherent risks associated with RT.     She has ongoing pain, some tingling along the backs of her hands. Pain is intermittent if her arm is at rest, happening about every other day but when it happens it is very intense. Per above it limits her ADLs although she says she is managing.      Interval history:     Pain is a bit worse over recent weeks/months. ROM is stable. Nausea is minimal with sorafenib, some mild HFS but generally manageable with urea cream and emollients. No other major concerns today.    REVIEW OF SYSTEMS:  A comprehensive review of 14 systems was negative except for pertinent positives noted in HPI.    PMH: none    PSH: none    FHx: no fhx of FAP or desmoid tumors     Social History     Occupational History   ??? Not on file   Tobacco Use   ??? Smoking status: Never Smoker   ??? Smokeless tobacco: Never Used   Substance and Sexual Activity   ??? Alcohol use: No   ??? Drug use: No   ??? Sexual activity: Not on file       ALLERGIES/MEDICATIONS:  Reviewed in EPIC    PHYSICAL EXAM:   BP 126/86  - Pulse 73  - Temp 35.8 ??C (96.5 ??F) (Temporal)  - Resp 16  - SpO2 100%   ECOG 0  Gen - well appearing, in no acute distress  Eyes - conjunctivae clear, PERRL  ENT - mask in place  Lymph - no cervical or supraclavicular lymphadenopathy appreciated  Resp - clear to auscultation bilaterally, no wheezes, crackles or rales  CV - normal rate, regular rhythm, no murmur appreciated, no lower extremity edema noted  GI - abdomen soft, nontender, nondistended, no masses, bowel sounds normal  Skin - no rashes, no lesions noted  Neuro - AOx3, EOM intact, no facial droop, speech fluent and coherent, moves all extremities without asymmetry  Psych - affect normal, mood okay  MSK - palpable 6 cm mass L shoulder with mild pain to the touch, slight limitation in ROM laterally    +++++++++++++++++++++++++++++++++++++++++++++++++++++++++++     PATHOLOGY:   Pathology was personally reviewed as described in the HPI, detailed below.   07/07/2016  Surgical pathology exam  Final Diagnosis   A: Shoulder, left, biopsy   - Low grade spindle cell lesion, favor fibromatosis  - The lesional cells are positive for beta-catenin, weakly positive for smooth muscle actin and desmin, and negative for S100, supporting the interpretation of fibromatosis  - Dr. Pollie Friar concurs  Addendum:  Dr. Morrison Old dictating: The last section of the impression should read:     Multiple mildly prominent left supraclavicular and axillary lymph nodes, metastatic disease cannot  be excluded.    LABS:    Reviewed in epic  Lab on 10/06/2020   Component Date Value Ref Range Status   ??? Sodium 10/06/2020 137  135 - 145 mmol/L Final   ??? Potassium 10/06/2020 3.2* 3.4 - 4.5 mmol/L Final   ??? Chloride 10/06/2020 99  98 - 107 mmol/L Final   ??? Anion Gap 10/06/2020 6  5 - 14 mmol/L Final   ??? CO2 10/06/2020 32.0* 20.0 - 31.0 mmol/L Final   ??? BUN 10/06/2020 18  9 - 23 mg/dL Final   ??? Creatinine 10/06/2020 0.88* 0.60 - 0.80 mg/dL Final   ??? BUN/Creatinine Ratio 10/06/2020 20   Final   ??? EGFR CKD-EPI Non-African American,* 10/06/2020 76  >=60 mL/min/1.47m2 Final   ??? EGFR CKD-EPI African American, Fem* 10/06/2020 88  >=60 mL/min/1.40m2 Final   ??? Glucose 10/06/2020 92  70 - 179 mg/dL Final   ??? Calcium 62/13/0865 9.5  8.7 - 10.4 mg/dL Final   ??? Albumin 78/46/9629 3.7  3.4 - 5.0 g/dL Final   ??? Total Protein 10/06/2020 7.9  5.7 - 8.2 g/dL Final   ??? Total Bilirubin 10/06/2020 0.5  0.3 - 1.2 mg/dL Final   ??? AST 52/84/1324 23  <=34 U/L Final   ??? ALT 10/06/2020 15  10 - 49 U/L Final   ??? Alkaline Phosphatase 10/06/2020 88  46 - 116 U/L Final   ??? WBC 10/06/2020 7.4  4.5 - 11.0 10*9/L Final   ??? RBC 10/06/2020 4.62  4.00 - 5.20 10*12/L Final   ??? HGB 10/06/2020 12.9  12.0 - 16.0 g/dL Final   ??? HCT 40/08/2724 39.8  36.0 - 46.0 % Final   ??? MCV 10/06/2020 86.0  80.0 - 100.0 fL Final   ??? MCH 10/06/2020 27.9  26.0 - 34.0 pg Final   ??? MCHC 10/06/2020 32.5  31.0 - 37.0 g/dL Final   ??? RDW 36/64/4034 15.3* 12.0 - 15.0 % Final   ??? MPV 10/06/2020 9.1  7.0 - 10.0 fL Final   ??? Platelet 10/06/2020 328  150 - 440 10*9/L Final   ??? Variable HGB Concentration 10/06/2020 Slight* Not Present Final   ??? Neutrophils % 10/06/2020 45.6  % Final   ??? Lymphocytes % 10/06/2020 44.6  % Final   ??? Monocytes % 10/06/2020 4.7  % Final   ??? Eosinophils % 10/06/2020 3.2  % Final   ??? Basophils % 10/06/2020 0.5  % Final   ??? Absolute Neutrophils 10/06/2020 3.4  2.0 - 7.5 10*9/L Final   ??? Absolute Lymphocytes 10/06/2020 3.3  1.5 - 5.0 10*9/L Final   ??? Absolute Monocytes 10/06/2020 0.4  0.2 - 0.8 10*9/L Final   ??? Absolute Eosinophils 10/06/2020 0.2  0.0 - 0.4 10*9/L Final   ??? Absolute Basophils 10/06/2020 0.0  0.0 - 0.1 10*9/L Final   ??? Large Unstained Cells 10/06/2020 1  0 - 4 % Final   ??? Hypochromasia 10/06/2020 Slight* Not Present Final     RADIOLOGY:  See epic

## 2021-02-13 NOTE — Unmapped (Unsigned)
Labs drawn peripherally by jenny M.

## 2021-02-16 MED ORDER — OXYCODONE 5 MG TABLET
ORAL_TABLET | 0 refills | 0 days | Status: CP
Start: 2021-02-16 — End: ?

## 2021-02-16 NOTE — Unmapped (Signed)
Hi,    Patient Heidi Woodard called requesting a medication refill for the following:    ??? Medication: Oxycodone  ??? Dosage: 5mg   ??? Days left of medication: 4  ??? Pharmacy: [facility]        Check Indicates criteria has been reviewed and confirmed with the patient:    [x]  Preferred Name   [x]  DOB and/or MR#  [x]  Preferred Contact Method  [x]  Phone Number(s)   [x]  Preferred Pharmacy   []  MyChart     Thank you,  [name]  [facility]  [phone]

## 2021-02-17 NOTE — Unmapped (Signed)
Called and V/M for pt. Let her know Dr. Brynda Rim team has spoken with [facility] and they have the script. You can pick it up tomorrow.Ask her to call back with any further questions or concerns.

## 2021-02-17 NOTE — Unmapped (Signed)
Hi,    Ms. Brownfield called back regarding oxycodone. [facility] informed patient they didn't receive the prescription that was e-scribed 02/16/21.    Thank you,   Kelli Hope  [facility]  [phone]

## 2021-02-17 NOTE — Unmapped (Signed)
It was a pleasure to see you in clinic today.    For appointments & questions Monday through Friday 8 AM--5 PM     Please call [phone] or Toll free [phone]    For urgent clinical needs on Nights, Weekends or Holidays:  Call [phone] and ask for the oncologist on call.     For appointment changes please contact during normal business hours.     Please visit PrivacyFever.cz, a resource created just for family members and caregivers.  This website lists support services, how and where to ask for help. It has tools to assist you as you help [place] care for your loved one.    Donzetta Sprung, MD, MPH  Assistant Professor  Bone and Soft Tissue Oncology Program  Nurse Navigator: [name]    [facility]  [address]  [place], [place] [address]  [email or web address]

## 2021-02-17 NOTE — Unmapped (Signed)
Returned call to pt. Let her know refill was sent to her pharmacy as requested. She will call back with any further needs.

## 2021-02-20 NOTE — Unmapped (Signed)
[facility] Clinical Assessment & Refill Coordination Note    Heidi Woodard, DOB: 1971-03-19  Phone: [phone] (home) [phone] (work)    All above HIPAA information was verified with patient.     Was a Nurse, learning disability used for this call? No    Specialty Medication(s):   Hematology/Oncology: Nexavar     Current Outpatient Medications   Medication Sig Dispense Refill   ??? celecoxib (CELEBREX) 200 MG capsule Take 200 mg by mouth daily.     ??? cyclobenzaprine (FLEXERIL) 10 MG tablet Take 10 mg by mouth daily.     ??? docusate sodium (COLACE) 100 MG capsule Take 100 mg by mouth daily.     ??? lisinopril-hydrochlorothiazide (PRINZIDE,ZESTORETIC) 20-25 mg per tablet Take 1 tablet by mouth daily.     ??? ondansetron (ZOFRAN) 8 MG tablet Take 1 tablet (8 mg total) by mouth every eight (8) hours as needed for nausea. 30 tablet 1   ??? oxyCODONE (ROXICODONE) 5 MG immediate release tablet Take 1-2 po q6 hrs prn pain 100 tablet 0   ??? potassium gluconate 2.5 mEq Tab Take 1 tablet by mouth.     ??? simvastatin (ZOCOR) 40 MG tablet Take 40 mg by mouth nightly.     ??? SORAfenib (NEXAVAR) 200 mg tablet Take 1 tablet (200 mg total) by mouth daily. 30 tablet 11   ??? therapeutic multivitamin (THERAGRAN) tablet Take 1 tablet by mouth daily.     ??? urea (CARMOL) 10 % cream APPLY NIGHTLY TO AFFECTED PRESSURE CALLOUS ON FOOT 85 g 5   ??? urea-alpha hydroxy acids 10-4 % Crea APPLY NIGHTLY TO AFFECTED PRESSURE CALLOUS ON FOOT       No current facility-administered medications for this visit.        Changes to medications: Mihira reports no changes at this time.    Allergies   Allergen Reactions   ??? Augmentin [Amoxicillin-Pot Clavulanate] Hives   ??? Penicillins Hives       Changes to allergies: No    SPECIALTY MEDICATION ADHERENCE     Nexavar 200 mg: 7 days of medicine on hand     Medication Adherence    Patient reported X missed doses in the last month: 0  Specialty Medication: nexavar 200 mg daily  Patient is on additional specialty medications: No  Informant: patient  Confirmed plan for next specialty medication refill: delivery by pharmacy          Specialty medication(s) dose(s) confirmed: Regimen is correct and unchanged.     Are there any concerns with adherence? No    Adherence counseling provided? Not needed    CLINICAL MANAGEMENT AND INTERVENTION      Clinical Benefit Assessment:    Do you feel the medicine is effective or helping your condition? Yes    Clinical Benefit counseling provided? Not needed    Adverse Effects Assessment:    Are you experiencing any side effects? No    Are you experiencing difficulty administering your medicine? 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 For example, brushing your teeth or getting up in the morning. 0    Have you discussed this with your provider? Not needed    Acute Infection Status:    Acute infections noted within Epic:  No active infections  Patient reported infection: None    Therapy Appropriateness:    Is  therapy appropriate? Yes, therapy is appropriate and should be continued    DISEASE/MEDICATION-SPECIFIC INFORMATION      N/A    PATIENT SPECIFIC NEEDS     - Does the patient have any physical, cognitive, or cultural barriers? No    - Is the patient high risk? Yes, patient is taking oral chemotherapy. Appropriateness of therapy as been assessed    - Does the patient require a Care Management Plan? No     - Does the patient require physician intervention or other additional services (i.e. nutrition, smoking cessation, social work)? No      SHIPPING     Specialty Medication(s) to be Shipped:   Hematology/Oncology: Nexavar    Other medication(s) to be shipped: No additional medications requested for fill at this time     Changes to insurance: No    Delivery Scheduled: Yes, Expected medication delivery date: 02/25/21.     Medication will be delivered via UPS to the confirmed prescription address in [facility].    The patient will receive a drug information handout for each medication shipped and additional FDA Medication Guides as required.  Verified that patient has previously received a Conservation officer, historic buildings and a Surveyor, mining.    All of the patient's questions and concerns have been addressed.    Breck Coons [facility] Specialty Pharmacist

## 2021-02-24 DIAGNOSIS — D481 Neoplasm of uncertain behavior of connective and other soft tissue: Principal | ICD-10-CM

## 2021-02-24 MED ORDER — SORAFENIB 200 MG TABLET
ORAL_TABLET | Freq: Every day | ORAL | 11 refills | 30 days | Status: CP
Start: 2021-02-24 — End: 2022-02-24
  Filled 2021-02-25: qty 30, 30d supply, fill #0

## 2021-02-27 NOTE — Unmapped (Signed)
Hi,    Patient MELODI HAPPEL called requesting a medication refill for the following:    ??? Medication: Roxicodone  ??? Dosage: 5 MG  ??? Days left of medication: 0  ??? Pharmacy: [facility]      The expected turnaround time is 3-4 business days       Check Indicates criteria has been reviewed and confirmed with the patient:    [x]  Preferred Name   [x]  DOB and/or MR#  [x]  Preferred Contact Method  [x]  Phone Number(s)   [x]  Preferred Pharmacy   []  MyChart     Thank you,  Drema Balzarine  [facility] Communication [facility]  [phone]

## 2021-03-02 NOTE — Unmapped (Signed)
Hi,    Patient Heidi Woodard called requesting a medication refill for the following:    ??? Medication: Oxycodone  ??? Dosage: 5mg   ??? Days left of medication: 5  ??? [facility]acy: [facility], [place]    The expected turnaround time is 3-4 business days     Thank you,  Yolanda Bonine  [facility] Communication [facility]  [phone]

## 2021-03-03 DIAGNOSIS — D481 Neoplasm of uncertain behavior of connective and other soft tissue: Principal | ICD-10-CM

## 2021-03-12 NOTE — Unmapped (Signed)
[facility] Refill Coordination Note    Specialty Medication(s) to be Shipped:   Hematology/Oncology: Nexavar    Other medication(s) to be shipped: No additional medications requested for fill at this time     Heidi Woodard, DOB: November 04, 1970  Phone: [phone] (home) [phone] (work)      All above HIPAA information was verified with patient.     Was a Nurse, learning disability used for this call? No    Completed refill call assessment today to schedule patient's medication shipment from the [facility] [phone]).  All relevant notes have been reviewed.     Specialty medication(s) and dose(s) confirmed: Regimen is correct and unchanged.   Changes to medications: Aerial reports no changes at this time.  Changes to insurance: No  New side effects reported not previously addressed with a pharmacist or physician: None reported  Questions for the pharmacist: No    Confirmed patient received a Conservation officer, historic buildings and a Surveyor, mining with first shipment. The patient will receive a drug information handout for each medication shipped and additional FDA Medication Guides as required.       DISEASE/MEDICATION-SPECIFIC INFORMATION        N/A    SPECIALTY MEDICATION ADHERENCE     Medication Adherence    Patient reported X missed doses in the last month: 0  Specialty Medication: Nexavar 200 mg  Patient is on additional specialty medications: No  Informant: patient              Were doses missed due to medication being on hold? No    Nexavar 200 mg: 16 days of medicine on hand       REFERRAL TO PHARMACIST     Referral to the pharmacist: Not needed      [facility]     Shipping address confirmed in Epic.     Delivery Scheduled: Yes, Expected medication delivery date: 03/26/21.     Medication will be delivered via UPS to the prescription address in Epic [place].    [name]   [facility] Specialty Technician

## 2021-03-16 DIAGNOSIS — D481 Neoplasm of uncertain behavior of connective and other soft tissue: Principal | ICD-10-CM

## 2021-03-16 NOTE — Unmapped (Signed)
Hi,    Patient ASUSENA SIGLEY called requesting a medication refill for the following:    ??? Medication: Oxycodone  ??? Dosage: 5 mg  ??? Days left of medication: 0  ??? [facility]: [facility], [place]    The expected turnaround time is 3-4 business days     Thank you,  Yolanda Bonine  [facility] Communication [facility]  [phone]

## 2021-03-16 NOTE — Unmapped (Signed)
Hi,    Patient Heidi Woodard called requesting a medication refill for the following:    ??? Medication: Roxicodone  ??? Dosage: 5 mg  ??? Days left of medication: 0  ??? Pharmacy: [facility]    The expected turnaround time is 3-4 business days       Check Indicates criteria has been reviewed and confirmed with the patient:    [x]  Preferred Name   [x]  DOB and/or MR#  [x]  Preferred Contact Method  [x]  Phone Number(s)   [x]  Preferred Pharmacy   []  MyChart     Thank you,  Drema Balzarine  [facility] Communication [facility]  [phone]

## 2021-03-17 MED ORDER — OXYCODONE 5 MG TABLET
ORAL_TABLET | 0 refills | 0 days
Start: 2021-03-17 — End: ?

## 2021-03-17 NOTE — Unmapped (Signed)
Called patient to let her know to come in for MD visit next week, as it seems her pain level seems to have increased in order to reassess. MD will give her a week's worth of pain meds until she can get re-assessed.

## 2021-03-17 NOTE — Unmapped (Signed)
Hi,    Patient Heidi Woodard contacted the [facility] Communication [facility] today stating that she is trying to get her medication refill on the oxycodone 5mg . I checked with Pharmacy at 4:00 but there was no medication. Could you all please help me by getting my medication called in. Thanks and you can contact me back at [phone].     .Thank you,   Noland Fordyce  [facility] Communication [facility]  [phone]

## 2021-03-18 NOTE — Unmapped (Signed)
Returned call to pt. Let her know I notified team.Dr. Meredith Mody will be sending in script.She will call back with any further needs.

## 2021-03-24 ENCOUNTER — Ambulatory Visit: Admit: 2021-03-24 | Discharge: 2021-03-25 | Payer: BLUE CROSS/BLUE SHIELD

## 2021-03-24 DIAGNOSIS — M25512 Pain in left shoulder: Principal | ICD-10-CM

## 2021-03-24 DIAGNOSIS — G8929 Other chronic pain: Principal | ICD-10-CM

## 2021-03-24 DIAGNOSIS — D481 Neoplasm of uncertain behavior of connective and other soft tissue: Principal | ICD-10-CM

## 2021-03-24 LAB — CBC W/ AUTO DIFF
BASOPHILS ABSOLUTE COUNT: 0.1 10*9/L (ref 0.0–0.1)
BASOPHILS RELATIVE PERCENT: 0.9 %
EOSINOPHILS ABSOLUTE COUNT: 0.2 10*9/L (ref 0.0–0.5)
EOSINOPHILS RELATIVE PERCENT: 2.3 %
HEMATOCRIT: 36.8 % (ref 34.0–44.0)
HEMOGLOBIN: 12.6 g/dL (ref 11.3–14.9)
LYMPHOCYTES ABSOLUTE COUNT: 3.4 10*9/L (ref 1.1–3.6)
LYMPHOCYTES RELATIVE PERCENT: 43.8 %
MEAN CORPUSCULAR HEMOGLOBIN CONC: 34.1 g/dL (ref 32.0–36.0)
MEAN CORPUSCULAR HEMOGLOBIN: 28 pg (ref 25.9–32.4)
MEAN CORPUSCULAR VOLUME: 81.9 fL (ref 77.6–95.7)
MONOCYTES ABSOLUTE COUNT: 0.4 10*9/L (ref 0.3–0.8)
MONOCYTES RELATIVE PERCENT: 4.8 %
NEUTROPHILS ABSOLUTE COUNT: 3.7 10*9/L (ref 1.8–7.8)
NEUTROPHILS RELATIVE PERCENT: 48.2 %
PLATELET COUNT: >244 10*9/L (ref 150–450)
RED BLOOD CELL COUNT: 4.49 10*12/L (ref 3.95–5.13)
RED CELL DISTRIBUTION WIDTH: 14.9 % (ref 12.2–15.2)
WBC ADJUSTED: 7.8 10*9/L (ref 3.6–11.2)

## 2021-03-24 LAB — COMPREHENSIVE METABOLIC PANEL
ALBUMIN: 3.6 g/dL (ref 3.4–5.0)
ALKALINE PHOSPHATASE: 85 U/L (ref 46–116)
ALT (SGPT): 24 U/L (ref 10–49)
ANION GAP: 7 mmol/L (ref 5–14)
AST (SGOT): 34 U/L (ref <=34)
BILIRUBIN TOTAL: 0.5 mg/dL (ref 0.3–1.2)
BLOOD UREA NITROGEN: 16 mg/dL (ref 9–23)
BUN / CREAT RATIO: 21
CALCIUM: 9 mg/dL (ref 8.7–10.4)
CHLORIDE: 104 mmol/L (ref 98–107)
CO2: 29 mmol/L (ref 20.0–31.0)
CREATININE: 0.75 mg/dL
EGFR CKD-EPI (2021) FEMALE: >90 mL/min/{1.73_m2} (ref >=60)
GLUCOSE RANDOM: 85 mg/dL (ref 70–179)
POTASSIUM: 3.4 mmol/L (ref 3.4–4.8)
PROTEIN TOTAL: 7.7 g/dL (ref 5.7–8.2)
SODIUM: 140 mmol/L (ref 135–145)

## 2021-03-24 LAB — TSH: THYROID STIMULATING HORMONE: 1.29 u[IU]/mL (ref 0.550–4.780)

## 2021-03-24 LAB — SLIDE REVIEW

## 2021-03-24 MED ORDER — MORPHINE ER 15 MG TABLET,EXTENDED RELEASE
ORAL_TABLET | Freq: Every evening | ORAL | 0 refills | 30.00000 days | Status: CP
Start: 2021-03-24 — End: ?

## 2021-03-24 MED ORDER — OXYCODONE 5 MG TABLET
ORAL_TABLET | 0 refills | 0 days | Status: CP
Start: 2021-03-24 — End: ?

## 2021-03-24 NOTE — Unmapped (Signed)
Nurse performed lad draw using 23G butterfly x1 attempt to right AC. Patient tolerated procedure well.

## 2021-03-24 NOTE — Unmapped (Unsigned)
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 Continue prn 1% hydrocort cream to face (OTC), triamcinolone cream to body, sunscreen or avoidance, and to call if she has recurrent issues. PRN benadryl for itching with rash/flaking skin. Emolliants after bathing (cocoa butter ok).  Cornstarch or baby powder to skin folds to minimize chafing.   - Continue urea cream on callouses on feet and also along neck.   - MRI Left shoulder, 02/08/20, similar to some decreased bulk the large fibrotic mass posterior to the left shoulder   - Pt states she has 100% compliance with sorafenib, no issues with refills or copays.    - 05/20/20: held sorafenib in anticipation of TKR on 05/26/20  - 08/04/20: MRI Left shoulder, similar size of fibrotic mass posterior left shoulder.  Pt reports increase in pain/swelling associated with tumor.  Restarted sorafenib 200 mg daily.    - 12/12/20: Continues on sorafenib 200 mg daily, doing well. Improvement in desmoid associated pain, manageable with low dose opioids. HFS, HTN, nausea under control.  -02/13/21: pain is a bit worse. MRI is stable, if not a bit better. However, given her pain, referred for cryoablation, Dr. Cathie Hoops feels it is amenable.    -Possible future options if progression: doxil, ongoing sorafenib (dose escalation may not be well tolerated), observation, gamma secretase inhibitor may be approved later this year    Essential HTN - likely exacerbated by sorafenib  - asked patient to get BP cuff and call if BP continues >140/90  - continues on hydrochlorothiazide/lisinopril    Desmoid tumor associated pain - improved with sorafenib, although a bit worse of late    -03/24/21: Follow up visit given increase in opioid medication refills. Discussion about safe prescribing, adherence to dose as prescribed, inability to do any more early refills.    She has been taking 5 pills oxycodone daily instead of 2-3 as we previously understood. This is due to increase in pain.    Adding nighttime MS contin 15mg  given overnight is the most challenging time for her. Fill #30.    Refill oxycodone, agreed to max 3 pills daily. #90 sent today.    No early refills moving forward without clear discussion of the situation.    HFS, mild   - continue urea 10-20% cream bid as needed  - continue aggressive emollient use to prevent dry skin    Birth control: Says she is not sexually active. Aware of teratogenic risk of pregnancy on sorafenib.    Dispo:  F/U in clinic in 2 months + labs, sooner if necessary. Likely imaging 3 mos after.    I personally reviewed the medical records, pathology and laboratory results and viewed the imaging. All questions were answered to the patient's apparent satisfaction and they voiced understanding and agreement with the plan. Pt has my card and contact information and is encouraged to reach out with any further questions.    Donzetta Sprung, MD/MPH  Assistant Professor  Bone and  Soft Tissue Oncology Program  [facility]  Pager: [phone]  Nurse Navigator: [name]      REASON FOR CONSULTATION:   Heidi Woodard is a 50 y.o. female who is seen in follow up for her desmoid fibromatosis.    HISTORY OF PRESENT ILLNESS:    In 2016, pt developed shoulder pain associated with a fall. There was bruising and swelling but she attributed it to the injury. It never improved, and by summer she noted increased swelling and decreased ROM (which is limited by pain, rather than painless weakness). 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 She underwent CT of the left shoudler on 06/25/16 which showed a 16 cm mass involing the left deltoid with likely invasion of the left trapezius, triceps, and teres minor. Possible LN involvement of axillary and supraclav LN on left were present. On 07/07/2016 she saw Dr. Darral Dash and ultrasound-guided biopsy was performed with pathology revealing low-grade spindle cell lesion favoring fibromatosis. He felt that surgical resection would be highly morbid. On 9/20/017, she saw Dr Beckey Rutter, who did not favor RT due to inherent risks associated with RT.     She has ongoing pain, some tingling along the backs of her hands. Pain is intermittent if her arm is at rest, happening about every other day but when it happens it is very intense. Per above it limits her ADLs although she says she is managing.      Interval history:     Long discussion about opioid use. Pain has been gradually ramping up of late, particularly in March and April seemed to jump up, and has not improved since then.    Was taking twice a day back in Dec, Jan, now taking 5 pills daily (1 at 8am, 1 an noon, 1 at 3pm, 2 at night to try to sleep). Pain is the worst overnight, as she rolls onto that shoulder and can be awakened quickly in severe pain.    Understands that we cannot do early refills any more, she will need to communicate with [place] about changes in pain medication, cannot do it without our input.    REVIEW OF SYSTEMS:  A comprehensive review of 14 systems was negative except for pertinent positives noted in HPI.    PMH: none    PSH: none    FHx: no fhx of FAP or desmoid tumors     Social History     Occupational History   ??? Not on file   Tobacco Use   ??? Smoking status: Never Smoker   ??? Smokeless tobacco: Never Used   Substance and Sexual Activity   ??? Alcohol use: No   ??? Drug use: No   ??? Sexual activity: Not on file       ALLERGIES/MEDICATIONS:  Reviewed in EPIC    PHYSICAL EXAM:   BP 142/87  - Pulse 69  - Temp 36.3 ??C (97.4 ??F) (Temporal)  - Resp 16  - Ht 160 cm (5' 3)  - Wt (!) 105.4 kg (232 lb 6.4 oz)  - SpO2 100%  - BMI 41.17 kg/m??   ECOG 0  Gen - well appearing, in no acute distress  Eyes - conjunctivae clear, PERRL  ENT - mask in place  Lymph - no cervical or supraclavicular lymphadenopathy appreciated  Resp - clear to auscultation bilaterally, no wheezes, crackles or rales  CV - normal rate,  regular rhythm, no murmur appreciated, no lower extremity edema noted  GI - abdomen soft, nontender, nondistended, no masses, bowel sounds normal  Skin - no rashes, no lesions noted  Neuro - AOx3, EOM intact, no facial droop, speech fluent and coherent, moves all extremities without asymmetry  Psych - affect normal, mood okay  MSK - palpable 6 cm mass L shoulder with mild pain to the touch, slight limitation in ROM laterally    +++++++++++++++++++++++++++++++++++++++++++++++++     PATHOLOGY:   Pathology was personally reviewed as described in the HPI, detailed below.   07/07/2016  Surgical pathology exam  Final Diagnosis   A: Shoulder, left, biopsy   - Low grade spindle cell lesion, favor fibromatosis  - The lesional cells are positive for beta-catenin, weakly positive for smooth muscle actin and desmin, and negative for S100, supporting the interpretation of fibromatosis  - Dr. Pollie Friar concurs  Addendum:  Dr. Morrison Old dictating: The last section of the impression should read:     Multiple mildly prominent left supraclavicular and axillary lymph nodes, metastatic disease cannot be excluded.    LABS:   Reviewed in epic.    RADIOLOGY:  See epic 36.0 - 46.0 % Final   ??? MCV 10/06/2020 86.0  80.0 - 100.0 fL Final   ??? MCH 10/06/2020 27.9  26.0 - 34.0 pg Final   ??? MCHC 10/06/2020 32.5  31.0 - 37.0 g/dL Final   ??? RDW 16/08/9603 15.3* 12.0 - 15.0 % Final   ??? MPV 10/06/2020 9.1  7.0 - 10.0 fL Final   ??? Platelet 10/06/2020 328  150 - 440 10*9/L Final   ??? Variable HGB Concentration 10/06/2020 Slight* Not Present Final   ??? Neutrophils % 10/06/2020 45.6  % Final   ??? Lymphocytes % 10/06/2020 44.6  % Final   ??? Monocytes % 10/06/2020 4.7  % Final   ??? Eosinophils % 10/06/2020 3.2  % Final   ??? Basophils % 10/06/2020 0.5  % Final   ??? Absolute Neutrophils 10/06/2020 3.4  2.0 - 7.5 10*9/L Final   ??? Absolute Lymphocytes 10/06/2020 3.3  1.5 - 5.0 10*9/L Final   ??? Absolute Monocytes 10/06/2020 0.4  0.2 - 0.8 10*9/L Final   ??? Absolute Eosinophils 10/06/2020 0.2  0.0 - 0.4 10*9/L Final   ??? Absolute Basophils 10/06/2020 0.0  0.0 - 0.1 10*9/L Final   ??? Large Unstained Cells 10/06/2020 1  0 - 4 % Final   ??? Hypochromasia 10/06/2020 Slight* Not Present Final     RADIOLOGY:  See epic

## 2021-03-25 MED FILL — NEXAVAR 200 MG TABLET: ORAL | 30 days supply | Qty: 30 | Fill #1

## 2021-03-31 MED ORDER — ONDANSETRON HCL 8 MG TABLET
ORAL_TABLET | Freq: Three times a day (TID) | ORAL | 0 refills | 0.00000 days | Status: CP | PRN
Start: 2021-03-31 — End: ?

## 2021-03-31 NOTE — Unmapped (Signed)
if

## 2021-04-07 NOTE — Unmapped (Signed)
Returned call to pt. States she has not been able to pick up morphine as medicare has not approved it.This nurse spoke with [facility] and states a PA needs done. Sent request for PA to MAP team.Pt states she has been having to take oxycodone and is almost out due to not having morphine.She is requesting a refill of oxycodone.Let her know I will relay the request to her team and let her know. Will call back with any further questions or concerns.

## 2021-04-07 NOTE — Unmapped (Signed)
Hi,     Janele Lague contacted the Communication Center requesting to speak with the care team of ARCADIA GORGAS to discuss:    Medicare has not approved morphine, patient has not had access to medication. As she does not have access to morphine, asking if she can get a refill of oxycodone.    Please contact [name] at [phone].    Thank you,   Kelli Hope  [facility] Communication Center   [phone]

## 2021-04-08 DIAGNOSIS — G8929 Other chronic pain: Principal | ICD-10-CM

## 2021-04-08 MED ORDER — OXYCODONE 5 MG TABLET
ORAL_TABLET | 0 refills | 0 days | Status: CP
Start: 2021-04-08 — End: ?

## 2021-04-08 NOTE — Unmapped (Signed)
Called pt to let her know script has been sent in for oxycodone. 90 tabs were sent and she will need to use them as discussed with Dr. Meredith Mody. He will refill oxycodone before refill date.She verbalizes understanding and will call back with any further needs.

## 2021-04-13 NOTE — Unmapped (Signed)
[facility] Refill Coordination Note    Specialty Medication(s) to be Shipped:   Hematology/Oncology: Nexavar    Other medication(s) to be shipped: No additional medications requested for fill at this time     Heidi Woodard, DOB: July 19, 1971  Phone: [phone] (home) [phone] (work)      All above HIPAA information was verified with patient.     Was a Nurse, learning disability used for this call? No    Completed refill call assessment today to schedule patient's medication shipment from the [facility] [phone]).  All relevant notes have been reviewed.     Specialty medication(s) and dose(s) confirmed: Regimen is correct and unchanged.   Changes to medications: Heidi Woodard reports starting the following medications: MS CONTIN 15MG   Changes to insurance: No  New side effects reported not previously addressed with a pharmacist or physician: None reported  Questions for the pharmacist: No    Confirmed patient received a Conservation officer, historic buildings and a Surveyor, mining with first shipment. The patient will receive a drug information handout for each medication shipped and additional FDA Medication Guides as required.       DISEASE/MEDICATION-SPECIFIC INFORMATION        N/A    SPECIALTY MEDICATION ADHERENCE     Medication Adherence    Patient reported X missed doses in the last month: 0  Specialty Medication: Nexavar 200 mg  Patient is on additional specialty medications: No  Informant: patient              Were doses missed due to medication being on hold? No    Nexavar 200 mg: 19 days of medicine on hand       REFERRAL TO PHARMACIST     Referral to the pharmacist: Not needed      [facility]     Shipping address confirmed in Epic.     Delivery Scheduled: Yes, Expected medication delivery date: 04/28/21.     Medication will be delivered via UPS to the prescription address in Epic [place].    Heidi Woodard Heidi Woodard   [facility] Specialty Technician

## 2021-04-17 ENCOUNTER — Other Ambulatory Visit (HOSPITAL_COMMUNITY): Payer: Self-pay | Admitting: *Deleted

## 2021-04-17 DIAGNOSIS — Z1231 Encounter for screening mammogram for malignant neoplasm of breast: Secondary | ICD-10-CM

## 2021-04-22 DIAGNOSIS — G893 Neoplasm related pain (acute) (chronic): Principal | ICD-10-CM

## 2021-04-22 NOTE — Unmapped (Signed)
Hi,     Heidi Woodard contacted the Communication Center requesting to speak with the care team of Heidi Woodard to discuss:    Patient was informed by [facility] that prior authorization is required for her oxycodone 5 mg prescription.    Please contact [name] at [phone].    Thank you,   Kelli Hope  [facility] Communication Center   [phone]

## 2021-04-23 NOTE — Unmapped (Signed)
Called pt to let her know PA for oxycodone has been approved. Asked her to call back with any further questions or concerns.

## 2021-04-27 MED FILL — NEXAVAR 200 MG TABLET: ORAL | 30 days supply | Qty: 30 | Fill #2

## 2021-04-27 NOTE — Unmapped (Signed)
Hi,    Patient Heidi Woodard called requesting a medication refill for the following:    ??? Medication: Nexavar  ??? Dosage: 200mg   ??? Days left of medication: 0  ??? Pharmacy: [facility]      The expected turnaround time is 3-4 business days       Check Indicates criteria has been reviewed and confirmed with the patient:    [x]  Preferred Name   [x]  DOB and/or MR#  [x]  Preferred Contact Method  [x]  Phone Number(s)   [x]  Preferred Pharmacy   []  MyChart     Thank you,  [name]  [facility] Communication Center  [phone]

## 2021-04-28 ENCOUNTER — Ambulatory Visit
Admit: 2021-04-28 | Discharge: 2021-04-29 | Payer: BLUE CROSS/BLUE SHIELD | Attending: Diagnostic Radiology | Primary: Diagnostic Radiology

## 2021-04-28 DIAGNOSIS — D481 Neoplasm of uncertain behavior of connective and other soft tissue: Principal | ICD-10-CM

## 2021-04-28 NOTE — Unmapped (Signed)
[facility] VASCULAR AND INTERVENTIONAL RADIOLOGY - INITIAL VISIT AND CONSULTATION     Patient Name:  Heidi Woodard  Patient Age:  50 y.o./female  Encounter Date: 04/28/2021    REFERRING PHYSICIAN: Sheral Apley, MD  [address]  [facility]  [place],  [place] [address]  PRIMARY CARE PROVIDER: [facility]        Subjective:      Chief Complaint: No chief complaint on file.       Chief Complaint: Heidi Woodard is a 50 y.o. female seen in consultation at the request of Dr. Meredith Mody for evaluation of the left shoulder desmoid tumor for possible percutaneous cryoablation.    HPI: Heidi Woodard is a 50 year old female with a history of left shoulder desmoid tumor presents to the VIR clinic today. She has 5/10 pain on her left shoulder and it gets worse depends her position during sleep. On recent MRI (02/13/2021), the desmoid tumor in the left shoulder shows spiculated margin with extension into the surrounding muscular tissue. She is currently taking oxycodone & sorafenib.    Review of Systems: Pertinent positives and negatives are noted in the HPI above. Review of systems otherwise negative. No other complaints.     Past Medical/Surgical History: No past medical history on file.     Family History: No family history on file.     Social History:    Social History     Socioeconomic History   ??? Marital status: Single   Tobacco Use   ??? Smoking status: Never Smoker   ??? Smokeless tobacco: Never Used   Substance and Sexual Activity   ??? Alcohol use: No   ??? Drug use: No         Allergies:    Allergies   Allergen Reactions   ??? Augmentin [Amoxicillin-Pot Clavulanate] Hives   ??? Penicillins Hives        Medications:    Current Outpatient Medications   Medication Sig Dispense Refill   ??? celecoxib (CELEBREX) 200 MG capsule Take 200 mg by mouth daily.     ??? cyclobenzaprine (FLEXERIL) 10 MG tablet Take 10 mg by mouth daily.     ??? docusate sodium (COLACE) 100 MG capsule Take 100 mg by mouth daily.     ??? lisinopril-hydrochlorothiazide (PRINZIDE,ZESTORETIC) 20-25 mg per tablet Take 1 tablet by mouth daily.     ??? MORPhine (MS CONTIN) 15 MG 12 hr tablet Take 1 tablet (15 mg total) by mouth at bedtime. 30 tablet 0   ??? ondansetron (ZOFRAN) 8 MG tablet Take 1 tablet (8 mg total) by mouth every eight (8) hours as needed for nausea. 30 tablet 3   ??? oxyCODONE (ROXICODONE) 5 MG immediate release tablet Take 1 po q4 hrs prn pain (maximum of 3 pills per day) 90 tablet 0   ??? potassium gluconate 2.5 mEq Tab Take 1 tablet by mouth.     ??? simvastatin (ZOCOR) 40 MG tablet Take 40 mg by mouth nightly.     ??? SORAfenib (NEXAVAR) 200 mg tablet Take 1 tablet (200 mg total) by mouth daily. 30 tablet 11   ??? therapeutic multivitamin (THERAGRAN) tablet Take 1 tablet by mouth daily.     ??? urea (CARMOL) 10 % cream APPLY NIGHTLY TO AFFECTED PRESSURE CALLOUS ON FOOT 85 g 5   ??? urea-alpha hydroxy acids 10-4 % Crea APPLY NIGHTLY TO AFFECTED PRESSURE CALLOUS ON FOOT       No current facility-administered medications for this visit.  Objective:      Physical Exam  Vital Signs:  Vitals:    04/28/21 1329   BP: 130/84   Pulse: 79   Temp: 35.5 ??C (95.9 ??F)   SpO2: 97%     General: Healthy-appearing 50 y.o. female in no acute distress.       Pertinent Laboratory Values:   Lab Results   Component Value Date    WBC 7.8 03/24/2021    HGB 12.6 03/24/2021    HCT 36.8 03/24/2021    PLT >244 03/24/2021     Lab Results   Component Value Date    NA 140 03/24/2021    K 3.4 03/24/2021    CL 104 03/24/2021    CO2 29.0 03/24/2021    BUN 16 03/24/2021    CREATININE 0.75 03/24/2021    GLU 85 03/24/2021    CALCIUM 9.0 03/24/2021    MG 1.6 05/20/2020     Lab Results   Component Value Date    BILITOT 0.5 03/24/2021    PROT 7.7 03/24/2021    ALBUMIN 3.6 03/24/2021    ALT 24 03/24/2021    AST 34 03/24/2021    ALKPHOS 85 03/24/2021     Lab Results   Component Value Date    LABPROT 8.5 09/17/2016        Imaging Studies: Shoulder MRI, 02/13/2021  TECHNIQUE: ??MRI of the left shoulder was performed before and after administration of IV contrast using a local coil. Multisequence, multiplanar images were obtained.]     FINDINGS:   Redemonstrated heterogeneous ill-defined infiltrative low signal mass overlying the left posterior soft tissues. The mass measures approximately 12.5 x 5.5 x 13.3 cm (T1 axial image 15, STIR sagittal image 32), which is similar to slightly decreased in craniocaudal extent, compared to prior exams accounting for differences in positioning/technique. There is a similar degree of involvement of the underlying shoulder musculature including the deltoid, trapezius, infraspinatus, and supraspinatus. There is mild heterogeneous enhancement throughout the lesion, though this appears slightly decreased from prior.     Impression  Redemonstrated large fibrotic mass along the left posterior shoulder with slight interval decrease in craniocaudal extent and decreased internal heterogeneous enhancement, though this may be related to differences in positioning/technique and contrast timing.     Assessment:   Heidi Woodard is a 50 y.o. female with a history of desmoid tumor in the left shoulder who presents to VIR clinic with pain (5/10) for evaluation for possible percutaneous CT guided cryoablation of the left shoulder lesion.     Plan (Medical Decision Making):     At this time we recommend proceeding with percutaneous CT guided cryoablation of the left shoulder desmoid tumor.  The technical details of the procedure, benefits and risks to include injury to the surrounding tissue, infection, bleeding and potential repeated procedure were discussed in-depth with the patient. The patient verbalized understanding and was interested in proceeding. The procedure will be performed under general anesthesia. The patient will be positioned on prone and using [place] and CT, either 2 or 3 cryoprobes will be used.      A total of 25 minutes were spent with the patient discussing treatment options, the procedure in-depth and the plan of care.      Ammie Dalton, MD  Professor of Radiology  Vascular and Interventional Radiology  [place] [facility] [place] [facility]

## 2021-04-29 ENCOUNTER — Ambulatory Visit (HOSPITAL_COMMUNITY): Payer: Medicaid Other

## 2021-04-30 DIAGNOSIS — D481 Neoplasm of uncertain behavior of connective and other soft tissue: Principal | ICD-10-CM

## 2021-04-30 NOTE — Unmapped (Signed)
CBC/CMP for [facility] orders placed per MD for prior to 7/29 appt.

## 2021-05-05 MED ORDER — UREA 10 % TOPICAL CREAM
5 refills | 0 days
Start: 2021-05-05 — End: ?

## 2021-05-05 MED ORDER — OXYCODONE 5 MG TABLET
ORAL_TABLET | 0 refills | 0 days
Start: 2021-05-05 — End: ?

## 2021-05-05 NOTE — Unmapped (Signed)
Hi,    Patient Heidi Woodard called requesting a medication refill for the following:    ??? Medication: Morphine  ??? Dosage: 15mg   ??? Days left of medication: 0  ??? Pharmacy: [facility]      The expected turnaround time is 3-4 business days       Check Indicates criteria has been reviewed and confirmed with the patient:    [x]  Preferred Name   [x]  DOB and/or MR#  [x]  Preferred Contact Method  [x]  Phone Number(s)   [x]  Preferred Pharmacy   []  MyChart     Thank you,  [name]  [facility] Cancer Communication Center  [phone]

## 2021-05-05 NOTE — Unmapped (Signed)
Hi,    Patient Heidi Woodard called requesting a medication refill for the following:    ??? Medication: Carmol  ??? Dosage: 10% cream  ??? Days left of medication: 0  ??? Pharmacy: [facility]      The expected turnaround time is 3-4 business days       Check Indicates criteria has been reviewed and confirmed with the patient:    [x]  Preferred Name   [x]  DOB and/or MR#  [x]  Preferred Contact Method  [x]  Phone Number(s)   [x]  Preferred Pharmacy   []  MyChart     Thank you,  [name]  [facility] Communication Center  [phone]

## 2021-05-07 MED ORDER — MORPHINE ER 15 MG TABLET,EXTENDED RELEASE
ORAL_TABLET | Freq: Every evening | ORAL | 0 refills | 30 days | Status: CP
Start: 2021-05-07 — End: ?

## 2021-05-07 MED ORDER — UREA 10 % TOPICAL CREAM
5 refills | 0 days | Status: CP
Start: 2021-05-07 — End: ?

## 2021-05-07 NOTE — Unmapped (Signed)
Hi,    Patient Heidi Woodard called requesting a medication refill for the following:    ??? Medication: Morphine, Carmol  ??? Dosage: 15 mg, 10% cream  ??? Days left of medication: 0  ??? Pharmacy: [facility]    The expected turnaround time is 3-4 business days       Check Indicates criteria has been reviewed and confirmed with the patient:    [x]  Preferred Name   [x]  DOB and/or MR#  [x]  Preferred Contact Method  [x]  Phone Number(s)   [x]  Preferred Pharmacy   []  MyChart     Thank you,  [name]  [facility] Communication Center  [phone]

## 2021-05-07 NOTE — Unmapped (Signed)
Returned call to pt. Let her know requested refills have been sent to her pharmacy. She will call back with any further needs.

## 2021-05-11 NOTE — Unmapped (Signed)
Called pt. Left voice mail and sent e mail asking where she wanted to get her local labs done. [facility] or [facility] both look like options.

## 2021-05-14 NOTE — Unmapped (Signed)
[facility] Refill Coordination Note    Specialty Medication(s) to be Shipped:   Hematology/Oncology: Nexavar    Other medication(s) to be shipped: No additional medications requested for fill at this time     Heidi Woodard, DOB: 04/13/1971  Phone: [phone] (home) [phone] (work)      All above HIPAA information was verified with patient.     Was a Nurse, learning disability used for this call? No    Completed refill call assessment today to schedule patient's medication shipment from the [facility] [phone]).  All relevant notes have been reviewed.     Specialty medication(s) and dose(s) confirmed: Regimen is correct and unchanged.   Changes to medications: Heidi Woodard reports no changes at this time.  Changes to insurance: No  New side effects reported not previously addressed with a pharmacist or physician: None reported  Questions for the pharmacist: No    Confirmed patient received a Conservation officer, historic buildings and a Surveyor, mining with first shipment. The patient will receive a drug information handout for each medication shipped and additional FDA Medication Guides as required.       DISEASE/MEDICATION-SPECIFIC INFORMATION        N/A    SPECIALTY MEDICATION ADHERENCE     Medication Adherence    Patient reported X missed doses in the last month: 0  Specialty Medication: Nexavar 200 mg  Patient is on additional specialty medications: No  Informant: patient              Were doses missed due to medication being on hold? No    Nexavar 200 mg: 14 days of medicine on hand     REFERRAL TO PHARMACIST     Referral to the pharmacist: Not needed      [facility]     Shipping address confirmed in Epic.     Delivery Scheduled: Yes, Expected medication delivery date: 05/27/21.     Medication will be delivered via UPS to the prescription address in Epic [place].    Heidi Woodard   [facility] Specialty Technician

## 2021-05-18 NOTE — Unmapped (Signed)
Hi,    Patient Heidi Woodard called requesting a medication refill for the following:    ??? Medication: Oxycodone  ??? Dosage: 5mg   ??? Days left of medication: 0  ??? Pharmacy:  [facility] [phone]    Please call in this medication for the patient at the [facility] at [phone].      The expected turnaround time is 3-4 business days       Check Indicates criteria has been reviewed and confirmed with the patient:    [x]  Preferred Name   [x]  DOB and/or MR#  [x]  Preferred Contact Method  [x]  Phone Number(s)   [x]  Preferred Pharmacy   []  MyChart     Thank you,  Noland Fordyce  [facility] Cancer Communication Center  [phone]

## 2021-05-18 NOTE — Unmapped (Signed)
Hi,     Patient Heidi Woodard contacted the [facility] today stating that she is trying to get an surgery scheduled asap.     Please contacted the patient back at [phone] for patient to arrange getting this surgery scheduled.     .Thank you,   Noland Fordyce  [facility] Cancer Communication Center  [phone]

## 2021-05-20 MED ORDER — OXYCODONE 5 MG TABLET
ORAL_TABLET | 0 refills | 0 days | Status: CP
Start: 2021-05-20 — End: ?

## 2021-05-20 NOTE — Unmapped (Signed)
Informed patient that medication refill has been ordered. Asked about surgery, which patient states is to be performed by DR Cathie Hoops, but has not heard anything regarding schedule.  Patient made aware I would forward this to Dr Cathie Hoops, and will included our team in on the communication as well. Patient had no further needs to be addressed.

## 2021-05-20 NOTE — Unmapped (Signed)
Hi,    Patient Heidi Woodard called requesting a medication refill for the following:    ??? Medication: Oxycodone  ??? Dosage: 5mg   ??? Days left of medication: 0  ??? Pharmacy: [facility]          Check Indicates criteria has been reviewed and confirmed with the patient:    [x]  Preferred Name   [x]  DOB and/or MR#  [x]  Preferred Contact Method  [x]  Phone Number(s)   [x]  Preferred Pharmacy   []  MyChart     Thank you,  [name]  [facility] Cancer Communication Center  [phone]

## 2021-05-26 MED FILL — NEXAVAR 200 MG TABLET: ORAL | 30 days supply | Qty: 30 | Fill #3

## 2021-05-27 NOTE — Unmapped (Signed)
Called patient to remind her to get labs done prior to Friday's appointment. Confirmed she prefers [facility] and confirm [facility] orders are in place.

## 2021-05-29 ENCOUNTER — Telehealth: Admit: 2021-05-29 | Discharge: 2021-05-30 | Payer: BLUE CROSS/BLUE SHIELD

## 2021-05-29 DIAGNOSIS — G8929 Other chronic pain: Principal | ICD-10-CM

## 2021-05-29 DIAGNOSIS — M25512 Pain in left shoulder: Principal | ICD-10-CM

## 2021-05-29 DIAGNOSIS — D481 Neoplasm of uncertain behavior of connective and other soft tissue: Principal | ICD-10-CM

## 2021-05-29 DIAGNOSIS — L98491 Non-pressure chronic ulcer of skin of other sites limited to breakdown of skin: Principal | ICD-10-CM

## 2021-05-29 NOTE — Unmapped (Signed)
Telehealth completed  Allergies, medication and pharmacy updated.

## 2021-05-29 NOTE — Unmapped (Signed)
[facility] BONE AND SOFT TISSUE ONCOLOGY RETURN VISIT    Encounter Date: 05/29/2021  Patient Name: Heidi Woodard  Medical Record Number: [id number]    Referring Physician: Sheral Apley, MD  [address]  [facility] [address] [place],  [place] [address]    Primary Care Provider: [facility]        The patient reports they are currently: at home. I spent 30 minutes on the real-time audio and video with the patient on the date of service. I spent an additional 10 minutes on pre- and post-visit activities on the date of service.     The patient was physically located in [place] or a state in which I am permitted to provide care. The patient and/or parent/guardian understood that s/he may incur co-pays and cost sharing, and agreed to the telemedicine visit. The visit was reasonable and appropriate under the circumstances given the patient's presentation at the time.    The patient and/or parent/guardian has been advised of the potential risks and limitations of this mode of treatment (including, but not limited to, the absence of in-person examination) and has agreed to be treated using telemedicine. The patient's/patient's family's questions regarding telemedicine have been answered.     If the visit was completed in an ambulatory setting, the patient and/or parent/guardian has also been advised to contact their provider???s office for worsening conditions, and seek emergency medical treatment and/or call 911 if the patient deems either necessary.    DIAGNOSIS:  1. Desmoid fibromatosis    2. Chronic left shoulder pain    3. 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 Continue prn topical steroids  - Continue urea cream on callouses on feet and also along neck.   - MRI Left shoulder, 02/08/20, similar to decreased bulk of shoulder mass  - 05/20/20: held sorafenib in anticipation of TKR on 05/26/20  - 08/04/20: MRI Left shoulder, similar size but increase in pain/swelling. Restarted sorafenib 200 mg daily.    - 12/12/20: Continues on sorafenib 200 mg daily, doing well. Improvement in desmoid associated pain, manageable with low dose opioids. HFS, HTN, nausea under control.  -02/13/21: Pain progressively worse over past few months, requiring increasing doses of opioids. MRI is stable/slightly better. However, given her pain, referred for cryoablation, Dr. Cathie Hoops feels it is amenable.  -05/29/21: Seen in follow up. Tolerating sorafenib well. Pain is stable on opioid regimen as below. Cryo scheduled in ~ 2 weeks  ??  -Possible future options if progression: doxil, ongoing sorafenib (dose escalation may not be well tolerated), observation, gamma secretase inhibitor may be approved later this year  ??  Essential HTN - likely exacerbated by sorafenib  - asked patient to get BP cuff and call if BP continues >140/90  - continues on hydrochlorothiazide/lisinopril  ??  Desmoid tumor associated pain - improved with sorafenib, although a bit worse of late. Had follow up visit in May for discussion about safe prescribing, adherence to dose as prescribed, inability to do any more early refills, as she had been requesting several early refills due to escalating pain.  ??  Added nighttime MS contin 15mg  (#30 per month)??  Oxycodone 5mg  Q4h prn, max 3 pills daily (#90 per month)  ??  No early refills moving forward without clear discussion of the situation.  ??  HFS, mild   - continue urea 10-20% cream bid as needed  - continue aggressive emollient use to prevent dry skin  ??  Birth control: Says she is not sexually active. Aware of teratogenic risk of pregnancy on sorafenib.  ??  Dispo:  F/U 3 mos w MRI shoulder    I personally reviewed the medical records, pathology and laboratory results and viewed the imaging. All questions were answered to the patient's apparent satisfaction and they voiced understanding and agreement with the plan. Pt has my card and contact information and is encouraged to reach out with any further questions.    Donzetta Sprung, MD/MPH  Assistant Professor  Bone and Soft Tissue Oncology Program  [facility]  Pager: [phone]  Nurse Navigator: [name] RN, BSN, OCN      REASON FOR CONSULTATION:   Heidi Woodard is a 50 y.o. female who is seen in consultation at the request of Sheral Apley, MD for evaluation of her desmoid fibromatosis.      HISTORY OF PRESENT ILLNESS:      In 2016, pt developed shoulder pain associated with a fall. There was bruising and swelling but she attributed it to the injury. It never improved, and by summer she noted increased swelling and decreased ROM (which is limited by pain, rather than painless weakness). 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 She underwent CT of the left shoudler on 06/25/16 which showed a 16 cm mass involing the left deltoid with likely invasion of the left trapezius, triceps, and teres minor. Possible LN involvement of axillary and supraclav LN on left were present. On 07/07/2016 she saw Dr. Darral Dash and ultrasound-guided biopsy was performed with pathology revealing low-grade spindle cell lesion favoring fibromatosis. He felt that surgical resection would be highly morbid. On 9/20/017, she saw Dr Beckey Rutter, who did not favor RT due to inherent risks associated with RT.   ??  She has ongoing pain, some tingling along the backs of her hands. Pain is intermittent if her arm is at rest, happening about every other day but when it happens it is very intense. Per above it limits her ADLs although she says she is managing.  ??  Interval history:   Doing pretty well overall. Stable pain control on the current opioid regimen. Getting ~ 7 hours of sleep nightly, pain control manageable during the day (~6 out of 10). Energy level pretty good, HFS under control, BP consistently 140 systolic or lower. No rash.    REVIEW OF SYSTEMS:  A comprehensive review of 12 systems was negative except for pertinent positives noted in HPI.    Past Medical History, Surgical History, Family History were reviewed personally. Any changes were updated as above.    Social History:  Lives in [place]  Has a supportive boyfriend  No t/e/d    Pregnancy status: perimenopausal, not sexually active      ALLERGIES/MEDICATIONS:  Reviewed in EPIC    PHYSICAL EXAM:   Gen - well appearing, in no acute distress  Eyes - conjunctivae clear, PERRL  ENT - oral mucosa clear, dentition normal  Resp - symmetric excursion, normal work of breathing  Skin - no rashes, no lesions noted on visible skin  Neuro - AOx3, EOM intact, no facial droop, speech fluent and coherent, moves all extremities  without asymmetry  Psych - affect normal, mood okay      PATHOLOGY:   Pathology was personally reviewed as described in the HPI, detailed in Epic.     LABS:  Reviewed in Epic    RADIOLOGY:  Imaging was personally viewed and interpreted as summarized in the history (see above).

## 2021-05-29 NOTE — Unmapped (Signed)
It was a pleasure to see you in clinic today.    For appointments & questions Monday through Friday 8 AM--5 PM     Please call [phone] or Toll free [phone]    For urgent clinical needs on Nights, Weekends or Holidays:  Call [phone] and ask for the oncologist on call.     For appointment changes please contact during normal business hours.     Please visit [email or web address], a resource created just for family members and caregivers.  This website lists support services, how and where to ask for help. It has tools to assist you as you help us care for your loved one.    Jamelle Noy, MD, MPH  Assistant Professor  Bone and Soft Tissue Oncology Program  Nurse Navigator: Stephanie Shea    [facility]  [address]  [place], [place] [address]  [email or web address]

## 2021-06-02 MED ORDER — MORPHINE ER 15 MG TABLET,EXTENDED RELEASE
ORAL_TABLET | Freq: Every evening | ORAL | 0 refills | 30.00000 days
Start: 2021-06-02 — End: ?

## 2021-06-02 NOTE — Unmapped (Signed)
.  Hi,    Patient Heidi Woodard called requesting a medication refill for the following:    ??? Medication: morphine  ??? Dosage: 15mg   ??? Days left of medication: 5  ??? Pharmacy[facility] [place]    Patient needs this medication refill asap.    Please contact patient back to let her know this has been sent in at [phone].     The expected turnaround time is 3-4 business days       Check Indicates criteria has been reviewed and confirmed with the patient:    [x]  Preferred Name   [x]  DOB and/or MR#  [x]  Preferred Contact Method  [x]  Phone Number(s)   [x]  Preferred Pharmacy   []  MyChart     Thank you,  Noland Fordyce  [facility] Cancer Communication Center  [phone]

## 2021-06-03 NOTE — Unmapped (Signed)
Called and left pt a V/M that her refill was sent in as requested. Asked her to call back with any further needs.

## 2021-06-04 ENCOUNTER — Other Ambulatory Visit: Payer: Self-pay

## 2021-06-04 ENCOUNTER — Ambulatory Visit (HOSPITAL_COMMUNITY)
Admission: RE | Admit: 2021-06-04 | Discharge: 2021-06-04 | Disposition: A | Discharge status: Home / Self Care | Payer: Medicaid Other | Source: Ambulatory Visit | Attending: *Deleted | Admitting: *Deleted

## 2021-06-04 DIAGNOSIS — Z1231 Encounter for screening mammogram for malignant neoplasm of breast: Secondary | ICD-10-CM | POA: Diagnosis present

## 2021-06-09 NOTE — Unmapped (Signed)
Hi,    Patient Heidi Woodard called requesting a medication refill for the following:    ??? Medication: Oxycodone  ??? Dosage: 10 mg  ??? Days left of medication: 0  ??? [facility]acy: [facility], [place]      The expected turnaround time is 3-4 business days       Thank you,  Yolanda Bonine  [facility] Cancer Communication Center  [phone]

## 2021-06-12 NOTE — Unmapped (Signed)
Hi,    Patient Heidi Woodard called requesting a medication refill for the following:    ??? Medication: Oxycodone  ??? Dosage: 5 mg  ??? Days left of medication: 0  ??? [facility] [place]    The expected turnaround time is 3-4 business days     Thank you,  Yolanda Bonine  [facility] Cancer Communication Center  [phone]

## 2021-06-12 NOTE — Unmapped (Signed)
Pre-procedure instructions completed. Travel Screen Complete.    [facility] at least 1hr prior to procedure, then check into [facility], 2nd floor: V[facility].     NPO status reviewed: no solid foods for at least eight hours prior to the procedure; may have clear liquids up to two hours prior to procedure. (no broth, no jello). Okay to take morning medication with sips of clear liquids.      Must have driver 18 years or older. They verbalized understanding of the plan. All questions answered.

## 2021-06-15 NOTE — Unmapped (Signed)
Returned call to pt. Let her know refill was sent in as requested. She can pick it up on 06-21-21.She verbalizes understanding.WIll call back with any further needs.

## 2021-06-15 NOTE — Unmapped (Signed)
[facility] Refill Coordination Note    Specialty Medication(s) to be Shipped:   Hematology/Oncology: Nexavar    Other medication(s) to be shipped: No additional medications requested for fill at this time     Heidi Woodard, DOB: Jan 26, 1971  Phone: [phone] (home) [phone] (work)      All above HIPAA information was verified with patient.     Was a Nurse, learning disability used for this call? No    Completed refill call assessment today to schedule patient's medication shipment from the [facility] [phone]).  All relevant notes have been reviewed.     Specialty medication(s) and dose(s) confirmed: Regimen is correct and unchanged.   Changes to medications: [name] reports no changes at this time.  Changes to insurance: No  New side effects reported not previously addressed with a pharmacist or physician: None reported  Questions for the pharmacist: No    Confirmed patient received a Conservation officer, historic buildings and a Surveyor, mining with first shipment. The patient will receive a drug information handout for each medication shipped and additional FDA Medication Guides as required.       DISEASE/MEDICATION-SPECIFIC INFORMATION        N/A    SPECIALTY MEDICATION ADHERENCE     Medication Adherence    Patient reported X missed doses in the last month: 0  Specialty Medication: Nexavar 200 mg  Patient is on additional specialty medications: No  Informant: patient              Were doses missed due to medication being on hold? No    Nexavar  200 mg: 14 days of medicine on hand       REFERRAL TO PHARMACIST     Referral to the pharmacist: Not needed      [facility]     Shipping address confirmed in Epic.     Delivery Scheduled: Yes, Expected medication delivery date: 06/30/21.     Medication will be delivered via UPS to the prescription address in Epic [place].    Heidi Woodard   [facility] Specialty Technician

## 2021-06-16 ENCOUNTER — Encounter
Admit: 2021-06-16 | Discharge: 2021-06-16 | Payer: BLUE CROSS/BLUE SHIELD | Attending: Diagnostic Radiology | Primary: Diagnostic Radiology

## 2021-06-16 ENCOUNTER — Encounter
Admit: 2021-06-16 | Discharge: 2021-06-16 | Payer: BLUE CROSS/BLUE SHIELD | Attending: Anesthesiology | Primary: Anesthesiology

## 2021-06-16 ENCOUNTER — Ambulatory Visit: Admit: 2021-06-16 | Discharge: 2021-06-16 | Payer: BLUE CROSS/BLUE SHIELD

## 2021-06-16 DIAGNOSIS — D481 Neoplasm of uncertain behavior of connective and other soft tissue: Principal | ICD-10-CM

## 2021-06-16 LAB — PROTIME-INR
INR: 1.09
PROTIME: 12.7 s (ref 10.3–13.4)

## 2021-06-16 LAB — CBC W/ AUTO DIFF
BASOPHILS ABSOLUTE COUNT: 0 10*9/L (ref 0.0–0.1)
BASOPHILS RELATIVE PERCENT: 0.5 %
EOSINOPHILS ABSOLUTE COUNT: 0.2 10*9/L (ref 0.0–0.5)
EOSINOPHILS RELATIVE PERCENT: 3 %
HEMATOCRIT: 37.4 % (ref 34.0–44.0)
HEMOGLOBIN: 12.6 g/dL (ref 11.3–14.9)
LYMPHOCYTES ABSOLUTE COUNT: 2.7 10*9/L (ref 1.1–3.6)
LYMPHOCYTES RELATIVE PERCENT: 41.2 %
MEAN CORPUSCULAR HEMOGLOBIN CONC: 33.7 g/dL (ref 32.0–36.0)
MEAN CORPUSCULAR HEMOGLOBIN: 28.2 pg (ref 25.9–32.4)
MEAN CORPUSCULAR VOLUME: 83.6 fL (ref 77.6–95.7)
MEAN PLATELET VOLUME: 8.3 fL (ref 6.8–10.7)
MONOCYTES ABSOLUTE COUNT: 0.4 10*9/L (ref 0.3–0.8)
MONOCYTES RELATIVE PERCENT: 5.6 %
NEUTROPHILS ABSOLUTE COUNT: 3.2 10*9/L (ref 1.8–7.8)
NEUTROPHILS RELATIVE PERCENT: 49.7 %
PLATELET COUNT: 255 10*9/L (ref 150–450)
RED BLOOD CELL COUNT: 4.48 10*12/L (ref 3.95–5.13)
RED CELL DISTRIBUTION WIDTH: 14.7 % (ref 12.2–15.2)
WBC ADJUSTED: 6.4 10*9/L (ref 3.6–11.2)

## 2021-06-16 MED ORDER — OXYCODONE 5 MG TABLET
ORAL_TABLET | Freq: Once | ORAL | 0 refills | 5.00000 days | Status: CP | PRN
Start: 2021-06-16 — End: 2021-06-21

## 2021-06-16 MED ADMIN — phenylephrine 0.8 mg/10 mL (80 mcg/mL) injection: INTRAVENOUS | @ 16:00:00 | Stop: 2021-06-16

## 2021-06-16 MED ADMIN — phenylephrine 20 mg in sodium chloride 0.9% 250 mL (80 mcg/mL) infusion PMB: INTRAVENOUS | @ 16:00:00 | Stop: 2021-06-16

## 2021-06-16 MED ADMIN — levoFLOXacin (LEVAQUIN) 500 mg/100 mL IVPB 500 mg: 500 mg | INTRAVENOUS | @ 16:00:00 | Stop: 2021-06-16

## 2021-06-16 MED ADMIN — ondansetron (ZOFRAN) injection 4 mg: 4 mg | INTRAVENOUS | @ 19:00:00 | Stop: 2021-06-16

## 2021-06-16 MED ADMIN — fentaNYL (PF) (SUBLIMAZE) injection: INTRAVENOUS | @ 17:00:00 | Stop: 2021-06-16

## 2021-06-16 MED ADMIN — ROCuronium (ZEMURON) injection: INTRAVENOUS | @ 17:00:00 | Stop: 2021-06-16

## 2021-06-16 MED ADMIN — phenylephrine 0.8 mg/10 mL (80 mcg/mL) injection: INTRAVENOUS | @ 18:00:00 | Stop: 2021-06-16

## 2021-06-16 MED ADMIN — fentaNYL (PF) (SUBLIMAZE) injection 25 mcg: 25 ug | INTRAVENOUS | @ 19:00:00 | Stop: 2021-06-16

## 2021-06-16 MED ADMIN — pregabalin (LYRICA) capsule 100 mg: 100 mg | ORAL | @ 15:00:00 | Stop: 2021-06-16

## 2021-06-16 MED ADMIN — ondansetron (ZOFRAN) injection: INTRAVENOUS | @ 18:00:00 | Stop: 2021-06-16

## 2021-06-16 MED ADMIN — sugammadex (BRIDION) injection: INTRAVENOUS | @ 18:00:00 | Stop: 2021-06-16

## 2021-06-16 MED ADMIN — fentaNYL (PF) (SUBLIMAZE) injection: INTRAVENOUS | @ 16:00:00 | Stop: 2021-06-16

## 2021-06-16 MED ADMIN — acetaminophen (TYLENOL) tablet 975 mg: 975 mg | ORAL | @ 15:00:00 | Stop: 2021-06-16

## 2021-06-16 MED ADMIN — lactated Ringers infusion: INTRAVENOUS | @ 16:00:00 | Stop: 2021-06-16

## 2021-06-16 MED ADMIN — propofoL (DIPRIVAN) injection: INTRAVENOUS | @ 16:00:00 | Stop: 2021-06-16

## 2021-06-16 MED ADMIN — ROCuronium (ZEMURON) injection: INTRAVENOUS | @ 16:00:00 | Stop: 2021-06-16

## 2021-06-16 MED ADMIN — ePHEDrine (PF) 25 mg/5 mL (5 mg/mL) in 0.9% sodium chloride syringe Syrg: INTRAVENOUS | @ 16:00:00 | Stop: 2021-06-16

## 2021-06-16 MED ADMIN — bacitracin-polymyxin b (POLYSPORIN) ointment: TOPICAL | @ 18:00:00 | Stop: 2021-06-16

## 2021-06-16 MED ADMIN — dexamethasone (DECADRON) 4 mg/mL injection: INTRAVENOUS | @ 16:00:00 | Stop: 2021-06-16

## 2021-06-16 MED ADMIN — midazolam (VERSED) injection: INTRAVENOUS | @ 16:00:00 | Stop: 2021-06-16

## 2021-06-16 MED ADMIN — lidocaine (XYLOCAINE) 10 mg/mL (1 %) injection: SUBCUTANEOUS | @ 18:00:00 | Stop: 2021-06-16

## 2021-06-16 NOTE — Unmapped (Signed)
VIR Post-Procedure Note    Procedure Name: CT CRYOABLATION NECK/THORAX [ZOX0960]    Pre-Op Diagnosis: Left shoulder d    Post-Op Diagnosis: Same as pre-operative diagnosis    VIR Providers    Attending: Dr. Ammie Dalton  Assistant: Dr. Gerilyn Pilgrim L. Tashica Provencio    Description of procedure:  The area of interest was prepped in the normal sterile fashion.  CT guidance was used to achieve access to the desmoid tumor with 3 cryo probes and  Freezing was achieved to -40 deg celsius within the targeted area.  Successful CT guided cryoablation of left shoulder desmoid tumor.    Sedation: general anesthesia    Estimated Blood Loss: approximately 0 mL  Specimens: None   Contrast: 0 mL  Complications: None      See detailed procedure note with images in PACS (IMPAX).    The patient tolerated the procedure well without incident or complication and was returned to the PRU in stable condition.  If the patient is still in uncontrollable pain after two hour recovery, we will consult hospitalist in order to admit for 24 hours for management.     Signed:  Epifania Gore. Rock Nephew, MD, VIR Fellow  Department of VIR/Radiology, PGY-6  [facility]    06/16/2021 2:23 PM

## 2021-06-16 NOTE — Unmapped (Incomplete)
Assessment/Plan:    Ms. Filter is a 50 y.o. female who will undergo percutaneous cryoablation of left shoulder desmoid tumor in Interventional Radiology.    --This procedure has been fully reviewed with the patient/patient???s authorized representative. The risks, benefits and alternatives have been explained, and the patient/patient???s authorized representative has consented to the procedure.  --The patient will accept blood products in an emergent situation.  --The patient does not have a Do Not Resuscitate order in effect.    HPI: Ms. Jamerson is a 50 y.o. female here for percutaneous cryoablation of left shoulder desmoid tumor. Informed consent was obtained by Dr. Marlane Mingle from the patient and placed in the patient's chart.    Allergies:   Allergies   Allergen Reactions   ??? Augmentin [Amoxicillin-Pot Clavulanate] Hives   ??? Penicillins Hives       Medications:  No relevant medications, please see full medication list in Epic.    ASA Grade: ASA 2 - Patient with mild systemic disease with no functional limitations    PSH: No past surgical history on file.    PMH: No past medical history on file.    PE:    There were no vitals filed for this visit.  General: WD, WN female in NAD.   HEENT: Normocephalic, atraumatic.   Lungs: Respirations nonlabored  Mallampati Class:  {housemallampati:[id number]}        Vista Mink, MD  06/16/2021, 9:17 AM

## 2021-06-17 NOTE — Unmapped (Signed)
Hi,    Patient Heidi Woodard called requesting a medication refill for the following:    ??? Medication: Oxycodone  ??? Dosage: 5 mg  ??? Days left of medication: 0 (refill for 05/21/21)  ??? [facility], [place]    The expected turnaround time is 3-4 business days     Thank you,  Yolanda Bonine  [facility] Cancer Communication Center  [phone]

## 2021-06-17 NOTE — Unmapped (Signed)
Discharge instructions reviewed and given to pt and family. Understanding verbalized. PIV removed with tip intact. Home supplies given for dressing changes. Pt ambulatory without assistance. Discharged from PRU in stable condition.

## 2021-06-17 NOTE — Unmapped (Signed)
Post procedure call completed, patient states is doing well. Patient advised to call with any questions and concerns.

## 2021-06-18 NOTE — Unmapped (Signed)
Hi,    Patient Heidi Woodard called requesting a medication refill for the following:    ??? Medication: Oxycodone  ??? Dosage: 5mg   ??? Days left of medication: 0  ??? Pharmacy: [facility] [address], [place] - [address]   [address] [place], [place] [address]   Phone:  [phone] ??Fax:  [phone]    The expected turnaround time is 3-4 business days       Check Indicates criteria has been reviewed and confirmed with the patient:    [x]  Preferred Name   [x]  DOB and/or MR#  [x]  Preferred Contact Method  [x]  Phone Number(s)   [x]  Preferred Pharmacy   []  MyChart     Thank you,  Iona Hansen  [facility] Cancer Communication Center  [phone]

## 2021-06-19 MED ORDER — OXYCODONE 5 MG TABLET
ORAL_TABLET | Freq: Four times a day (QID) | ORAL | 0 refills | 23.00000 days | Status: CP | PRN
Start: 2021-06-19 — End: 2021-06-19

## 2021-06-19 NOTE — Unmapped (Signed)
Called pt to let her know script was sent to [facility] as requested.She will call back with any further needs.

## 2021-06-19 NOTE — Unmapped (Signed)
Hi,    Patient Heidi Woodard called requesting a medication refill for the following:    ??? Medication: Oxycodone  ??? Dosage: 5 mg  ??? Days left of medication: 0  ??? [facility] [place]      The expected turnaround time is 3-4 business days       Check Indicates criteria has been reviewed and confirmed with the patient:    [x]  Preferred Name   [x]  DOB and/or MR#  [x]  Preferred Contact Method  [x]  Phone Number(s)   [x]  Preferred Pharmacy   []  MyChart     Thank you,  [name]  [facility] Cancer Communication Center  [phone]

## 2021-06-21 MED ORDER — OXYCODONE 5 MG TABLET
ORAL_TABLET | 0 refills | 0 days | Status: CP
Start: 2021-06-21 — End: ?

## 2021-06-29 MED FILL — NEXAVAR 200 MG TABLET: ORAL | 30 days supply | Qty: 30 | Fill #4

## 2021-07-01 MED ORDER — MORPHINE ER 15 MG TABLET,EXTENDED RELEASE
ORAL_TABLET | Freq: Every evening | ORAL | 0 refills | 30 days
Start: 2021-07-01 — End: ?

## 2021-07-01 NOTE — Unmapped (Signed)
Hi,    Patient LAVELLE BERLAND called requesting a medication refill for the following:    ??? Medication: Morphine  ??? Dosage: 12mg   ??? Days left of medication: 4  ??? [facility]        Check Indicates criteria has been reviewed and confirmed with the patient:    [x]  Preferred Name   [x]  DOB and/or MR#  [x]  Preferred Contact Method  [x]  Phone Number(s)   [x]  Preferred Pharmacy   []  MyChart     Thank you,  [name]  [facility] Cancer Communication Center  [phone]

## 2021-07-02 NOTE — Unmapped (Signed)
Hi,    Patient KINJAL NEITZKE called requesting a medication refill for the following:    ??? Medication: MORPhine  ??? Dosage: 15 mg  ??? Days left of medication: 5  ??? [facility]      The expected turnaround time is 3-4 business days       Thank you,  Yolanda Bonine  [facility] Cancer Communication Center  [phone]

## 2021-07-03 MED ORDER — MORPHINE ER 15 MG TABLET,EXTENDED RELEASE
ORAL_TABLET | Freq: Two times a day (BID) | ORAL | 0 refills | 0.00000 days | Status: CP
Start: 2021-07-03 — End: ?

## 2021-07-03 NOTE — Unmapped (Signed)
Called pt to let her know refill was sent in as requested. Will call back with any further needs.

## 2021-07-07 NOTE — Unmapped (Signed)
Hi,    Patient Heidi Woodard called requesting a medication refill for the following:    ??? Medication: Oxycodone  ??? Dosage: 5mg   ??? Days left of medication: 0  ??? Pharmacy: [facility]          Check Indicates criteria has been reviewed and confirmed with the patient:    [x]  Preferred Name   [x]  DOB and/or MR#  [x]  Preferred Contact Method  [x]  Phone Number(s)   [x]  Preferred Pharmacy   []  MyChart     Thank you,  [name]  [facility] Cancer Communication Center  [phone]

## 2021-07-20 MED ORDER — OXYCODONE 5 MG TABLET
ORAL_TABLET | Freq: Four times a day (QID) | ORAL | 0 refills | 23 days | Status: CP | PRN
Start: 2021-07-20 — End: 2021-08-19

## 2021-07-20 NOTE — Unmapped (Signed)
[place] INTERVENTIONAL RADIOLOGY - FOLLOW UP VISIT    Patient Name: Heidi Woodard  Patient Age: 50 y.o.  Encounter Date: 07/21/2021  Attending Interventional Radiologist: Dr. Ammie Dalton  Resident Interventional Radiologist: Estevan Oaks, MD    REFERRING PHYSICIAN: Ammie Dalton, MD  [address]  Radiology  [id number] Old Clinic Building  [place],  [place] [address]  PRIMARY CARE PROVIDER: [facility]      Subjective:     Chief Complaint: Follow-up visit    History of Present Illness:  Heidi Woodard is a 50 y.o. female who returns to clinic today. 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 Since her procedure 1 month ago she endorses swelling at the procedure site. She states it is fairly unchanged. Additionally due to the swelling she states that her pain is now 8/10 compared to 5/10 prior to the procedure. She denies any bleeding from the procedure site. She denies any pain or swelling in the arm or changes in sensation or movement of the arm and hand. Additionally she denies any fevers or chills.     Review of Systems: Pertinent positives and negatives are noted in the HPI above. Review of systems otherwise negative.    Social History:    Social History     Socioeconomic History   ??? Marital status: Single   Tobacco Use   ??? Smoking status: Never Smoker   ??? Smokeless tobacco: Never Used   Substance and Sexual Activity   ??? Alcohol use: No   ??? Drug use: No       Allergies:  Allergies   Allergen Reactions   ??? Augmentin [Amoxicillin-Pot Clavulanate] Hives   ??? Penicillins Hives       Medications:    Current Outpatient Medications:   ???  celecoxib (CELEBREX) 200 MG capsule, Take 200 mg by mouth daily., Disp: , Rfl:   ???  cyclobenzaprine (FLEXERIL) 10 MG tablet, Take 10 mg by mouth daily., Disp: , Rfl:   ???  lisinopril-hydrochlorothiazide (PRINZIDE,ZESTORETIC) 20-25 mg per tablet, Take 1 tablet by mouth daily., Disp: , Rfl:   ???  MORPhine (MS CONTIN) 15 MG 12 hr tablet, Take 1 tablet (15 mg total) by mouth at bedtime., Disp: 30 tablet, Rfl: 0  ???  ondansetron (ZOFRAN) 8 MG tablet, Take 1 tablet (8 mg total) by mouth every eight (8) hours as needed for nausea., Disp: 30 tablet, Rfl: 3  ???  oxyCODONE (ROXICODONE) 5 MG immediate release tablet, Take 1 tablet (5 mg total) by mouth every six (6) hours as needed for pain (moderate to severe pain)., Disp: 90 tablet, Rfl: 0  ???  potassium gluconate 2.5 mEq Tab, Take 1 tablet by mouth., Disp: , Rfl:   ???  simvastatin (ZOCOR) 40 MG tablet, Take 40 mg by mouth nightly., Disp: , Rfl:   ???  SORAfenib (NEXAVAR) 200 mg tablet, Take 1 tablet (200 mg total) by mouth daily., Disp: 30 tablet, Rfl: 11  ???  therapeutic multivitamin (THERAGRAN) tablet, Take 1 tablet by mouth daily., Disp: , Rfl:   ???  urea (CARMOL) 10 % cream, APPLY NIGHTLY TO AFFECTED PRESSURE CALLOUS ON FOOT, Disp: 85 g, Rfl: 5  ???  urea-alpha hydroxy acids 10-4 % Crea, APPLY NIGHTLY TO AFFECTED PRESSURE CALLOUS ON FOOT, Disp: , Rfl:      Objective:  Physical Exam    Vital Signs:   Vitals:    07/21/21 0942   BP: 121/86   Pulse: 79   Resp: 16   Temp: 36.1 ??C (97 ??F)   SpO2: 96%     There is no height or weight on file to calculate BMI.  General: Well developed, well nourished female in no acute distress.  HEENT: Normocephalic, atraumatic, anicteric sclera.   Cardiovascular: Regular rate.  Pulmonary: Normal work of breathing on room air.  Musculoskeletal: Mild swelling over the left shoulder/upper scapula at the procedure site.  Skin: Incision sites well healed without scar  Neurologic: Alert and oriented x 3. No obvious focal deficits.  Psych: Appropriate affect.    Pertinent Laboratory Values:   Lab Results   Component Value Date    WBC 6.4 06/16/2021    HGB 12.6 06/16/2021    HCT 37.4 06/16/2021    PLT 255 06/16/2021       Lab Results   Component Value Date NA 138 05/28/2021    K 3.9 05/28/2021    CL 99 05/28/2021    CO2 27 05/28/2021    BUN 12 05/28/2021    CREATININE 0.96 05/28/2021    GLU 85 03/24/2021    CALCIUM 9.0 05/28/2021    MG 1.6 05/20/2020       Lab Results   Component Value Date    BILITOT 0.3 05/28/2021    PROT 7.3 05/28/2021    ALBUMIN 3.6 03/24/2021    ALT 22 05/28/2021    AST 28 05/28/2021    ALKPHOS 88 05/28/2021       Lab Results   Component Value Date    LABPROT 8.5 09/17/2016    INR 1.09 06/16/2021       Imaging Studies: None reviewed for this visit.     Assessment:   Heidi Woodard is a 50 y.o. female who presents for follow-up s/p 3 probe cryoablation of left shoulder desmoid tumor on 06/16/2021. She endorses persistent mild swelling about he left shoulder after the procedure and believes this is why she is having worse pain. Given symptoms we have no concern for infection at this time. We discussed how this swelling is likely due to inflammatory changes secondary to necrosis 2/2 cryoablation vs less likely interval tumor growth. We explained how this will likely improve over time and ice packs can be used to help with the swelling.     Plan (Medical Decision Making):   Patient is already scheduled for MRI of the left shoulder for evaluation of the mass next month so we will await the results of this MRI to evaluate whether these symptoms are from inflammatory changes vs interval mass growth. Patient can continue with antiinflammatory medication PRN and ice packs to the procedure site for swelling relief. We discussed if this is due to interval mass growth that we can schedule an additional cryoablation without having to see her in clinic.     All of the patient's questions answered to her satisfaction.      Synopsis:  -symptomatic treatment of swelling  -f/u results of MRI  -potential reintervention if necessary    Patient seen and discussed with Dr. Ammie Dalton who is in agreement with the above assessment and plan.     Estevan Oaks, MD  PGY-3, Division of Interventional Radiology  [facility]

## 2021-07-20 NOTE — Unmapped (Signed)
Returned call to pt. Let her know refill has been sent in as requested.She will call back with any further needs.

## 2021-07-20 NOTE — Unmapped (Signed)
Hi,    Patient NIAMH RADA called requesting a medication refill for the following:    ??? Medication: Oxycodone  ??? Dosage: 5mg   ??? Days left of medication: 0  ??? Pharmacy: [facility]      The expected turnaround time is 3-4 business days       Check Indicates criteria has been reviewed and confirmed with the patient:    [x]  Preferred Name   [x]  DOB and/or MR#  [x]  Preferred Contact Method  [x]  Phone Number(s)   [x]  Preferred Pharmacy   []  MyChart     Thank you,  [name]  [facility] Communication Center  [phone]

## 2021-07-21 ENCOUNTER — Ambulatory Visit
Admit: 2021-07-21 | Discharge: 2021-07-22 | Payer: BLUE CROSS/BLUE SHIELD | Attending: Diagnostic Radiology | Primary: Diagnostic Radiology

## 2021-07-21 DIAGNOSIS — D481 Neoplasm of uncertain behavior of connective and other soft tissue: Principal | ICD-10-CM

## 2021-07-21 NOTE — Unmapped (Signed)
I have seen and examined this patient and discussed their management with the fellow, Dr. Estevan Oaks and with the patient. I agree with the recommendations outlined above.     She is having mild swelling over the left shoulder where previous cryoablation was performed. This likely due to inflammatory change rather than tumor growth. Her pain level has also been worse from 5/10 to 8/10. This may also be related to the post-procedure inflammatory change which may take more time to get improved.     Plan: MRI of the shoulder (already scheduled) will be evaluated for the next step of management.    I personally spent 15 minutes face-to-face with the patient and greater than 50% of that time was spent in counseling or coordinating care with the patient regarding post-cryoablation change of her left shoulder desmoid tumor.       Ammie Dalton MD  Professor of Radiology  Vascular and Interventional Radiology  [facility], [place]

## 2021-07-27 NOTE — Unmapped (Signed)
[facility] Clinical Assessment & Refill Coordination Note    Heidi Woodard, DOB: Dec 19, 1970  Phone: [phone] (home) [phone] (work)    All above HIPAA information was verified with patient.     Was a Nurse, learning disability used for this call? No    Specialty Medication(s):   Hematology/Oncology: Nexavar     Current Outpatient Medications   Medication Sig Dispense Refill   ??? celecoxib (CELEBREX) 200 MG capsule Take 200 mg by mouth daily.     ??? cyclobenzaprine (FLEXERIL) 10 MG tablet Take 10 mg by mouth daily.     ??? lisinopril-hydrochlorothiazide (PRINZIDE,ZESTORETIC) 20-25 mg per tablet Take 1 tablet by mouth daily.     ??? MORPhine (MS CONTIN) 15 MG 12 hr tablet Take 1 tablet (15 mg total) by mouth at bedtime. 30 tablet 0   ??? ondansetron (ZOFRAN) 8 MG tablet Take 1 tablet (8 mg total) by mouth every eight (8) hours as needed for nausea. 30 tablet 3   ??? oxyCODONE (ROXICODONE) 5 MG immediate release tablet Take 1 tablet (5 mg total) by mouth every six (6) hours as needed for pain (moderate to severe pain). 90 tablet 0   ??? potassium gluconate 2.5 mEq Tab Take 1 tablet by mouth.     ??? simvastatin (ZOCOR) 40 MG tablet Take 40 mg by mouth nightly.     ??? SORAfenib (NEXAVAR) 200 mg tablet Take 1 tablet (200 mg total) by mouth daily. 30 tablet 11   ??? therapeutic multivitamin (THERAGRAN) tablet Take 1 tablet by mouth daily.     ??? urea (CARMOL) 10 % cream APPLY NIGHTLY TO AFFECTED PRESSURE CALLOUS ON FOOT 85 g 5   ??? urea-alpha hydroxy acids 10-4 % Crea APPLY NIGHTLY TO AFFECTED PRESSURE CALLOUS ON FOOT       No current facility-administered medications for this visit.        Changes to medications: Heidi Woodard reports no changes at this time.    Allergies   Allergen Reactions   ??? Augmentin [Amoxicillin-Pot Clavulanate] Hives   ??? Penicillins Hives       Changes to allergies: No    SPECIALTY MEDICATION ADHERENCE     Nexavar 200 mg: 6 days of medicine on hand     Medication Adherence    Patient reported X missed doses in the last month: 0  Specialty Medication: Nexavar 200 mg - 1 tablet once daily  Patient is on additional specialty medications: No  Informant: patient  Confirmed plan for next specialty medication refill: delivery by pharmacy  Refills needed for supportive medications: not needed          Specialty medication(s) dose(s) confirmed: Regimen is correct and unchanged.     Are there any concerns with adherence? No    Adherence counseling provided? Not needed    CLINICAL MANAGEMENT AND INTERVENTION      Clinical Benefit Assessment:    Do you feel the medicine is effective or helping your condition? Yes    Clinical Benefit counseling provided? Not needed    Adverse Effects Assessment:    Are you experiencing any side effects? No    Are you experiencing difficulty administering your medicine? No    Quality of Life Assessment:    Quality of Life    Rheumatology  Oncology  1. What impact has your specialty medication had on the reduction of your daily pain or discomfort level?: Tremendous  2. On a scale of 1-10, how would you rate your ability  to manage side effects associated with your specialty medication? (1=no issues, 10 = unable to take medication due to side effects): 1  Dermatology  Cystic Fibrosis          How many days over the past month did your Desmoid fibramatosis  keep you from your normal activities? For example, brushing your teeth or getting up in the morning. 0    Have you discussed this with your provider? Not needed    Acute Infection Status:    Acute infections noted within Epic:  No active infections  Patient reported infection: None    Therapy Appropriateness:    Is therapy appropriate and patient progressing towards therapeutic goals? Yes, therapy is appropriate and should be continued    DISEASE/MEDICATION-SPECIFIC INFORMATION      N/A    PATIENT SPECIFIC NEEDS     - Does the patient have any physical, cognitive, or cultural barriers? No    - Is the patient high risk? Yes, patient is taking oral chemotherapy. Appropriateness of therapy as been assessed    - Does the patient require a Care Management Plan? No     - Does the patient require physician intervention or other additional services (i.e. nutrition, smoking cessation, social work)? No      SHIPPING     Specialty Medication(s) to be Shipped:   Hematology/Oncology: Nexavar    Other medication(s) to be shipped: No additional medications requested for fill at this time     Changes to insurance: No    Delivery Scheduled: Yes, Expected medication delivery date: 07/30/21.     Medication will be delivered via UPS to the confirmed prescription address in [facility].    The patient will receive a drug information handout for each medication shipped and additional FDA Medication Guides as required.  Verified that patient has previously received a Conservation officer, historic buildings and a Surveyor, mining.    The patient or caregiver noted above participated in the development of this care plan and knows that they can request review of or adjustments to the care plan at any time.      All of the patient's questions and concerns have been addressed.    Breck Coons Shared [facility] Specialty Pharmacist

## 2021-07-27 NOTE — Unmapped (Signed)
Hi,    Patient Heidi Woodard called requesting a medication refill for the following:    ??? Medication: Morphine  ??? Dosage: 15mg   ??? Days left of medication: 0  ??? Pharmacy: [facility]      The expected turnaround time is 3-4 business days       Check Indicates criteria has been reviewed and confirmed with the patient:    [x]  Preferred Name   [x]  DOB and/or MR#  [x]  Preferred Contact Method  [x]  Phone Number(s)   [x]  Preferred Pharmacy   []  MyChart     Thank you,  [name]  [facility] Cancer Communication Center  [phone]

## 2021-07-28 MED ORDER — MORPHINE ER 15 MG TABLET,EXTENDED RELEASE
ORAL_TABLET | Freq: Every evening | ORAL | 0 refills | 30 days
Start: 2021-07-28 — End: ?

## 2021-07-28 NOTE — Unmapped (Signed)
.  Hi,    Patient Heidi Woodard called requesting a medication refill for the following:    ??? Medication: Morphine  ??? Dosage: 15mg   ??? Days left of medication: 5  ??? Pharmacy: [facility] in [place]    Patient is requesting a medication refill as she is almost out of her medication. Please contact patient back once this medication has been called in and she is able to pick it up at [phone].       Thank you,  Noland Fordyce  [facility] Communication Center  [phone]

## 2021-07-29 MED FILL — NEXAVAR 200 MG TABLET: ORAL | 30 days supply | Qty: 30 | Fill #5

## 2021-07-30 NOTE — Unmapped (Signed)
Hi,     Jalyah Weinheimer contacted the Communication Center requesting to speak with the care team of ZAMIYAH RESENDES to discuss:    Patient following up on 9/26 refill request for Morphine 15 mg.    Please contact Aleiya Rye at [phone].      Thank you,   Yolanda Bonine  [facility] Communication Center   [phone]

## 2021-07-31 MED ORDER — MORPHINE ER 15 MG TABLET,EXTENDED RELEASE
ORAL_TABLET | Freq: Every evening | ORAL | 0 refills | 30 days | Status: CP
Start: 2021-07-31 — End: ?

## 2021-07-31 MED ORDER — OXYCODONE 5 MG TABLET
ORAL_TABLET | Freq: Four times a day (QID) | ORAL | 0 refills | 23 days | Status: CP | PRN
Start: 2021-07-31 — End: 2021-08-30

## 2021-07-31 NOTE — Unmapped (Signed)
Returned call to pt. Let her know refill was sent to her pharmacy as requested. Will call back with any further needs.

## 2021-07-31 NOTE — Unmapped (Signed)
Called [facility] at the request of Dr. Meredith Mody to discontinue oxycodone script sent in today. Requested they fill MS Contin.Heidi Woodard verbalizes understanding.

## 2021-08-03 DIAGNOSIS — D481 Neoplasm of uncertain behavior of connective and other soft tissue: Principal | ICD-10-CM

## 2021-08-14 NOTE — Unmapped (Signed)
Hi,    Patient Heidi Woodard called requesting a medication refill for the following:    ??? Medication: oxycodone  ??? Dosage: 5 mg  ??? Days left of medication: 10 pills remaining  ??? Pharmacy: [facility] in [place]    The expected turnaround time is 3-4 business days     Thank you,  Kelli Hope  [place] Communication Center  [phone]

## 2021-08-17 NOTE — Unmapped (Signed)
Called patient regarding refill request for oxy.   90 tabs ordered 9/30.  Advised pt that it's still a little early for a refill, she clarified it's not for the morphine--it's for the oxy. He usually fills that on the 18th of every month.  She reports she has 6 oxy pills left. She reports that she is taking 3 pills per day. Informed patient I will let the MD know but that it seems to be too soon for refill.

## 2021-08-17 NOTE — Unmapped (Signed)
Hi,     Caron contacted the [employer] regarding the following:    - States that she is calling to get an update on her prescription for Oxycodone 5mg  being sent in.     Please contact Jasmyn at [phone].    Thanks in advance,    [name]  [facility] Communication Center   [phone]

## 2021-08-18 MED ORDER — OXYCODONE 5 MG TABLET
ORAL_TABLET | Freq: Four times a day (QID) | ORAL | 0 refills | 23.00000 days | Status: CP | PRN
Start: 2021-08-18 — End: 2021-09-17

## 2021-08-18 NOTE — Unmapped (Signed)
Returned call to pt. Let her know refill was sent in as requested and she may pick it up tomorrow. She will call back with any further needs.

## 2021-08-18 NOTE — Unmapped (Signed)
Hi,    Patient Heidi Woodard called requesting a medication refill for the following:    ??? Medication: oxyCODONE (ROXICODONE)  ??? Dosage: 5 MG immediate release tablet   ??? Days left of medication: 0  ??? Pharmacy: [facility] [address], [place] [address]   o Phone:  [phone] ??Fax:  [phone]      Thank you,  Warnell Bureau  [facility] Communication Center  [phone]

## 2021-08-26 ENCOUNTER — Ambulatory Visit: Admit: 2021-08-26 | Discharge: 2021-08-27 | Payer: BLUE CROSS/BLUE SHIELD

## 2021-08-26 MED ADMIN — gadobenate dimeglumine (MULTIHANCE) 529 mg/mL (0.1mmol/0.2mL) solution 10 mL: 10 mL | INTRAVENOUS | @ 14:00:00 | Stop: 2021-08-26

## 2021-08-26 NOTE — Unmapped (Signed)
[facility] BONE AND SOFT TISSUE ONCOLOGY RETURN VISIT    Encounter Date: 08/28/2021  Patient Name: Heidi Woodard  Medical Record Number: [id number]    Referring Physician: Sheral Apley, MD  [address]  [facility] [address] [place],  [place] [address]    Primary Care Provider: [facility] Department    DIAGNOSIS:  1. Desmoid fibromatosis    2. Chronic left shoulder pain    3. Callous ulcer, limited to breakdown of skin (CMS-HCC)    4. Primary hypertension        ASSESSMENT/PLAN: 50 y.o. female with large shoulder desmoid fibromatosis    Desmoid tumor:  - On sorafenib (started Jan 2018) due to progressive symptoms  - DR to sorafenib 200mg  po qhs d/t rash. Continue prn topical steroids  - Continue urea cream on callouses on feet and also along neck.   - MRI Left shoulder, 02/08/20, similar to decreased bulk of shoulder mass  - 05/20/20: held sorafenib in anticipation of TKR on 05/26/20  - 08/04/20: MRI Left shoulder, similar size but increase in pain/swelling. Restarted sorafenib 200 mg daily.    - 12/12/20: Continues on sorafenib 200 mg daily, doing well. Improvement in desmoid associated pain, manageable with low dose opioids. HFS, HTN, nausea under control.  -02/13/21: Pain progressively worse over past few months, requiring increasing doses of opioids. MRI is stable/slightly better. However, given her pain, referred for cryoablation, Dr. Cathie Hoops feels it is amenable.  -05/29/21: Seen in follow up. Tolerating sorafenib well. Pain is stable on opioid regimen.    -08/28/21: MRI shows essentially stable disease although minimal growth. Pain and range of motion are stable today. Continue stable dose of sorafenib. Will ask Dr. Cathie Hoops to review MRI if pain worsens.  ??  -Possible future options if progression: doxil, ongoing sorafenib (dose escalation may not be well tolerated), pazopanib, observation.  ??  Essential HTN - likely exacerbated by sorafenib  - asked patient to get BP cuff and call if BP continues >140/90  - continues on hydrochlorothiazide/lisinopril  ??  Desmoid tumor associated pain - improved with sorafenib, then a bit worse. Had follow up visit in May for discussion about safe prescribing, adherence to dose as prescribed, inability to do any more early refills, as she had been requesting several early refills due to escalating pain.  ??  Nighttime MS contin 15mg  (#30 per month)??  Oxycodone 5mg  Q4h prn, max 3 pills daily (changed to #100 monthly)  -will do short refill of morphine on 11/2 to re-align refills  ??  No early refills moving forward without clear discussion of the situation.  ??  HFS, mild   - continue urea 10-20% cream - escalate to TID given calluses on hands  - continue aggressive emollient use to prevent dry skin  ??  Birth control: Says she is not sexually active. Aware of teratogenic risk of pregnancy on sorafenib.  ??  Dispo:  F/U 3 mos without scans    I personally reviewed the medical records, pathology and laboratory results and viewed the imaging. All questions were answered to the patient's apparent satisfaction and they voiced understanding and agreement with the plan. Pt has my card and contact information and is encouraged to reach out with any further questions.    Donzetta Sprung, MD/MPH  Assistant Professor  Bone and Soft Tissue Oncology Program  [facility]  Pager: [phone]  Nurse Navigator: [name] RN, BSN, OCN      REASON  FOR CONSULTATION:   Heidi Woodard is a 50 y.o. female who is seen in consultation at the request of Sheral Apley, MD for evaluation of her desmoid fibromatosis.      HISTORY OF PRESENT ILLNESS:      In 2016, pt developed shoulder pain associated with a fall. There was bruising and swelling but she attributed it to the injury. It never improved, and by summer she noted increased swelling and decreased ROM (which is limited by pain, rather than painless weakness). 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 She underwent CT of the left shoudler on 06/25/16 which showed a 16 cm mass involing the left deltoid with likely invasion of the left trapezius, triceps, and teres minor. Possible LN involvement of axillary and supraclav LN on left were present. On 07/07/2016 she saw Dr. Darral Dash and ultrasound-guided biopsy was performed with pathology revealing low-grade spindle cell lesion favoring fibromatosis. He felt that surgical resection would be highly morbid. On 9/20/017, she saw Dr Beckey Rutter, who did not favor RT due to inherent risks associated with RT.   ??  She has ongoing pain, some tingling along the backs of her hands. Pain is intermittent if her arm is at rest, happening about every other day but when it happens it is very intense. Per above it limits her ADLs although she says she is managing.  ??  Interval history:   She is doing okay today. Pain is stable, about a 5/10 today. Range of motion is about the same, maybe a little better. She is largely able to do ADLs and what she needs to do. Accompanied by her partner. Looking forward to a low-key holiday season, her parents have passed away.    REVIEW OF SYSTEMS:  A comprehensive review of 12 systems was negative except for pertinent positives noted in HPI.    Past Medical History, Surgical History, Family History were reviewed personally. Any changes were updated as above.    Social History:  Lives in [place]  Has a supportive boyfriend  No t/e/d  40 year old daughter    Pregnancy status: perimenopausal, not sexually active      ALLERGIES/MEDICATIONS:  Reviewed in Epic    PHYSICAL EXAM:   BP 135/89  - Pulse 63  - Temp 35.6 ??C (96.1 ??F) (Temporal)  - Resp 16  - Ht 160 cm (5' 3)  - Wt (!) 105.8 kg (233 lb 3.2 oz)  - SpO2 100%  - BMI 41.31 kg/m??   ECOG 0  Gen - well appearing, in no acute distress  Eyes - conjunctivae clear, PERRL  ENT - mask in place  Lymph - no cervical or supraclavicular lymphadenopathy appreciated  Resp - clear to auscultation bilaterally, no wheezes, crackles or rales  CV - normal rate, regular rhythm, no murmur appreciated, no lower extremity edema noted  GI - abdomen soft, nontender, nondistended, no masses, bowel sounds normal  Skin - no rashes, no lesions noted  Neuro - AOx3, EOM intact, no facial droop, speech fluent and coherent, moves all extremities without asymmetry  Psych - affect normal, mood okay  MSK - left shoulder mass, ill defined but palpable, minimal tenderness. Two small bruised areas, no fluctuance or drainage, low concern.    PATHOLOGY:   Pathology was personally reviewed as described in the HPI, detailed in Epic.     LABS:  Reviewed in Epic  RADIOLOGY:  Imaging was personally viewed and interpreted as summarized in the history (see above).

## 2021-08-27 MED ORDER — ONDANSETRON HCL 8 MG TABLET
ORAL_TABLET | Freq: Three times a day (TID) | ORAL | 3 refills | 10 days | Status: CN | PRN
Start: 2021-08-27 — End: ?

## 2021-08-27 NOTE — Unmapped (Signed)
[facility] Refill Coordination Note    Specialty Medication(s) to be Shipped:   Hematology/Oncology: Nexavar    Other medication(s) to be shipped: No additional medications requested for fill at this time     Heidi Woodard, DOB: 1971-05-06  Phone: [phone] (home) [phone] (work)      All above HIPAA information was verified with patient.     Was a Nurse, learning disability used for this call? No    Completed refill call assessment today to schedule patient's medication shipment from the Heidi Woodard [phone]).  All relevant notes have been reviewed.     Specialty medication(s) and dose(s) confirmed: Regimen is correct and unchanged.   Changes to medications: [name] reports no changes at this time.  Changes to insurance: No  New side effects reported not previously addressed with a pharmacist or physician: None reported  Questions for the pharmacist: No    Confirmed patient received a Conservation officer, historic buildings and a Surveyor, mining with first shipment. The patient will receive a drug information handout for each medication shipped and additional FDA Medication Guides as required.       DISEASE/MEDICATION-SPECIFIC INFORMATION        N/A    SPECIALTY MEDICATION ADHERENCE     Medication Adherence    Patient reported X missed doses in the last month: 0  Specialty Medication: nexavar 200  Patient is on additional specialty medications: No  Patient is on more than two specialty medications: No  Any gaps in refill history greater than 2 weeks in the last 3 months: no  Demonstrates understanding of importance of adherence: yes  Informant: patient  Provider-estimated medication adherence level: good  Patient is at risk for Non-Adherence: No  Reasons for non-adherence: no problems identified  Confirmed plan for next specialty medication refill: delivery by [facility]  Refills needed for supportive medications: not needed          Refill Coordination    Has the Patients' Contact Information Changed: No  Is the Shipping Address Different: No         Were doses missed due to medication being on hold? No        Nexavar  200 mg: 7 days of medicine on hand     REFERRAL TO PHARMACIST     Referral to the pharmacist: Not needed      Heidi Woodard     Shipping address confirmed in Epic.     Delivery Scheduled: Yes, Expected medication delivery date: 11/01.     Medication will be delivered via UPS to the prescription address in Epic [place].    Heidi Woodard   Heidi [facility]ard Specialty Technician

## 2021-08-27 NOTE — Unmapped (Signed)
Returned call to pt. Let her know refill for zofran has been sent in as requested. Also let her know [facility] has been trying to get in touch with her concerning Nexavar. Gave her the phone number to call. States she will get in touch with them today.She will call back with any further needs.

## 2021-08-27 NOTE — Unmapped (Signed)
Hi,    Patient Heidi Woodard called requesting a medication refill for the following:    ??? Medication: Ondansetron  ??? Dosage: 8 mg  ??? Days left of medication: 0  ??? [facility]        The expected turnaround time is 3-4 business days       Check Indicates criteria has been reviewed and confirmed with the patient:    [x]  Preferred Name   [x]  DOB and/or MR#  [x]  Preferred Contact Method  [x]  Phone Number(s)   [x]  Preferred Pharmacy   []  MyChart     Thank you,  [name]  [facility] Communication Center  [phone]

## 2021-08-27 NOTE — Unmapped (Signed)
The [facility] has made a second and final attempt to reach this patient to refill the following medication:Nexavar.      We have left voicemails on the following phone numbers: [phone] and have been unable to leave messages on the following phone numbers: [phone].    Dates contacted: 10/17,24  Last scheduled delivery: 07/29/21    The patient may be at risk of non-compliance with this medication. The patient should call the [facility] at [phone]  Option 4, then Option 1 (oncology) to refill medication.    Wyatt Mage Hulda Humphrey   [facility] Specialty Technician

## 2021-08-28 ENCOUNTER — Other Ambulatory Visit: Admit: 2021-08-28 | Discharge: 2021-08-29 | Payer: BLUE CROSS/BLUE SHIELD

## 2021-08-28 ENCOUNTER — Ambulatory Visit: Admit: 2021-08-28 | Discharge: 2021-08-29 | Payer: BLUE CROSS/BLUE SHIELD

## 2021-08-28 DIAGNOSIS — D481 Neoplasm of uncertain behavior of connective and other soft tissue: Principal | ICD-10-CM

## 2021-08-28 DIAGNOSIS — M25512 Pain in left shoulder: Principal | ICD-10-CM

## 2021-08-28 DIAGNOSIS — L98491 Non-pressure chronic ulcer of skin of other sites limited to breakdown of skin: Principal | ICD-10-CM

## 2021-08-28 DIAGNOSIS — I10 Essential (primary) hypertension: Principal | ICD-10-CM

## 2021-08-28 DIAGNOSIS — G8929 Other chronic pain: Principal | ICD-10-CM

## 2021-08-28 LAB — COMPREHENSIVE METABOLIC PANEL
ALBUMIN: 3.7 g/dL (ref 3.4–5.0)
ALKALINE PHOSPHATASE: 80 U/L (ref 46–116)
ALT (SGPT): 19 U/L (ref 10–49)
ANION GAP: 8 mmol/L (ref 5–14)
AST (SGOT): 36 U/L — ABNORMAL HIGH (ref <=34)
BILIRUBIN TOTAL: 0.4 mg/dL (ref 0.3–1.2)
BLOOD UREA NITROGEN: 13 mg/dL (ref 9–23)
BUN / CREAT RATIO: 16
CALCIUM: 9.1 mg/dL (ref 8.7–10.4)
CHLORIDE: 102 mmol/L (ref 98–107)
CO2: 29 mmol/L (ref 20.0–31.0)
CREATININE: 0.83 mg/dL — ABNORMAL HIGH
EGFR CKD-EPI (2021) FEMALE: 85 mL/min/{1.73_m2} (ref >=60)
GLUCOSE RANDOM: 90 mg/dL (ref 70–179)
POTASSIUM: 3.7 mmol/L (ref 3.4–4.8)
PROTEIN TOTAL: 8 g/dL (ref 5.7–8.2)
SODIUM: 139 mmol/L (ref 135–145)

## 2021-08-28 LAB — CBC W/ AUTO DIFF
BASOPHILS ABSOLUTE COUNT: 0 10*9/L (ref 0.0–0.1)
BASOPHILS RELATIVE PERCENT: 0.4 %
EOSINOPHILS ABSOLUTE COUNT: 0.3 10*9/L (ref 0.0–0.5)
EOSINOPHILS RELATIVE PERCENT: 4.7 %
HEMATOCRIT: 37.9 % (ref 34.0–44.0)
HEMOGLOBIN: 12.7 g/dL (ref 11.3–14.9)
LYMPHOCYTES ABSOLUTE COUNT: 3.6 10*9/L (ref 1.1–3.6)
LYMPHOCYTES RELATIVE PERCENT: 48.6 %
MEAN CORPUSCULAR HEMOGLOBIN CONC: 33.5 g/dL (ref 32.0–36.0)
MEAN CORPUSCULAR HEMOGLOBIN: 28 pg (ref 25.9–32.4)
MEAN CORPUSCULAR VOLUME: 83.6 fL (ref 77.6–95.7)
MEAN PLATELET VOLUME: 7.7 fL (ref 6.8–10.7)
MONOCYTES ABSOLUTE COUNT: 0.4 10*9/L (ref 0.3–0.8)
MONOCYTES RELATIVE PERCENT: 5.4 %
NEUTROPHILS ABSOLUTE COUNT: 3 10*9/L (ref 1.8–7.8)
NEUTROPHILS RELATIVE PERCENT: 40.9 %
PLATELET COUNT: 312 10*9/L (ref 150–450)
RED BLOOD CELL COUNT: 4.53 10*12/L (ref 3.95–5.13)
RED CELL DISTRIBUTION WIDTH: 15.2 % (ref 12.2–15.2)
WBC ADJUSTED: 7.3 10*9/L (ref 3.6–11.2)

## 2021-08-31 MED FILL — NEXAVAR 200 MG TABLET: ORAL | 30 days supply | Qty: 30 | Fill #6

## 2021-09-01 MED ORDER — MORPHINE ER 15 MG TABLET,EXTENDED RELEASE
ORAL_TABLET | Freq: Every evening | ORAL | 0 refills | 30.00000 days
Start: 2021-09-01 — End: ?

## 2021-09-01 NOTE — Unmapped (Signed)
Hi,    Patient Heidi Woodard called requesting a medication refill for the following:    ??? Medication: MORPhine  ??? Dosage: 15 mg  ??? Days left of medication: 0  ??? Pharmacy: [facility] [place]    The expected turnaround time is 3-4 business days       Thank you,  Yolanda Bonine  [facility] Cancer Communication Center  [phone]

## 2021-09-02 NOTE — Unmapped (Signed)
Pt called again to check the status of this refill. I informed her that the refill was requested yesterday.    Thanks  [name]  [place] [facility] Communication Center

## 2021-09-03 NOTE — Unmapped (Signed)
Hi,     Avin Upperman contacted the Communication Center requesting to speak with the care team of Heidi Woodard to discuss:    Following up on refill request for MORPhine 15 mg.    Please contact Malaka Ruffner at [phone].      Thank you,   Yolanda Bonine  [facility] Cancer Communication Center   [phone]

## 2021-09-04 MED ORDER — MORPHINE ER 15 MG TABLET,EXTENDED RELEASE
ORAL_TABLET | Freq: Every evening | ORAL | 0 refills | 15 days | Status: CP
Start: 2021-09-04 — End: ?

## 2021-09-04 NOTE — Unmapped (Signed)
Returned call to pt. Let her know Dr. Meredith Mody sent in a refill for her morphine as discussed in the clinic.This will align morphine and oxycodone refills to the same time.She will call back with any further needs.

## 2021-09-14 NOTE — Unmapped (Signed)
Hi,     Lacy contacted the [facility] regarding the following:    states that Dr Meredith Mody told her to call to remind, that her prescriptions for Oxycodone and   Morphine needs refill for 09/18/2021    Please contact Eleina at [phone]    Thanks in advance,    Iona Hansen  [facility] Cancer Communication Center   [phone]

## 2021-09-15 NOTE — Unmapped (Signed)
[facility] Refill Coordination Note    Specialty Medication(s) to be Shipped:   Hematology/Oncology: Nexavar    Other medication(s) to be shipped: No additional medications requested for fill at this time     ARLISSA MONTEVERDE, DOB: 1971-02-20  Phone: [phone] (home) [phone] (work)      All above HIPAA information was verified with patient.     Was a Nurse, learning disability used for this call? No    Completed refill call assessment today to schedule patient's medication shipment from the [facility] [phone]).  All relevant notes have been reviewed.     Specialty medication(s) and dose(s) confirmed: Regimen is correct and unchanged.   Changes to medications: Necha reports no changes at this time.  Changes to insurance: No  New side effects reported not previously addressed with a pharmacist or physician: None reported  Questions for the pharmacist: No    Confirmed patient received a Conservation officer, historic buildings and a Surveyor, mining with first shipment. The patient will receive a drug information handout for each medication shipped and additional FDA Medication Guides as required.       DISEASE/MEDICATION-SPECIFIC INFORMATION        N/A    SPECIALTY MEDICATION ADHERENCE     Medication Adherence    Patient reported X missed doses in the last month: 0  Specialty Medication: Nexavar 200mg   Patient is on additional specialty medications: No  Informant: patient              Were doses missed due to medication being on hold? No    Nexavar 200 mg: 14 days of medicine on hand       REFERRAL TO PHARMACIST     Referral to the pharmacist: Not needed      [facility]     Shipping address confirmed in Epic.     Delivery Scheduled: Yes, Expected medication delivery date: 09/29/21.     Medication will be delivered via UPS to the prescription address in Epic [place].    [name]   [facility] Specialty Technician

## 2021-09-16 MED ORDER — OXYCODONE 5 MG TABLET
ORAL_TABLET | Freq: Four times a day (QID) | ORAL | 0 refills | 25.00000 days | Status: CP | PRN
Start: 2021-09-16 — End: 2021-10-16

## 2021-09-16 MED ORDER — MORPHINE ER 15 MG TABLET,EXTENDED RELEASE
ORAL_TABLET | Freq: Every evening | ORAL | 0 refills | 30 days | Status: CP
Start: 2021-09-16 — End: ?

## 2021-09-16 NOTE — Unmapped (Signed)
Returned call to pt. Let her know refills were sent to her pharmacy as requested. She will call back with any further needs.

## 2021-09-17 NOTE — Unmapped (Signed)
-  patient stated she picked up her morphine from the pharmacy but was unable to pick up the oxycodone.    -patient stated the pharmacy informed her that they needed a prior authorization for morphine    Please contact Casha at  [phone]    Thank you,   Warnell Bureau  [facility] Cancer Communication Center  [phone]

## 2021-09-18 DIAGNOSIS — G893 Neoplasm related pain (acute) (chronic): Principal | ICD-10-CM

## 2021-09-18 NOTE — Unmapped (Signed)
Hi,     Heidi Woodard contacted the Communication Center requesting to speak with the care team of Heidi Woodard to discuss:    States [facility] is needing authorization to fill MORPhine 15 MG prescription.     Please contact Turner Kunzman at [phone].      Thank you,   Yolanda Bonine  [facility] Cancer Communication Center   [phone]

## 2021-09-18 NOTE — Unmapped (Signed)
Called pt to let her know PA has been approved for her MS Contin.She will call back with any further needs.

## 2021-09-28 MED FILL — NEXAVAR 200 MG TABLET: ORAL | 30 days supply | Qty: 30 | Fill #7

## 2021-10-12 NOTE — Unmapped (Signed)
.  Hi,    Patient Heidi Woodard called requesting a medication refill for the following:    ??? Medication: Oxycodone/Morphine  ??? Dosage: 5mg /15mg   ??? Days left of medication: 11 pills left for Oxycodone and Morphine Patient has 7 pills   ??? [facility] [place]       Patient is calling requesting a medication refill on both medications Oxycodone and Morphine. Please refill this medication for the patient asap.     Please contact patient back at [phone].     Thank you,  Noland Fordyce  [facility] Cancer Communication Center  [phone]

## 2021-10-14 NOTE — Unmapped (Signed)
Hi,    Patient Heidi Woodard called requesting a medication refill for the following:    ??? Medication: oxycodone  ??? Dosage: 5mg   ??? Days left of medication: 0  ??? [facility]    ??? Medication: Morphine  ??? Dosage: 15mg   ??? Days left of medication: 0  ??? [facility]    The expected turnaround time is 3-4 business days       Check Indicates criteria has been reviewed and confirmed with the patient:    [x]  Preferred Name   [x]  DOB and/or MR#  [x]  Preferred Contact Method  [x]  Phone Number(s)   [x]  Preferred Pharmacy   []  MyChart     Thank you,  [name]  [facility] Cancer Communication Center  [phone]

## 2021-10-15 NOTE — Unmapped (Signed)
Hi,    Patient NYX KEADY called requesting a medication refill for the following:    ??? Medication: Morphine  ??? Dosage: 15 mg  ??? Days left of medication: 0  ??? [id number]  ??? Fax [phone]    ??? Medication: Oxycodone  ??? Dosage: 5 mg  ??? Days left of medication: 0  ??? Pharmacy[id number]  ???         Check Indicates criteria has been reviewed and confirmed with the patient:    []  Preferred Name   []  DOB and/or MR#  []  Preferred Contact Method  []  Phone Number(s)   []  Preferred Pharmacy   []  MyChart     Thank you,  Cipriano Mile  [facility] Cancer Communication Center  [phone]

## 2021-10-15 NOTE — Unmapped (Signed)
[facility] Refill Coordination Note    Specialty Medication(s) to be Shipped:   Hematology/Oncology: Nexavar    Other medication(s) to be shipped: No additional medications requested for fill at this time     Heidi Woodard, DOB: 06-17-1971  Phone: [phone] (home) [phone] (work)      All above HIPAA information was verified with patient.     Was a Nurse, learning disability used for this call? No    Completed refill call assessment today to schedule patient's medication shipment from the [facility] [phone]).  All relevant notes have been reviewed.     Specialty medication(s) and dose(s) confirmed: Regimen is correct and unchanged.   Changes to medications: Jaisha reports no changes at this time.  Changes to insurance: No  New side effects reported not previously addressed with a pharmacist or physician: None reported  Questions for the pharmacist: No    Confirmed patient received a Conservation officer, historic buildings and a Surveyor, mining with first shipment. The patient will receive a drug information handout for each medication shipped and additional FDA Medication Guides as required.       DISEASE/MEDICATION-SPECIFIC INFORMATION        N/A    SPECIALTY MEDICATION ADHERENCE     Medication Adherence    Patient reported X missed doses in the last month: 0  Specialty Medication: Nexavar 200mg   Patient is on additional specialty medications: No  Informant: patient              Were doses missed due to medication being on hold? No    Nexavar 200 mg: 14 days of medicine on hand       REFERRAL TO PHARMACIST     Referral to the pharmacist: Not needed      [facility]     Shipping address confirmed in Epic.     Delivery Scheduled: Yes, Expected medication delivery date: 10/29/21.     Medication will be delivered via UPS to the prescription address in Epic [place].    Heidi Woodard M Heidi Woodard   [facility] Specialty Technician

## 2021-10-15 NOTE — Unmapped (Signed)
error 

## 2021-10-16 MED ORDER — OXYCODONE 5 MG TABLET
ORAL_TABLET | Freq: Four times a day (QID) | ORAL | 0 refills | 25.00000 days | Status: CP | PRN
Start: 2021-10-16 — End: 2021-11-15

## 2021-10-16 MED ORDER — MORPHINE ER 15 MG TABLET,EXTENDED RELEASE
ORAL_TABLET | Freq: Every evening | ORAL | 0 refills | 30 days | Status: CP
Start: 2021-10-16 — End: ?

## 2021-10-16 NOTE — Unmapped (Signed)
This call has been handled in another encounter.

## 2021-10-16 NOTE — Unmapped (Signed)
.  Hi,    Patient Heidi Woodard called requesting a medication refill for the following:    ??? Medication: oxycodone/morphine  ??? Dosage:        5mg           15mg   ??? Days left of medication: 0  ??? [facility] [place]    She is calling in reference to getting these medications refilled asap.     Please contact the  patient asap so she can go and pick it up at [phone].    Thank you,  Noland Fordyce  [facility] Cancer Communication Center  [phone]

## 2021-10-16 NOTE — Unmapped (Signed)
Called pt to let her know refills have been sent in as requested.She will call back with any further needs.

## 2021-10-16 NOTE — Unmapped (Signed)
Request sent for pain med refill

## 2021-10-28 MED FILL — NEXAVAR 200 MG TABLET: ORAL | 30 days supply | Qty: 30 | Fill #8

## 2021-11-09 MED ORDER — OXYCODONE 5 MG TABLET
ORAL_TABLET | Freq: Four times a day (QID) | ORAL | 0 refills | 25 days | PRN
Start: 2021-11-09 — End: 2021-12-09

## 2021-11-09 MED ORDER — MORPHINE ER 15 MG TABLET,EXTENDED RELEASE
ORAL_TABLET | Freq: Every evening | ORAL | 0 refills | 30 days
Start: 2021-11-09 — End: ?

## 2021-11-09 NOTE — Unmapped (Signed)
Hi,    Patient Heidi Woodard called requesting a medication refill for the following:    ??? Medication: MORPhine  ??? Dosage: 15 MG  ??? Days left of medication: 5  ??? [facility] [name] [place]    ??? Medication: oxyCODONE  ??? Dosage: 5 mg  ??? Days left of medication: 5  ??? Pharmacy: [facility], [place]    The expected turnaround time is 3-4 business days     Thank you,  Yolanda Bonine  [facility] Cancer Communication Center  [phone]

## 2021-11-10 MED ORDER — OXYCODONE 5 MG TABLET
ORAL_TABLET | Freq: Four times a day (QID) | ORAL | 0 refills | 25 days | PRN
Start: 2021-11-10 — End: 2021-12-10

## 2021-11-10 MED ORDER — MORPHINE ER 15 MG TABLET,EXTENDED RELEASE
ORAL_TABLET | Freq: Every evening | ORAL | 0 refills | 30 days
Start: 2021-11-10 — End: ?

## 2021-11-11 MED ORDER — OXYCODONE 5 MG TABLET
ORAL_TABLET | Freq: Four times a day (QID) | ORAL | 0 refills | 25 days | Status: CP | PRN
Start: 2021-11-11 — End: 2021-12-11

## 2021-11-11 MED ORDER — MORPHINE ER 15 MG TABLET,EXTENDED RELEASE
ORAL_TABLET | Freq: Every evening | ORAL | 0 refills | 30 days | Status: CP
Start: 2021-11-11 — End: ?

## 2021-11-11 NOTE — Unmapped (Signed)
.  Hi,    Patient Heidi Woodard called requesting a medication refill for the following:    ??? Medication: Oxycodone/Morphine  ??? Dosage:       5mg             15mg   ??? Days left of medication: 1 week on both   ??? [facility] [place]     Please get this medication ready for the patient asap. She stated Dr. Meredith Mody said for her to call in prior to her running out get the medication refills.     Please contact patient back at [phone].    Thank you,  Noland Fordyce  [facility] Cancer Communication Center  [phone]

## 2021-11-12 NOTE — Unmapped (Signed)
Returned call to pt. Let her know refills where sent in as requested. She will call back with any further needs.

## 2021-11-14 ENCOUNTER — Ambulatory Visit
Admit: 2021-11-14 | Discharge: 2021-11-14 | Disposition: A | Discharge status: Home / Self Care | Payer: BLUE CROSS/BLUE SHIELD

## 2021-11-14 DIAGNOSIS — R112 Nausea with vomiting, unspecified: Principal | ICD-10-CM

## 2021-11-14 LAB — CBC W/ AUTO DIFF
BASOPHILS ABSOLUTE COUNT: 0 10*9/L (ref 0.0–0.2)
BASOPHILS RELATIVE PERCENT: 0.2 %
EOSINOPHILS ABSOLUTE COUNT: 0.2 10*9/L (ref 0.0–0.4)
EOSINOPHILS RELATIVE PERCENT: 1.7 %
HEMATOCRIT: 42.2 % (ref 34.0–44.0)
HEMOGLOBIN: 13.3 g/dL (ref 11.5–15.0)
LYMPHOCYTES ABSOLUTE COUNT: 0.8 10*9/L (ref 0.7–4.5)
LYMPHOCYTES RELATIVE PERCENT: 7.5 %
MEAN CORPUSCULAR HEMOGLOBIN CONC: 31.5 g/dL — ABNORMAL LOW (ref 32.0–36.0)
MEAN CORPUSCULAR HEMOGLOBIN: 27.4 pg (ref 27.0–34.0)
MEAN CORPUSCULAR VOLUME: 86.8 fL (ref 80.0–98.0)
MEAN PLATELET VOLUME: 9.7 fL (ref 7.4–10.4)
MONOCYTES ABSOLUTE COUNT: 0.4 10*9/L (ref 0.1–1.0)
MONOCYTES RELATIVE PERCENT: 4.1 %
NEUTROPHILS ABSOLUTE COUNT: 9.2 10*9/L — ABNORMAL HIGH (ref 1.8–7.8)
NEUTROPHILS RELATIVE PERCENT: 85.9 %
PLATELET COUNT: 330 10*9/L (ref 140–415)
RED BLOOD CELL COUNT: 4.86 10*12/L (ref 3.80–5.10)
RED CELL DISTRIBUTION WIDTH: 14.9 % — ABNORMAL HIGH (ref 11.5–14.5)
WBC ADJUSTED: 10.7 10*9/L — ABNORMAL HIGH (ref 4.0–10.5)

## 2021-11-14 LAB — COMPREHENSIVE METABOLIC PANEL
ALBUMIN: 3.2 g/dL — ABNORMAL LOW (ref 3.5–5.0)
ALKALINE PHOSPHATASE: 79 U/L (ref 46–116)
ALT (SGPT): 27 U/L (ref 12–78)
ANION GAP: 6 mmol/L (ref 3–11)
AST (SGOT): 26 U/L (ref 15–40)
BILIRUBIN TOTAL: 0.4 mg/dL (ref 0.3–1.2)
BLOOD UREA NITROGEN: 15 mg/dL (ref 8–20)
BUN / CREAT RATIO: 16
CALCIUM: 8.7 mg/dL (ref 8.5–10.1)
CHLORIDE: 104 mmol/L (ref 98–107)
CO2: 31 mmol/L (ref 21.0–32.0)
CREATININE: 0.94 mg/dL (ref 0.60–1.10)
EGFR CKD-EPI (2021) FEMALE: 73 mL/min/{1.73_m2} (ref >=60)
GLUCOSE RANDOM: 115 mg/dL (ref 70–179)
POTASSIUM: 3.2 mmol/L — ABNORMAL LOW (ref 3.5–5.0)
PROTEIN TOTAL: 7.9 g/dL (ref 6.0–8.0)
SODIUM: 141 mmol/L (ref 135–145)

## 2021-11-14 LAB — LIPASE: LIPASE: 50 U/L (ref 12–53)

## 2021-11-14 MED ORDER — ONDANSETRON 4 MG DISINTEGRATING TABLET
ORAL_TABLET | Freq: Three times a day (TID) | ORAL | 0 refills | 5.00000 days | Status: CP | PRN
Start: 2021-11-14 — End: 2021-11-21

## 2021-11-14 MED ADMIN — sodium chloride 0.9% (NS) bolus 1,000 mL: 1000 mL | INTRAVENOUS | @ 10:00:00 | Stop: 2021-11-14

## 2021-11-14 MED ADMIN — famotidine (PF) (PEPCID) injection 20 mg: 20 mg | INTRAVENOUS | @ 10:00:00 | Stop: 2021-11-14

## 2021-11-14 MED ADMIN — promethazine (PHENERGAN) injection 12.5 mg: 12.5 mg | INTRAMUSCULAR | @ 11:00:00 | Stop: 2021-11-14

## 2021-11-14 NOTE — Unmapped (Signed)
Vomiting since midnight. Diarrhea x1 episode PTA. Lower abdominal pain.

## 2021-11-14 NOTE — Unmapped (Incomplete)
Emergency Department Provider Note        ED Clinical Impression     Final diagnoses:   None       ED Assessment/Plan       Condition: {Blank single:19197::Expired,Critical,Guarded,Serious,Stable}  Disposition: {Blank single:19197::Observation,Left Against Medical Advice,Eloped,Transfer,Pending,Admit,To L&D,Discharge}    This chart has been completed using Engineer, civil (consulting) software, and while attempts have been made to ensure accuracy, certain words and phrases may not be transcribed as intended.     History     Chief Complaint   Patient presents with   ??? Emesis   ??? Abdominal Pain     HPI    Heidi Woodard is a 51 y.o. female  who presents today to the  emergency department complaining of nausea vomiting and diarrhea since 12 midnight or 4 to 5 hours..  Patient reports that she has been exposed to sick grandkids in the household.  No recent travel.  Has had some vaccinations against COVID.  No other known aggravating relieving factors.        Allergies: is allergic to augmentin [amoxicillin-pot clavulanate] and penicillins.  Medications: has a current medication list which includes the following long-term medication(s): lisinopril-hydrochlorothiazide.  PMHx:  has a past medical history of Coronary artery disease, Hypertension, and Neoplasm of shoulder.  PSHx:  has a past surgical history that includes Appendectomy.  SocHx:  reports that she has never smoked. She has never used smokeless tobacco. She reports that she does not drink alcohol and does not use drugs.  Allergies, Medications, Medical, Surgical, and Social History were reviewed as documented above.    Review Of Systems    Review of Systems    Physical Exam     BP 121/89  - Pulse 102  - Temp 36.7 ??C (98 ??F) (Oral)  - Resp 18  - Ht 160 cm (5' 3)  - Wt 99.8 kg (220 lb)  - SpO2 98%  - BMI 38.97 kg/m??     Physical Exam    ED Course   @MDM @     Procedures     Encounter Date: 11/14/21   ECG 12 lead (Adult)   Result Value EKG Systolic BP     EKG Diastolic BP     EKG Ventricular Rate 97    EKG Atrial Rate 97    EKG P-R Interval 176    EKG QRS Duration 78    EKG Q-T Interval 348    EKG QTC Calculation 441    EKG Calculated P Axis 60    EKG Calculated R Axis 24    EKG Calculated T Axis 45    QTC Fredericia 408    Narrative    Sinus rhythm with occasional premature ventricular complexes  Otherwise normal ECG  No previous ECGs available       @MDMCODING @

## 2021-11-17 NOTE — Unmapped (Signed)
Hi,    Patient Heidi Woodard called requesting a medication refill for the following:    ??? Medication: Oxycodone  ??? Dosage: 5mg   ??? Days left of medication: 0  ??? Pharmacy: [facility] [address] [place] - [address]   [address], [name] [address]   Phone:  [phone] ??Fax:  [phone]        The expected turnaround time is 3-4 business days       Check Indicates criteria has been reviewed and confirmed with the patient:    [x]  Preferred Name   [x]  DOB and/or MR#  [x]  Preferred Contact Method  [x]  Phone Number(s)   [x]  Preferred Pharmacy       Thank you,  Iona Hansen  [facility] Cancer Communication Center  [phone]

## 2021-11-18 NOTE — Unmapped (Signed)
Called pt to let her know MAP team is working on the PA for oxycodone.Will let her know when completed.She was appreciative of the call and will call back with any further needs.

## 2021-11-18 NOTE — Unmapped (Signed)
Hi,    Patient Heidi Woodard called requesting a medication refill for the following:    ??? Medication:Oxycodone  ??? Dosage:5 mg  ??? Days left of medication: 0  ??? Pharmacy: [facility]      The expected turnaround time is 3-4 business days       Check Indicates criteria has been reviewed and confirmed with the patient:    [x]  Preferred Name   [x]  DOB and/or MR#  [x]  Preferred Contact Method  [x]  Phone Number(s)   [x]  Preferred Pharmacy   []  MyChart     Thank you,  [name]  [facility] Communication Center  [phone]

## 2021-11-18 NOTE — Unmapped (Signed)
Hi,     Heidi Woodard  contacted the [facility] [facility] regarding the following:    - Heidi Woodard called and stated the Oxycodone 5 mg, may need new authorization.  Pharmacy [facility] [phone]) would not fill and told patient to call provider.     Please contact Heidi Woodard at [phone].    Thanks in advance,    Cipriano Mile  [facility] Cancer Communication Center   [phone]

## 2021-11-18 NOTE — Unmapped (Signed)
[facility] Refill Coordination Note    Specialty Medication(s) to be Shipped:   Hematology/Oncology: Nexavar    Other medication(s) to be shipped: No additional medications requested for fill at this time     Heidi Woodard, DOB: 1970/12/16  Phone: [phone] (home) [phone] (work)      All above HIPAA information was verified with patient.     Was a Nurse, learning disability used for this call? No    Completed refill call assessment today to schedule patient's medication shipment from the [facility] [phone]).  All relevant notes have been reviewed.     Specialty medication(s) and dose(s) confirmed: Regimen is correct and unchanged.   Changes to medications: Zeriyah reports no changes at this time.  Changes to insurance: No  New side effects reported not previously addressed with a pharmacist or physician: None reported  Questions for the pharmacist: No    Confirmed patient received a Conservation officer, historic buildings and a Surveyor, mining with first shipment. The patient will receive a drug information handout for each medication shipped and additional FDA Medication Guides as required.       DISEASE/MEDICATION-SPECIFIC INFORMATION        N/A    SPECIALTY MEDICATION ADHERENCE     Medication Adherence    Patient reported X missed doses in the last month: 0  Specialty Medication: Nexavar 200mg   Patient is on additional specialty medications: No  Informant: patient              Were doses missed due to medication being on hold? No    Nexavar  200 mg: 14 days of medicine on hand       REFERRAL TO PHARMACIST     Referral to the pharmacist: Not needed      [facility]     Shipping address confirmed in Epic.     Delivery Scheduled: Yes, Expected medication delivery date: 12/01/21.     Medication will be delivered via UPS to the prescription address in Epic [place].    [name]   [facility] Specialty Technician

## 2021-11-19 NOTE — Unmapped (Signed)
Clinical Pharmacist Practitioner: Documentation Note    Reason for Visit - Medication Access    Received a request that PA needed for oxycodone. Submitted on covermymeds.com with following key: [id number].    I spent 10 minutes in coordinating patient care.    Nicholos Johns, PharmD, CPP  Clinical Pharmacist Practitioner, Thoracic Oncology & Sarcoma  Pager: [phone]

## 2021-11-19 NOTE — Unmapped (Signed)
Reached out to MAT team member regarding script. [facility] was informed that [facility] not [facility] in [facility] and was set to wait until she received a call that it was available. Looking into other options for filling script.

## 2021-11-19 NOTE — Unmapped (Signed)
Hi,     Tisheena Maguire contacted the Communication Center requesting to speak with the care team of ADRIENNA KARIS to discuss:    [facility] [place] [facility] not have oxyCODONE 5 mg in stock. Pharmacy requested patient contact provider to send to another pharmacy. [facility] [place], [place].     Please contact Jennings Stirling at [phone].      Thank you,   Yolanda Bonine  [facility] Cancer Communication Center   [phone]

## 2021-11-20 MED ORDER — OXYCODONE 5 MG TABLET
ORAL_TABLET | Freq: Four times a day (QID) | ORAL | 0 refills | 25 days | Status: CP | PRN
Start: 2021-11-20 — End: 2021-12-20

## 2021-11-20 NOTE — Unmapped (Signed)
Returned call to pt. Let her know [facility]. She will call back with any further needs.

## 2021-11-27 ENCOUNTER — Other Ambulatory Visit: Admit: 2021-11-27 | Discharge: 2021-11-28 | Payer: BLUE CROSS/BLUE SHIELD

## 2021-11-27 ENCOUNTER — Ambulatory Visit: Admit: 2021-11-27 | Discharge: 2021-11-28 | Payer: BLUE CROSS/BLUE SHIELD

## 2021-11-27 DIAGNOSIS — D481 Neoplasm of uncertain behavior of connective and other soft tissue: Principal | ICD-10-CM

## 2021-11-27 DIAGNOSIS — G8929 Other chronic pain: Principal | ICD-10-CM

## 2021-11-27 DIAGNOSIS — L98491 Non-pressure chronic ulcer of skin of other sites limited to breakdown of skin: Principal | ICD-10-CM

## 2021-11-27 DIAGNOSIS — M25512 Pain in left shoulder: Principal | ICD-10-CM

## 2021-11-27 LAB — CBC W/ AUTO DIFF
BASOPHILS ABSOLUTE COUNT: 0 10*9/L (ref 0.0–0.1)
BASOPHILS RELATIVE PERCENT: 0.3 %
EOSINOPHILS ABSOLUTE COUNT: 0.3 10*9/L (ref 0.0–0.5)
EOSINOPHILS RELATIVE PERCENT: 4.1 %
HEMATOCRIT: 38.6 % (ref 34.0–44.0)
HEMOGLOBIN: 12.6 g/dL (ref 11.3–14.9)
LYMPHOCYTES ABSOLUTE COUNT: 3.7 10*9/L — ABNORMAL HIGH (ref 1.1–3.6)
LYMPHOCYTES RELATIVE PERCENT: 47.7 %
MEAN CORPUSCULAR HEMOGLOBIN CONC: 32.7 g/dL (ref 32.0–36.0)
MEAN CORPUSCULAR HEMOGLOBIN: 27.3 pg (ref 25.9–32.4)
MEAN CORPUSCULAR VOLUME: 83.4 fL (ref 77.6–95.7)
MEAN PLATELET VOLUME: 7.7 fL (ref 6.8–10.7)
MONOCYTES ABSOLUTE COUNT: 0.4 10*9/L (ref 0.3–0.8)
MONOCYTES RELATIVE PERCENT: 5.2 %
NEUTROPHILS ABSOLUTE COUNT: 3.3 10*9/L (ref 1.8–7.8)
NEUTROPHILS RELATIVE PERCENT: 42.7 %
PLATELET COUNT: 267 10*9/L (ref 150–450)
RED BLOOD CELL COUNT: 4.63 10*12/L (ref 3.95–5.13)
RED CELL DISTRIBUTION WIDTH: 15.9 % — ABNORMAL HIGH (ref 12.2–15.2)
WBC ADJUSTED: 7.8 10*9/L (ref 3.6–11.2)

## 2021-11-27 LAB — COMPREHENSIVE METABOLIC PANEL
ALBUMIN: 3.7 g/dL (ref 3.4–5.0)
ALKALINE PHOSPHATASE: 79 U/L (ref 46–116)
ALT (SGPT): 27 U/L (ref 10–49)
ANION GAP: 8 mmol/L (ref 5–14)
AST (SGOT): 35 U/L — ABNORMAL HIGH (ref <=34)
BILIRUBIN TOTAL: 0.5 mg/dL (ref 0.3–1.2)
BLOOD UREA NITROGEN: 11 mg/dL (ref 9–23)
BUN / CREAT RATIO: 13
CALCIUM: 9.7 mg/dL (ref 8.7–10.4)
CHLORIDE: 101 mmol/L (ref 98–107)
CO2: 32 mmol/L — ABNORMAL HIGH (ref 20.0–31.0)
CREATININE: 0.85 mg/dL — ABNORMAL HIGH
EGFR CKD-EPI (2021) FEMALE: 83 mL/min/{1.73_m2} (ref >=60)
GLUCOSE RANDOM: 97 mg/dL (ref 70–179)
POTASSIUM: 3.8 mmol/L (ref 3.4–4.8)
PROTEIN TOTAL: 7.9 g/dL (ref 5.7–8.2)
SODIUM: 141 mmol/L (ref 135–145)

## 2021-11-27 LAB — TSH: THYROID STIMULATING HORMONE: 3.322 u[IU]/mL (ref 0.550–4.780)

## 2021-11-27 NOTE — Unmapped (Signed)
[facility] BONE AND SOFT TISSUE ONCOLOGY RETURN VISIT    Encounter Date: 11/27/2021  Patient Name: Heidi Woodard  Medical Record Number: [id number]    Referring Physician: Sheral Apley, MD  [address]  [facility] [address] [place],  [place] [address]    Primary Care Provider: [facility] Department    DIAGNOSIS:  1. Desmoid fibromatosis    2. Callous ulcer, limited to breakdown of skin (CMS-HCC)    3. 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 Continue prn topical steroids  - Continue urea cream on callouses on feet and also along neck.   - MRI Left shoulder, 02/08/20, similar to decreased bulk of shoulder mass  - 05/20/20: held sorafenib in anticipation of TKR on 05/26/20  - 08/04/20: MRI Left shoulder, similar size but increase in pain/swelling. Restarted sorafenib 200 mg daily.    - 12/12/20: Continues on sorafenib 200 mg daily, doing well. Improvement in desmoid associated pain, manageable with low dose opioids. HFS, HTN, nausea under control.  -02/13/21: Pain progressively worse over past few months, requiring increasing doses of opioids. MRI is stable/slightly better. However, given her pain, referred for cryoablation, Dr. Cathie Hoops feels it is amenable.  -05/29/21: Seen in follow up. Tolerating sorafenib well. Pain is stable on opioid regimen.  -08/28/21: MRI shows essentially stable disease although minimal growth. Pain and range of motion are stable today. Continue stable dose of sorafenib. Will ask Dr. Cathie Hoops to review MRI if pain worsens.    -11/27/21: Doing okay today. Pain largely stable. Labs good, continue sorafenib 200mg . Reviewed history, 400mg  dose not tolerated well.  ??  -Possible future options if progression: doxil, sorafenib dose escalation (likely will not be well tolerated), pazopanib, nirogestat.  ??  Essential HTN - likely exacerbated by sorafenib  - asked patient to get BP cuff and call if BP continues >140/90  - continue hydrochlorothiazide/lisinopril  ??  Desmoid tumor associated pain - improved with sorafenib, then a bit worse. Had follow up visit in May for discussion about safe prescribing, adherence to dose as prescribed, inability to do any more early refills, as she had been requesting several early refills due to escalating pain. Now stable for several months, adherent to pain management agreement.  ??  Nighttime MS contin 15mg  (#30 per month)??  Oxycodone 5mg  Q4h prn, max 3 pills daily (#100 monthly)  ??  No early refills moving forward without clear discussion of the situation.  ??  HFS, mild   - continue urea 10-20% cream - escalate to TID given calluses on hands  - continue aggressive emollient use to prevent dry skin  ??  Birth control: Says she is not sexually active. Aware of teratogenic risk of pregnancy on sorafenib.  ??  Dispo:  F/U 3 mos with MRI    I personally reviewed the medical records, pathology and laboratory results and viewed the imaging. All questions were answered to the patient's apparent satisfaction and they voiced understanding and agreement with the plan. Pt has my card and contact information and is encouraged to reach out with any further questions.    Donzetta Sprung, MD/MPH  Assistant Professor  Bone and Soft Tissue Oncology Program  [facility]  Pager: [phone]  Nurse  Navigator: [name] RN, BSN, OCN      REASON FOR CONSULTATION:   Heidi Woodard is a 51 y.o. female who is seen in consultation at the request of Sheral Apley, MD for evaluation of her desmoid fibromatosis.      HISTORY OF PRESENT ILLNESS:      In 2016, pt developed shoulder pain associated with a fall. There was bruising and swelling but she attributed it to the injury. It never improved, and by summer she noted increased swelling and decreased ROM (which is limited by pain, rather than painless weakness). 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 She underwent CT of the left shoudler on 06/25/16 which showed a 16 cm mass involing the left deltoid with likely invasion of the left trapezius, triceps, and teres minor. Possible LN involvement of axillary and supraclav LN on left were present. On 07/07/2016 she saw Dr. Darral Dash and ultrasound-guided biopsy was performed with pathology revealing low-grade spindle cell lesion favoring fibromatosis. He felt that surgical resection would be highly morbid. On 9/20/017, she saw Dr Beckey Rutter, who did not favor RT due to inherent risks associated with RT.   ??  She has ongoing pain, some tingling along the backs of her hands. Pain is intermittent if her arm is at rest, happening about every other day but when it happens it is very intense. Per above it limits her ADLs although she says she is managing.  ??  Interval history:   Doing okay. Fatigue prominent, not every day but sometimes needs a nap after doing daily activities. Nausea requiring zofran maybe once a week. Shoulder pain persistent, sometimes better sometimes worse. Wonders if it is a bit tighter lately.    REVIEW OF SYSTEMS:  A comprehensive review of 12 systems was negative except for pertinent positives noted in HPI.    Past Medical History, Surgical History, Family History were reviewed personally. Any changes were updated as above.    Social History:  Lives in [place], [place]  Has a supportive boyfriend  No t/e/d  89 year old daughter    Pregnancy status: perimenopausal, not sexually active      ALLERGIES/MEDICATIONS:  Reviewed in Epic    PHYSICAL EXAM:   BP 141/90  - Pulse 70  - Temp 36.4 ??C (97.6 ??F) (Oral)  - Resp 16  - Ht 157.5 cm (5' 2)  - Wt (!) 105.5 kg (232 lb 9.6 oz)  - SpO2 99%  - BMI 42.54 kg/m??   ECOG 0  Gen - well appearing, in no acute distress  Eyes - conjunctivae clear, PERRL  ENT - mask in place  Lymph - no cervical or supraclavicular lymphadenopathy appreciated  Resp - clear to auscultation bilaterally, no wheezes, crackles or rales  CV - normal rate, regular rhythm, no murmur appreciated, no lower extremity edema noted  GI - abdomen soft, nontender, nondistended, no masses, bowel sounds normal  Skin - no rashes, no lesions noted  Neuro - AOx3, EOM intact, no facial droop, speech fluent and coherent, moves all extremities without asymmetry  Psych - affect normal, mood okay  MSK - left shoulder mass, ill defined but palpable, minimal tenderness.     PATHOLOGY:   Pathology was personally reviewed as described in the HPI, detailed in Epic.     LABS:  Reviewed in Epic    RADIOLOGY:  Imaging was personally viewed and interpreted as summarized  in the history (see above).

## 2021-11-27 NOTE — Unmapped (Signed)
It was a pleasure to see you in clinic today.    For appointments & questions Monday through Friday 8 AM--5 PM     Please call [phone] or Toll free [phone]    For urgent clinical needs on Nights, Weekends or Holidays:  Call [phone] and ask for the oncologist on call.     For appointment changes please contact during normal business hours.     Please visit [email or web address], a resource created just for family members and caregivers.  This website lists support services, how and where to ask for help. It has tools to assist you as you help us care for your loved one.    Maurisha Mongeau, MD, MPH  Assistant Professor  Bone and Soft Tissue Oncology Program  Nurse Navigator: Stephanie Shea    [facility]  [address]  [place], [place] [address]  [email or web address]

## 2021-11-30 MED FILL — NEXAVAR 200 MG TABLET: ORAL | 30 days supply | Qty: 30 | Fill #9

## 2021-12-05 NOTE — Unmapped (Signed)
Emergency Department Provider Note        ED Clinical Impression     Final diagnoses:   Nausea and vomiting, unspecified vomiting type (Primary)   Gastroenteritis       ED Assessment/Plan       Condition: Stable  Disposition: Discharge    This chart has been completed using Dragon Medical Dictation software, and while attempts have been made to ensure accuracy, certain words and phrases may not be transcribed as intended.     History     Chief Complaint   Patient presents with   ??? Emesis   ??? Abdominal Pain     HPI    Heidi Woodard is a 51 y.o. female  who presents today to the  emergency department complaining of nausea vomiting and abdominal pain.  Patient exposed to sick grandkids with similar gastroenteritis symptoms.  No abdominal trauma.  No other known aggravating relieving factors.  No recent surgeries.  No recent antibiotics.  No burning with urination.        Allergies: is allergic to augmentin [amoxicillin-pot clavulanate] and penicillins.  Medications: has a current medication list which includes the following long-term medication(s): lisinopril-hydrochlorothiazide.  PMHx:  has a past medical history of Coronary artery disease, Hypertension, and Neoplasm of shoulder.  PSHx:  has a past surgical history that includes Appendectomy.  SocHx:  reports that she has never smoked. She has never used smokeless tobacco. She reports that she does not drink alcohol and does not use drugs.  Allergies, Medications, Medical, Surgical, and Social History were reviewed as documented above.    Social Determinants of Health with Concerns     Food Insecurity: Not on file   Transportation Needs: Not on file   Alcohol Use: Not on file   Housing/Utilities: Not on file   Substance Use: Not on file   Financial Resource Strain: Not on file   Physical Activity: Not on file   Health Literacy: Not on file   Stress: Not on file   Intimate Partner Violence: Not on file   Depression: Not on file   Social Connections: Not on file Review Of Systems    Review of Systems   Constitutional: Negative for chills and fever.   HENT: Negative for ear pain.    Respiratory: Negative for chest tightness and shortness of breath.    Cardiovascular: Negative for chest pain.   Gastrointestinal: Negative for abdominal pain.   Genitourinary: Negative for flank pain.   Musculoskeletal: Negative for back pain.   Skin: Negative for rash and wound.   Neurological: Negative for weakness.   Psychiatric/Behavioral: Negative for suicidal ideas.   All other systems reviewed and are negative.      Physical Exam     BP 119/98  - Pulse 88  - Temp 36.7 ??C (98.1 ??F) (Oral)  - Resp 29  - Ht 160 cm (5' 3)  - Wt 99.8 kg (220 lb)  - SpO2 97%  - BMI 38.97 kg/m??     Physical Exam  Vitals and nursing note reviewed.   Constitutional:       Appearance: Normal appearance. She is well-developed. She is not ill-appearing, toxic-appearing or diaphoretic.   HENT:      Head: Normocephalic.      Nose: Nose normal.      Mouth/Throat:      Mouth: Mucous membranes are moist.   Eyes:      Conjunctiva/sclera: Conjunctivae normal.   Cardiovascular:  Rate and Rhythm: Normal rate and regular rhythm.      Pulses: Normal pulses.      Heart sounds: Normal heart sounds.   Pulmonary:      Effort: Pulmonary effort is normal.      Breath sounds: Normal breath sounds.   Abdominal:      General: Bowel sounds are normal. There is no distension.      Palpations: Abdomen is soft.      Tenderness: There is no abdominal tenderness. There is no right CVA tenderness or left CVA tenderness.      Comments: Patient improved after IV medications here.  Ready for discharge home at the time of   Musculoskeletal:         General: No deformity.      Cervical back: Neck supple.   Skin:     General: Skin is warm and dry.      Capillary Refill: Capillary refill takes less than 2 seconds.   Neurological:      General: No focal deficit present.      Mental Status: She is alert. Mental status is at baseline. Psychiatric:         Mood and Affect: Mood normal.         Behavior: Behavior normal.         Thought Content: Thought content normal.         Judgment: Judgment normal.         ED Course   Medical Decision Making  .  Differential diagnosis does include gastritis, cholecystitis, appendicitis, electrolyte abnormality, AMI, ACS, arrhythmia, sepsis, UTI, pyelonephritis among others    Patient improved with IV fluids antiemetics labs gross within normal limits tolerating p.o. will discharge home         Procedures     Encounter Date: 11/14/21   ECG 12 lead (Adult)   Result Value    EKG Systolic BP     EKG Diastolic BP     EKG Ventricular Rate 97    EKG Atrial Rate 97    EKG P-R Interval 176    EKG QRS Duration 78    EKG Q-T Interval 348    EKG QTC Calculation 441    EKG Calculated P Axis 60    EKG Calculated R Axis 24    EKG Calculated T Axis 45    QTC Fredericia 408    Narrative    Sinus rhythm with occasional premature ventricular complexes  Otherwise normal ECG  No previous ECGs available                Linnell Fulling, MD  12/04/21 1903

## 2021-12-14 MED ORDER — MORPHINE ER 15 MG TABLET,EXTENDED RELEASE
ORAL_TABLET | Freq: Every evening | ORAL | 0 refills | 30 days | Status: CP
Start: 2021-12-14 — End: ?

## 2021-12-14 MED ORDER — OXYCODONE 5 MG TABLET
ORAL_TABLET | Freq: Four times a day (QID) | ORAL | 0 refills | 25 days | Status: CP | PRN
Start: 2021-12-14 — End: 2022-01-13

## 2021-12-14 NOTE — Unmapped (Signed)
Returned Call    after receiving call from the communication center in reference to Morphine refill and oxycodone refills. She has 3 left of the Morphine and 20 left of the Oxycodone     Triage Recommendations:   I will relay the medication refill request to Dr. Meredith Mody.      Caller's Response: Verbalized understanding of information discussed and agrees with plan.      Outstanding tasks: Care team notified, no further action needed

## 2021-12-14 NOTE — Unmapped (Signed)
Hi,    Patient Heidi Woodard called requesting a medication refill for the following:    ??? Medication: Roxicodone  ??? Dosage: 5mg   ??? Days left of medication: 0  ??? [facility]    ??? Medication: Morphine  ??? Dosage: 15mg   ??? Days left of medication: 0  ??? Pharmacy: [facility]      The expected turnaround time is 3-4 business days       Check Indicates criteria has been reviewed and confirmed with the patient:    [x]  Preferred Name   [x]  DOB and/or MR#  [x]  Preferred Contact Method  [x]  Phone Number(s)   [x]  Preferred Pharmacy   []  MyChart     Thank you,  [name]  [facility]  [phone]

## 2021-12-23 NOTE — Unmapped (Signed)
[facility] Clinical Assessment & Refill Coordination Note    Heidi Woodard, DOB: August 17, 1971  Phone: [phone] (home) [phone] (work)    All above HIPAA information was verified with patient.     Was a Nurse, learning disability used for this call? No    Specialty Medication(s):   Hematology/Oncology: Nexavar     Current Outpatient Medications   Medication Sig Dispense Refill   ??? celecoxib (CELEBREX) 200 MG capsule Take 200 mg by mouth daily.     ??? cyclobenzaprine (FLEXERIL) 10 MG tablet Take 10 mg by mouth daily.     ??? lisinopril-hydrochlorothiazide (PRINZIDE,ZESTORETIC) 20-25 mg per tablet Take 1 tablet by mouth daily.     ??? MORPhine (MS CONTIN) 15 MG 12 hr tablet Take 1 tablet (15 mg total) by mouth at bedtime. 30 tablet 0   ??? ondansetron (ZOFRAN) 8 MG tablet Take 1 tablet (8 mg total) by mouth every eight (8) hours as needed for nausea. 30 tablet 3   ??? oxyCODONE (ROXICODONE) 5 MG immediate release tablet Take 1 tablet (5 mg total) by mouth every six (6) hours as needed for pain (moderate to severe pain). 100 tablet 0   ??? potassium gluconate 2.5 mEq Tab Take 1 tablet by mouth.     ??? simvastatin (ZOCOR) 40 MG tablet Take 40 mg by mouth nightly.     ??? SORAfenib (NEXAVAR) 200 mg tablet Take 1 tablet (200 mg total) by mouth daily. 30 tablet 11   ??? therapeutic multivitamin (THERAGRAN) tablet Take 1 tablet by mouth daily.     ??? urea (CARMOL) 10 % cream APPLY NIGHTLY TO AFFECTED PRESSURE CALLOUS ON FOOT 85 g 5   ??? urea-alpha hydroxy acids 10-4 % Crea APPLY NIGHTLY TO AFFECTED PRESSURE CALLOUS ON FOOT       No current facility-administered medications for this visit.        Changes to medications: Breyanna reports no changes at this time.    Allergies   Allergen Reactions   ??? Augmentin [Amoxicillin-Pot Clavulanate] Hives   ??? Penicillins Hives       Changes to allergies: No    SPECIALTY MEDICATION ADHERENCE     Nexavar 200 mg: 11 days of medicine on hand     Medication Adherence    Patient reported X missed doses in the last month: 0  Specialty Medication: sorafenib 200 mg once daily  Patient is on additional specialty medications: No  Informant: patient          Specialty medication(s) dose(s) confirmed: Regimen is correct and unchanged.     Are there any concerns with adherence? No    Adherence counseling provided? Not needed    CLINICAL MANAGEMENT AND INTERVENTION      Clinical Benefit Assessment:    Do you feel the medicine is effective or helping your condition? Yes    Clinical Benefit counseling provided? Not needed    Adverse Effects Assessment:    Are you experiencing any side effects? No    Are you experiencing difficulty administering your medicine? No    Quality of Life Assessment:    Quality of Life    Rheumatology  Oncology  Dermatology  Cystic Fibrosis          How many days over the past month did your Desmoid fibromatosis  keep you from your normal activities? For example, brushing your teeth or getting up in the morning. 0    Have you discussed this with your provider? Not  needed    Acute Infection Status:    Acute infections noted within Epic:  No active infections  Patient reported infection: None    Therapy Appropriateness:    Is therapy appropriate and patient progressing towards therapeutic goals? Yes, therapy is appropriate and should be continued    DISEASE/MEDICATION-SPECIFIC INFORMATION      N/A    PATIENT SPECIFIC NEEDS     - Does the patient have any physical, cognitive, or cultural barriers? No    - Is the patient high risk? Yes, patient is taking oral chemotherapy. Appropriateness of therapy as been assessed    - Does the patient require a Care Management Plan? No     SOCIAL DETERMINANTS OF HEALTH     At the [facility], we have learned that life circumstances - like trouble affording food, housing, utilities, or transportation can affect the health of many of our patients.   That is why we wanted to ask: are you currently experiencing any life circumstances that are negatively impacting your health and/or quality of life? No    Social Determinants of Health     Food Insecurity: Not on file   Tobacco Use: Low Risk    ??? Smoking Tobacco Use: Never   ??? Smokeless Tobacco Use: Never   ??? Passive Exposure: Not on file   Transportation Needs: Not on file   Alcohol Use: Not on file   Housing/Utilities: Not on file   Substance Use: Not on file   Financial Resource Strain: Not on file   Physical Activity: Not on file   Health Literacy: Not on file   Stress: Not on file   Intimate Partner Violence: Not on file   Depression: Not on file   Social Connections: Not on file       Would you be willing to receive help with any of the needs that you have identified today? Not applicable       SHIPPING     Specialty Medication(s) to be Shipped:   Hematology/Oncology: Nexavar    Other medication(s) to be shipped: No additional medications requested for fill at this time     Changes to insurance: No    Delivery Scheduled: Yes, Expected medication delivery date: 12/29/21.     Medication will be delivered via UPS to the confirmed prescription address in [facility].    The patient will receive a drug information handout for each medication shipped and additional FDA Medication Guides as required.  Verified that patient has previously received a Conservation officer, historic buildings and a Surveyor, mining.    The patient or caregiver noted above participated in the development of this care plan and knows that they can request review of or adjustments to the care plan at any time.      All of the patient's questions and concerns have been addressed.    Breck Coons Shared [facility] Specialty Pharmacist

## 2021-12-28 MED FILL — NEXAVAR 200 MG TABLET: ORAL | 30 days supply | Qty: 30 | Fill #10

## 2022-01-04 MED ORDER — OXYCODONE 5 MG TABLET
ORAL_TABLET | Freq: Four times a day (QID) | ORAL | 0 refills | 25 days | Status: CP | PRN
Start: 2022-01-04 — End: 2022-02-03

## 2022-01-04 MED ORDER — MORPHINE ER 15 MG TABLET,EXTENDED RELEASE
ORAL_TABLET | Freq: Every evening | ORAL | 0 refills | 30 days | Status: CP
Start: 2022-01-04 — End: ?

## 2022-01-04 NOTE — Unmapped (Signed)
Hi,    Patient Heidi Woodard called requesting a medication refill for the following:    ??? Medication: Morphine  ??? Dosage: 15mg   ??? Days left of medication: 8 pills left  ??? Pharmacy: [facility] [phone]    ??? Medication: Oxycodone  ??? Dosage: 5mg   ??? Days left of medication: 25 pills left  ??? Pharmacy: [id number]        Thank you,  Durward Fortes  [facility]  [phone]

## 2022-01-05 NOTE — Unmapped (Signed)
Returned call to [name]. Let her know scripts were sent in as requested. She will call back with any further needs.

## 2022-01-11 DIAGNOSIS — G893 Neoplasm related pain (acute) (chronic): Principal | ICD-10-CM

## 2022-01-11 NOTE — Unmapped (Signed)
Hi,     Brenee Gajda contacted the Communication Center regarding the following:    - States pharmacy has not received prescription for Morphine 15 mg.    Please contact Hollynn Garno at [phone].    Thanks in advance,    Yolanda Bonine  [facility] Communication [facility]   [phone]

## 2022-01-11 NOTE — Unmapped (Signed)
Returned call to pt. Let her know I spoke with Heidi Woodard and morphine is needing a PA  Done. Will send a message to MAP team so this can be completed. Will let her know when it is completed. She is agreeable to this plan and will call back with any further needs.

## 2022-01-13 NOTE — Unmapped (Signed)
Hi,    Patient Heidi Woodard called requesting a medication refill for the following:    ??? Medication: Morphine  ??? Dosage: 15mg   ??? Days left of medication: 0  ??? [facility]      The expected turnaround time is 3-4 business days       Check Indicates criteria has been reviewed and confirmed with the patient:    [x]  Preferred Name   [x]  DOB and/or MR#  [x]  Preferred Contact Method  [x]  Phone Number(s)   [x]  Preferred Pharmacy   []  MyChart     Thank you,  [name]  [facility] Communication Center  [phone]

## 2022-01-19 NOTE — Unmapped (Signed)
[facility] Refill Coordination Note    Specialty Medication(s) to be Shipped:   Hematology/Oncology: Nexavar    Other medication(s) to be shipped: No additional medications requested for fill at this time     Heidi Woodard, DOB: February 10, 1971  Phone: [phone] (home) [phone] (work)      All above HIPAA information was verified with patient.     Was a Nurse, learning disability used for this call? No    Completed refill call assessment today to schedule patient's medication shipment from the [facility] [phone]).  All relevant notes have been reviewed.     Specialty medication(s) and dose(s) confirmed: Regimen is correct and unchanged.   Changes to medications: [name] reports no changes at this time.  Changes to insurance: No  New side effects reported not previously addressed with a pharmacist or physician: None reported  Questions for the pharmacist: No    Confirmed patient received a Conservation officer, historic buildings and a Surveyor, mining with first shipment. The patient will receive a drug information handout for each medication shipped and additional FDA Medication Guides as required.       DISEASE/MEDICATION-SPECIFIC INFORMATION        N/A    SPECIALTY MEDICATION ADHERENCE     Medication Adherence    Patient reported X missed doses in the last month: 0  Specialty Medication: Nexavar 200 mg  Patient is on additional specialty medications: No  Informant: patient              Were doses missed due to medication being on hold? No    Nexavar  200 mg: 14 days of medicine on hand       REFERRAL TO PHARMACIST     Referral to the pharmacist: Not needed      [facility]     Shipping address confirmed in Epic.     Delivery Scheduled: Yes, Expected medication delivery date: 01/29/22.     Medication will be delivered via UPS to the prescription address in Epic [place].    Heidi Woodard   [facility] Specialty Technician

## 2022-01-28 MED FILL — NEXAVAR 200 MG TABLET: ORAL | 30 days supply | Qty: 30 | Fill #11

## 2022-02-01 MED ORDER — OXYCODONE 5 MG TABLET
ORAL_TABLET | Freq: Four times a day (QID) | ORAL | 0 refills | 25 days | PRN
Start: 2022-02-01 — End: 2022-03-03

## 2022-02-01 NOTE — Unmapped (Signed)
I attempted to call patient back to let her know that I would send this request over to Dr. Meredith Mody.  I left a message stating that I would let Dr. Meredith Mody know about the request and if appropriate he would refill it.  I advised her to call us back in 2-3 days if she has not heard back from her pharmacy.

## 2022-02-01 NOTE — Unmapped (Signed)
Hi,    Patient Heidi Woodard called requesting a medication refill for the following:    ??? Medication: oxyCODONE (ROXICODONE)   ??? Dosage: 5 MG immediate release tablet   ??? Days left of medication: 5  ??? Pharmacy: [facility]   [address] [place], [place] [address]   Ph [phone]          The expected turnaround time is 3-4 business days           Thank you,  Warnell Bureau  [facility] Cancer Communication Center  [phone]

## 2022-02-03 MED ORDER — OXYCODONE 5 MG TABLET
ORAL_TABLET | Freq: Four times a day (QID) | ORAL | 0 refills | 25 days | Status: CP | PRN
Start: 2022-02-03 — End: 2022-03-05

## 2022-02-03 NOTE — Unmapped (Signed)
Called the patient back today to let her know that Dr. Meredith Mody had refilled the medication. She is aware and appreciative of the call.     No further needs at the present.

## 2022-02-11 MED ORDER — MORPHINE ER 15 MG TABLET,EXTENDED RELEASE
ORAL_TABLET | Freq: Every evening | ORAL | 0 refills | 30 days | Status: CP
Start: 2022-02-11 — End: ?

## 2022-02-11 NOTE — Unmapped (Signed)
Hi,    Patient Heidi Woodard called requesting a medication refill for the following:    Medication: Morphine  Dosage: 15mg   Days left of medication: 0  Pharmacy: [facility] [address], [place] - [address]  [address], [place] [address]  Phone: [phone]  Fax: [phone]     The expected turnaround time is 3-4 business days       Check Indicates criteria has been reviewed and confirmed with the patient:    [x]  Preferred Name   [x]  DOB and/or MR#  [x]  Preferred Contact Method  [x]  Phone Number(s)   [x]  Preferred Pharmacy       Thank you,  Iona Hansen  [facility] Cancer Communication Center  [phone]

## 2022-02-11 NOTE — Unmapped (Signed)
Attempted to return call to pt. Left V/M that her refill has been sent in as requested.Asked her to call back with any further needs.

## 2022-02-19 DIAGNOSIS — D481 Neoplasm of uncertain behavior of connective and other soft tissue: Principal | ICD-10-CM

## 2022-02-19 MED ORDER — SORAFENIB 200 MG TABLET
ORAL_TABLET | Freq: Every day | ORAL | 11 refills | 30 days | Status: CP
Start: 2022-02-19 — End: 2023-02-19
  Filled 2022-03-04: qty 30, 30d supply, fill #0

## 2022-02-22 NOTE — Unmapped (Signed)
[facility] Refill Coordination Note    Specialty Medication(s) to be Shipped:   Hematology/Oncology: Nexavar    Other medication(s) to be shipped: No additional medications requested for fill at this time     Heidi Woodard, DOB: 10-03-1971  Phone: [phone] (home) [phone] (work)      All above HIPAA information was verified with patient.     Was a Nurse, learning disability used for this call? No    Completed refill call assessment today to schedule patient's medication shipment from the [facility] [phone]).  All relevant notes have been reviewed.     Specialty medication(s) and dose(s) confirmed: Regimen is correct and unchanged.   Changes to medications: [name] reports no changes at this time.  Changes to insurance: No  New side effects reported not previously addressed with a pharmacist or physician: None reported  Questions for the pharmacist: No    Confirmed patient received a Conservation officer, historic buildings and a Surveyor, mining with first shipment. The patient will receive a drug information handout for each medication shipped and additional FDA Medication Guides as required.       DISEASE/MEDICATION-SPECIFIC INFORMATION        N/A    SPECIALTY MEDICATION ADHERENCE     Medication Adherence    Patient reported X missed doses in the last month: 0  Specialty Medication: Nexavar 200 mg  Patient is on additional specialty medications: No  Informant: patient              Were doses missed due to medication being on hold? No    Nexavar  200 mg: 14 days of medicine on hand       REFERRAL TO PHARMACIST     Referral to the pharmacist: Not needed      [facility]     Shipping address confirmed in Epic.     Delivery Scheduled: Yes, Expected medication delivery date: 03/05/22.     Medication will be delivered via UPS to the prescription address in Epic [place].    Heidi Woodard   [facility] Specialty Technician

## 2022-02-23 NOTE — Unmapped (Unsigned)
[facility] BONE AND SOFT TISSUE ONCOLOGY RETURN VISIT    Encounter Date: 02/26/2022  Patient Name: Heidi Woodard  Medical Record Number: [id number]    Referring Physician: Sheral Apley, MD  [address]  [facility] [address] [place],  [place] [address]    Primary Care Provider: [facility]    DIAGNOSIS:  No diagnosis found.      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 Continue prn topical steroids  - Continue urea cream on callouses on feet and also along neck.   - MRI Left shoulder, 02/08/20, similar to decreased bulk of shoulder mass  - 05/20/20: held sorafenib in anticipation of TKR on 05/26/20  - 08/04/20: MRI Left shoulder, similar size but increase in pain/swelling. Restarted sorafenib 200 mg daily.    - 12/12/20: Continues on sorafenib 200 mg daily, doing well. Improvement in desmoid associated pain, manageable with low dose opioids. HFS, HTN, nausea under control.  -02/13/21: Pain progressively worse over past few months, requiring increasing doses of opioids. MRI is stable/slightly better. However, given her pain, referred for cryoablation, Dr. Cathie Hoops feels it is amenable.  -05/29/21: Seen in follow up. Tolerating sorafenib well. Pain is stable on opioid regimen.  -08/28/21: MRI shows essentially stable disease although minimal growth. Pain and range of motion are stable today. Continue stable dose of sorafenib. Will ask Dr. Cathie Hoops to review MRI if pain worsens.    -11/27/21: Doing okay today. Pain largely stable. Labs good, continue sorafenib 200mg . Reviewed history, 400mg  dose not tolerated well.    02/26/22: Scans personally reviewed, interpreted and discussed with the patient, which show ***     -Possible future options if progression: doxil, sorafenib dose escalation (likely will not be well tolerated), pazopanib, nirogestat. Essential HTN - likely exacerbated by sorafenib  - asked patient to get BP cuff and call if BP continues >140/90  - continue hydrochlorothiazide/lisinopril     Desmoid tumor associated pain - improved with sorafenib, then a bit worse. Had follow up visit in May for discussion about safe prescribing, adherence to dose as prescribed, inability to do any more early refills, as she had been requesting several early refills due to escalating pain. Now stable for several months, adherent to pain management agreement.     Nighttime MS contin 15mg  (#30 per month)   Oxycodone 5mg  Q4h prn, max 3 pills daily (#100 monthly)     No early refills moving forward without clear discussion of the situation.     HFS, mild   - continue urea 10-20% cream - escalate to TID given calluses on hands  - continue aggressive emollient use to prevent dry skin     Birth control: Says she is not sexually active. Aware of teratogenic risk of pregnancy on sorafenib.     Dispo:  RTC 3 mos without scans, 6 mos with MRI    I personally reviewed the medical records, pathology and laboratory results and viewed the imaging. All questions were answered to the patient's apparent satisfaction and they voiced understanding and agreement with the plan. Pt has my card and contact information and is encouraged to reach out with any further questions.    Donzetta Sprung, MD/MPH  Assistant Professor  Bone and Soft Tissue Oncology Program  [facility]  Pager:  [phone]  Nurse Navigator: [name] RN, BSN, OCN      REASON FOR CONSULTATION:   Heidi Woodard is a 51 y.o. female who is seen in consultation at the request of Sheral Apley, MD for evaluation of her desmoid fibromatosis.      HISTORY OF PRESENT ILLNESS:      In 2016, pt developed shoulder pain associated with a fall. There was bruising and swelling but she attributed it to the injury. It never improved, and by summer she noted increased swelling and decreased ROM (which is limited by pain, rather than painless weakness). 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 She underwent CT of the left shoudler on 06/25/16 which showed a 16 cm mass involing the left deltoid with likely invasion of the left trapezius, triceps, and teres minor. Possible LN involvement of axillary and supraclav LN on left were present. On 07/07/2016 she saw Dr. Darral Dash and ultrasound-guided biopsy was performed with pathology revealing low-grade spindle cell lesion favoring fibromatosis. He felt that surgical resection would be highly morbid. On 9/20/017, she saw Dr Beckey Rutter, who did not favor RT due to inherent risks associated with RT.      She has ongoing pain, some tingling along the backs of her hands. Pain is intermittent if her arm is at rest, happening about every other day but when it happens it is very intense. Per above it limits her ADLs although she says she is managing.     Interval history:   ***    REVIEW OF SYSTEMS:  A comprehensive review of 12 systems was negative except for pertinent positives noted in HPI.    Past Medical History, Surgical History, Family History were reviewed personally. Any changes were updated as above.    Social History:  Lives in [place], [place]  Has a supportive boyfriend  No t/e/d  41 year old daughter    Pregnancy status: perimenopausal, not sexually active    ALLERGIES/MEDICATIONS:  Reviewed in Epic    PHYSICAL EXAM:   There were no vitals taken for this visit.  ECOG 0  Gen - well appearing, in no acute distress  Eyes - conjunctivae clear, PERRL  ENT - mask in place  Lymph - no cervical or supraclavicular lymphadenopathy appreciated  Resp - clear to auscultation bilaterally, no wheezes, crackles or rales  CV - normal rate, regular rhythm, no murmur appreciated, no lower extremity edema noted  GI - abdomen soft, nontender, nondistended, no masses, bowel sounds normal  Skin - no rashes, no lesions noted  Neuro - AOx3, EOM intact, no facial droop, speech fluent and coherent, moves all extremities without asymmetry  Psych - affect normal, mood okay  MSK - left shoulder mass, ill defined but palpable, minimal tenderness.     PATHOLOGY:   Pathology was personally reviewed as described in the HPI, detailed in Epic.     LABS:  Reviewed in Epic    RADIOLOGY:  Imaging was personally viewed and interpreted as summarized in the history (see above).

## 2022-02-26 ENCOUNTER — Ambulatory Visit: Admit: 2022-02-26 | Payer: BLUE CROSS/BLUE SHIELD

## 2022-03-01 MED ORDER — OXYCODONE 5 MG TABLET
ORAL_TABLET | Freq: Four times a day (QID) | ORAL | 0 refills | 25 days | Status: CP | PRN
Start: 2022-03-01 — End: 2022-03-31

## 2022-03-01 NOTE — Unmapped (Signed)
Hi,    Patient Heidi MainsDorothy P Woodard called requesting a medication refill for the following:    Medication: Oxycodone  Dosage: 5mg   Days left of medication: 0  Pharmacy:   [facility] [address] - [name], [place] - [address]  [address], [name] [address]  Phone: [phone]  Fax: [phone]           The expected turnaround time is 3-4 business days       Check Indicates criteria has been reviewed and confirmed with the patient:    [x]  Preferred Name   [x]  DOB and/or MR#  [x]  Preferred Contact Method  [x]  Phone Number(s)   [x]  Preferred Pharmacy     Thank you,  Iona Hansenorene M Lewis  [facility] Communication Center  [phone]

## 2022-03-01 NOTE — Unmapped (Signed)
Medication Refill Request      Who is requesting: Pt   Medication: Oxycodone 5 mg   Refills: 0  Pills left: 11  Pharmacy:    [facility] [address] [place] - [address]  [address], [place] [address]  Phone: [phone]  Fax: [phone]     Last ordering provider: Dr. Meredith Mody   Last refill date:  50/5     Advised that I would request a refill and if appropriate it will be refilled.

## 2022-03-01 NOTE — Unmapped (Signed)
Called patient to let her know that the refill was sent in for her. No further needs at this time.

## 2022-03-15 MED ORDER — MORPHINE ER 15 MG TABLET,EXTENDED RELEASE
ORAL_TABLET | Freq: Every evening | ORAL | 0 refills | 30 days | Status: CP
Start: 2022-03-15 — End: ?

## 2022-03-15 NOTE — Unmapped (Signed)
Returned call to pt. Let her know script was sent in as requested to her pharmacy.She will call back with any further needs.

## 2022-03-15 NOTE — Unmapped (Signed)
.  Hi,    Patient Heidi Woodard called requesting a medication refill for the following:    Medication: Morphine  Dosage: 15mg   Days left of medication: 0  [facility] [place]    She is calling requesting a prescription refill for this medication. Please contact her back at [phone] when this medication has been called in so she can make preparations to pick it up.       Thank you,  Noland Fordyce  [facility] Cancer Communication Center  [phone]

## 2022-03-22 NOTE — Unmapped (Signed)
[facility] Specialty Pharmacy Refill Coordination Note    Specialty Medication(s) to be Shipped:   Hematology/Oncology: Nexavar    Other medication(s) to be shipped: No additional medications requested for fill at this time     Heidi Woodard, DOB: 05-26-1971  Phone: [phone] (home) [phone] (work)      All above HIPAA information was verified with patient.     Was a Nurse, learning disability used for this call? No    Completed refill call assessment today to schedule patient's medication shipment from the [facility] [phone]).  All relevant notes have been reviewed.     Specialty medication(s) and dose(s) confirmed: Regimen is correct and unchanged.   Changes to medications: [name] reports no changes at this time.  Changes to insurance: No  New side effects reported not previously addressed with a pharmacist or physician: None reported  Questions for the pharmacist: No    Confirmed patient received a Conservation officer, historic buildings and a Surveyor, mining with first shipment. The patient will receive a drug information handout for each medication shipped and additional FDA Medication Guides as required.       DISEASE/MEDICATION-SPECIFIC INFORMATION        N/A    SPECIALTY MEDICATION ADHERENCE     Medication Adherence    Patient reported X missed doses in the last month: 0  Specialty Medication: Nexavar 200 mg  Patient is on additional specialty medications: No  Informant: patient              Were doses missed due to medication being on hold? No    Nexavar  200 mg: 14 days of medicine on hand       REFERRAL TO PHARMACIST     Referral to the pharmacist: Not needed      [facility]     Shipping address confirmed in Epic.     Delivery Scheduled: Yes, Expected medication delivery date: 04/06/22.     Medication will be delivered via UPS to the prescription address in Epic [place].    Heidi Woodard   [facility] Specialty Technician

## 2022-03-23 NOTE — Unmapped (Signed)
Hi,    Patient Heidi Woodard contacted the Communication Center to cancel their appointment for today.  The appointment has been cancelled.    Cancellation Reason: Had to bring daughter to ED    Please call patient back to re-schedule MRI, labs and follow up appt    Thank you,  [name]  [facility] Cancer Communication Center   [phone]

## 2022-03-23 NOTE — Unmapped (Unsigned)
[facility] BONE AND SOFT TISSUE ONCOLOGY RETURN VISIT    Encounter Date: 03/23/2022  Patient Name: Heidi Woodard  Medical Record Number: [id number]    Referring Physician: Sheral Apley, MD  [address]  [facility] [address] [place],  [place] [address]    Primary Care Provider: [facility]    DIAGNOSIS:  No diagnosis found.      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 Continue prn topical steroids  - Continue urea cream on callouses on feet and also along neck.   - MRI Left shoulder, 02/08/20, similar to decreased bulk of shoulder mass  - 05/20/20: held sorafenib in anticipation of TKR on 05/26/20  - 08/04/20: MRI Left shoulder, similar size but increase in pain/swelling. Restarted sorafenib 200 mg daily.    - 12/12/20: Continues on sorafenib 200 mg daily, doing well. Improvement in desmoid associated pain, manageable with low dose opioids. HFS, HTN, nausea under control.  -02/13/21: Pain progressively worse over past few months, requiring increasing doses of opioids. MRI is stable/slightly better. However, given her pain, referred for cryoablation, Dr. Cathie Hoops feels it is amenable.  -05/29/21: Seen in follow up. Tolerating sorafenib well. Pain is stable on opioid regimen.  -08/28/21: MRI shows essentially stable disease although minimal growth. Pain and range of motion are stable today. Continue stable dose of sorafenib. Will ask Dr. Cathie Hoops to review MRI if pain worsens.    -11/27/21: Doing okay today. Pain largely stable. Labs good, continue sorafenib 200mg . Reviewed history, 400mg  dose not tolerated well.    03/23/22: Scans personally reviewed, interpreted and discussed with the patient, which show ***     -Possible future options if progression: doxil, sorafenib dose escalation (likely will not be well tolerated), pazopanib, nirogestat. Essential HTN - likely exacerbated by sorafenib  - asked patient to get BP cuff and call if BP continues >140/90  - continue hydrochlorothiazide/lisinopril     Desmoid tumor associated pain - improved with sorafenib, then a bit worse. Had follow up visit in May for discussion about safe prescribing, adherence to dose as prescribed, inability to do any more early refills, as she had been requesting several early refills due to escalating pain. Now stable for several months, adherent to pain management agreement.     Nighttime MS contin 15mg  (#30 per month)   Oxycodone 5mg  Q4h prn, max 3 pills daily (#100 monthly)     No early refills moving forward without clear discussion of the situation.     HFS, mild   - continue urea 10-20% cream - escalate to TID given calluses on hands  - continue aggressive emollient use to prevent dry skin     Birth control: Says she is not sexually active. Aware of teratogenic risk of pregnancy on sorafenib.     Dispo:  RTC 3 mos without scans, 6 mos with MRI    I personally reviewed the medical records, pathology and laboratory results and viewed the imaging. All questions were answered to the patient's apparent satisfaction and they voiced understanding and agreement with the plan. Pt has my card and contact information and is encouraged to reach out with any further questions.    Donzetta Sprung, MD/MPH  Assistant Professor  Bone and Soft Tissue Oncology Program  [facility]  Pager:  [phone]  Nurse Navigator: [name] RN, BSN, OCN      REASON FOR CONSULTATION:   Heidi Woodard is a 51 y.o. female who is seen in consultation at the request of Sheral Apley, MD for evaluation of her desmoid fibromatosis.      HISTORY OF PRESENT ILLNESS:      In 2016, pt developed shoulder pain associated with a fall. There was bruising and swelling but she attributed it to the injury. It never improved, and by summer she noted increased swelling and decreased ROM (which is limited by pain, rather than painless weakness). 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 She underwent CT of the left shoudler on 06/25/16 which showed a 16 cm mass involing the left deltoid with likely invasion of the left trapezius, triceps, and teres minor. Possible LN involvement of axillary and supraclav LN on left were present. On 07/07/2016 she saw Dr. Darral Dash and ultrasound-guided biopsy was performed with pathology revealing low-grade spindle cell lesion favoring fibromatosis. He felt that surgical resection would be highly morbid. On 9/20/017, she saw Dr Beckey Rutter, who did not favor RT due to inherent risks associated with RT.      She has ongoing pain, some tingling along the backs of her hands. Pain is intermittent if her arm is at rest, happening about every other day but when it happens it is very intense. Per above it limits her ADLs although she says she is managing.     Interval history:   ***    REVIEW OF SYSTEMS:  A comprehensive review of 12 systems was negative except for pertinent positives noted in HPI.    Past Medical History, Surgical History, Family History were reviewed personally. Any changes were updated as above.    Social History:  Lives in [place], [place]  Has a supportive boyfriend  No t/e/d  78 year old daughter    Pregnancy status: perimenopausal, not sexually active    ALLERGIES/MEDICATIONS:  Reviewed in Epic    PHYSICAL EXAM:   There were no vitals taken for this visit.  ECOG 0  Gen - well appearing, in no acute distress  Eyes - conjunctivae clear, PERRL  ENT - mask in place  Lymph - no cervical or supraclavicular lymphadenopathy appreciated  Resp - clear to auscultation bilaterally, no wheezes, crackles or rales  CV - normal rate, regular rhythm, no murmur appreciated, no lower extremity edema noted  GI - abdomen soft, nontender, nondistended, no masses, bowel sounds normal  Skin - no rashes, no lesions noted  Neuro - AOx3, EOM intact, no facial droop, speech fluent and coherent, moves all extremities without asymmetry  Psych - affect normal, mood okay  MSK - left shoulder mass, ill defined but palpable, minimal tenderness.     PATHOLOGY:   Pathology was personally reviewed as described in the HPI, detailed in Epic.     LABS:  Reviewed in Epic    RADIOLOGY:  Imaging was personally viewed and interpreted as summarized in the history (see above).

## 2022-04-01 MED ORDER — OXYCODONE 5 MG TABLET
ORAL_TABLET | Freq: Four times a day (QID) | ORAL | 0 refills | 25 days | Status: CP | PRN
Start: 2022-04-01 — End: 2022-05-01

## 2022-04-01 NOTE — Unmapped (Signed)
Hi,    Patient Heidi Woodard called requesting a medication refill for the following:    Medication: oxyCODONE  Dosage: 5 mg  Days left of medication: 3  Pharmacy: [facility], [place]      The expected turnaround time is 3-4 business days     Thank you,  Yolanda Bonine  [facility] Cancer Communication Center  [phone]

## 2022-04-05 MED FILL — NEXAVAR 200 MG TABLET: ORAL | 30 days supply | Qty: 30 | Fill #1

## 2022-04-09 ENCOUNTER — Ambulatory Visit: Admit: 2022-04-09 | Discharge: 2022-04-10 | Payer: BLUE CROSS/BLUE SHIELD

## 2022-04-09 ENCOUNTER — Other Ambulatory Visit: Admit: 2022-04-09 | Discharge: 2022-04-10 | Payer: BLUE CROSS/BLUE SHIELD

## 2022-04-09 DIAGNOSIS — D481 Neoplasm of uncertain behavior of connective and other soft tissue: Principal | ICD-10-CM

## 2022-04-09 LAB — COMPREHENSIVE METABOLIC PANEL
ALBUMIN: 3.5 g/dL (ref 3.4–5.0)
ALKALINE PHOSPHATASE: 80 U/L (ref 46–116)
ALT (SGPT): 23 U/L (ref 10–49)
ANION GAP: 6 mmol/L (ref 5–14)
AST (SGOT): 26 U/L (ref <=34)
BILIRUBIN TOTAL: 0.5 mg/dL (ref 0.3–1.2)
BLOOD UREA NITROGEN: 11 mg/dL (ref 9–23)
BUN / CREAT RATIO: 13
CALCIUM: 8.9 mg/dL (ref 8.7–10.4)
CHLORIDE: 103 mmol/L (ref 98–107)
CO2: 32 mmol/L — ABNORMAL HIGH (ref 20.0–31.0)
CREATININE: 0.82 mg/dL — ABNORMAL HIGH
EGFR CKD-EPI (2021) FEMALE: 86 mL/min/{1.73_m2} (ref >=60)
GLUCOSE RANDOM: 93 mg/dL (ref 70–179)
POTASSIUM: 3.7 mmol/L (ref 3.4–4.8)
PROTEIN TOTAL: 7.5 g/dL (ref 5.7–8.2)
SODIUM: 141 mmol/L (ref 135–145)

## 2022-04-09 LAB — CBC W/ AUTO DIFF
BASOPHILS ABSOLUTE COUNT: 0 10*9/L (ref 0.0–0.1)
BASOPHILS RELATIVE PERCENT: 0.3 %
EOSINOPHILS ABSOLUTE COUNT: 0.3 10*9/L (ref 0.0–0.5)
EOSINOPHILS RELATIVE PERCENT: 4.6 %
HEMATOCRIT: 36.6 % (ref 34.0–44.0)
HEMOGLOBIN: 12.6 g/dL (ref 11.3–14.9)
LYMPHOCYTES ABSOLUTE COUNT: 2.8 10*9/L (ref 1.1–3.6)
LYMPHOCYTES RELATIVE PERCENT: 43.3 %
MEAN CORPUSCULAR HEMOGLOBIN CONC: 34.6 g/dL (ref 32.0–36.0)
MEAN CORPUSCULAR HEMOGLOBIN: 28.6 pg (ref 25.9–32.4)
MEAN CORPUSCULAR VOLUME: 82.7 fL (ref 77.6–95.7)
MEAN PLATELET VOLUME: 8.1 fL (ref 6.8–10.7)
MONOCYTES ABSOLUTE COUNT: 0.4 10*9/L (ref 0.3–0.8)
MONOCYTES RELATIVE PERCENT: 6 %
NEUTROPHILS ABSOLUTE COUNT: 2.9 10*9/L (ref 1.8–7.8)
NEUTROPHILS RELATIVE PERCENT: 45.8 %
PLATELET COUNT: 283 10*9/L (ref 150–450)
RED BLOOD CELL COUNT: 4.42 10*12/L (ref 3.95–5.13)
RED CELL DISTRIBUTION WIDTH: 15.6 % — ABNORMAL HIGH (ref 12.2–15.2)
WBC ADJUSTED: 6.4 10*9/L (ref 3.6–11.2)

## 2022-04-09 MED ADMIN — gadobenate dimeglumine (MULTIHANCE) 529 mg/mL (0.1mmol/0.2mL) solution 10 mL: 10 mL | INTRAVENOUS | @ 13:00:00 | Stop: 2022-04-09

## 2022-04-09 NOTE — Unmapped (Signed)
[facility] BONE AND SOFT TISSUE ONCOLOGY RETURN VISIT    Encounter Date: 04/09/2022  Patient Name: Heidi Woodard  Medical Record Number: [id number]    Referring Physician: Sheral Apley, MD  [address]  [facility] [address] [place],  [place] [address]    Primary Care Provider: [facility] Department    DIAGNOSIS:  1. Desmoid fibromatosis    2. Callous ulcer, limited to breakdown of skin (CMS-HCC)    3. 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 Continue prn topical steroids  - Continue urea cream on callouses on feet and also along neck.   - MRI Left shoulder, 02/08/20, similar to decreased bulk of shoulder mass  - 05/20/20: held sorafenib in anticipation of TKR on 05/26/20  - 08/04/20: MRI Left shoulder, similar size but increase in pain/swelling. Restarted sorafenib 200 mg daily.    - 12/12/20: Continues on sorafenib 200 mg daily, doing well. Improvement in desmoid associated pain, manageable with low dose opioids. HFS, HTN, nausea under control.  -02/13/21: Pain progressively worse over past few months, requiring increasing doses of opioids. MRI is stable/slightly better. However, given her pain, referred for cryoablation, Dr. Cathie Hoops feels it is amenable.  -05/29/21: Seen in follow up. Tolerating sorafenib well. Pain is stable on opioid regimen.  - 06/2021: cryoablation to the L shoulder.  -08/28/21: MRI shows essentially stable disease although minimal growth. Pain and range of motion are stable today. Continue stable dose of sorafenib. Will ask Dr. Cathie Hoops to review MRI if pain worsens.    -11/27/21: Pain stable. Labs good, continue sorafenib 200mg . Reviewed history, 400mg  dose not tolerated well.    03/23/22: Scans personally reviewed, interpreted and discussed with the patient, which show decrease in size and enhancement of desmoid tumor. Continue sorafenib 200mg  daily. Consider treatment holiday at 2 years after cryoablation (06/2023)     -Possible future options if progression: doxil, sorafenib dose escalation (likely will not be well tolerated), pazopanib, nirogestat.     Essential HTN - likely exacerbated by sorafenib  - asked patient to get BP cuff and call if BP continues >140/90  - continue hydrochlorothiazide/lisinopril     Desmoid tumor associated pain - improved with sorafenib, then a bit worse. Had follow up visit in May for discussion about safe prescribing, adherence to dose as prescribed, inability to do any more early refills, as she had been requesting several early refills due to escalating pain. Now stable for several months, adherent to pain management agreement.     Nighttime MS contin 15mg  (#30 per month)   Oxycodone 5mg  Q4h prn, max 3 pills daily (#100 monthly)     HFS, mild   - continue urea 10-20% cream - escalate to TID given calluses on hands  - continue aggressive emollient use to prevent dry skin     Birth control: Says she is not sexually active. Aware of teratogenic risk of pregnancy on sorafenib.     Dispo:  RTC 3 mos without scans, 6 mos with MRI    I personally reviewed the medical records, pathology and laboratory results and viewed the imaging. All questions were answered to the patient's apparent satisfaction and they voiced understanding and agreement with the plan. Pt has my card  and contact information and is encouraged to reach out with any further questions.    Donzetta Sprung, MD/MPH  Assistant Professor  Bone and Soft Tissue Oncology Program  [facility]  Pager: [phone]  Nurse Navigator: [name] RN, BSN, OCN      REASON FOR CONSULTATION:   Heidi Woodard is a 51 y.o. female who is seen in consultation at the request of Sheral Apley, MD for evaluation of her desmoid fibromatosis.      HISTORY OF PRESENT ILLNESS:      In 2016, pt developed shoulder pain associated with a fall. There was bruising and swelling but she attributed it to the injury. It never improved, and by summer she noted increased swelling and decreased ROM (which is limited by pain, rather than painless weakness). 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 She underwent CT of the left shoudler on 06/25/16 which showed a 16 cm mass involing the left deltoid with likely invasion of the left trapezius, triceps, and teres minor. Possible LN involvement of axillary and supraclav LN on left were present. On 07/07/2016 she saw Dr. Darral Dash and ultrasound-guided biopsy was performed with pathology revealing low-grade spindle cell lesion favoring fibromatosis. He felt that surgical resection would be highly morbid. On 9/20/017, she saw Dr Beckey Rutter, who did not favor RT due to inherent risks associated with RT.      She has ongoing pain, some tingling along the backs of her hands. Pain is intermittent if her arm is at rest, happening about every other day but when it happens it is very intense. Per above it limits her ADLs although she says she is managing.     Interval history:   Doing fine overall. Some fatigue, often gets tired in the afternoon. Pain has been flaring over the past week or two, made her worried about today's scans. Pain regimen mostly working well. Trying to stay active.    REVIEW OF SYSTEMS:  A comprehensive review of 12 systems was negative except for pertinent positives noted in HPI.    Past Medical History, Surgical History, Family History were reviewed personally. Any changes were updated as above.    Social History:  Lives in [place], [place]  Has a supportive boyfriend  No t/e/d  56 year old daughter    Pregnancy status: perimenopausal, not sexually active    ALLERGIES/MEDICATIONS:  Reviewed in Epic    PHYSICAL EXAM:   BP 169/93  - Pulse 78  - Temp 35.8 ??C (96.5 ??F) (Temporal)  - Resp 18  - Ht 157.5 cm (5' 2)  - Wt (!) 106.7 kg (235 lb 4.8 oz)  - SpO2 98%  - BMI 43.04 kg/m??   ECOG 1  Gen - well appearing, in no acute distress  Eyes - conjunctivae clear, PERRL  ENT - mask in place  Lymph - no cervical or supraclavicular lymphadenopathy appreciated  Resp - clear to auscultation bilaterally, no wheezes, crackles or rales  CV - normal rate, regular rhythm, no murmur appreciated, no lower extremity edema noted  GI - abdomen soft, nontender, nondistended, no masses, bowel sounds normal  Skin - no rashes, no lesions noted  Neuro - AOx3, EOM intact, no facial droop, speech fluent and coherent, moves all extremities without asymmetry  Psych - affect normal, mood okay  MSK - left shoulder mass, ill defined but palpable, minimal tenderness.  PATHOLOGY:   Pathology was personally reviewed as described in the HPI, detailed in Epic.     LABS:  Reviewed in Epic    RADIOLOGY:  Imaging was personally viewed and interpreted as summarized in the history (see above).

## 2022-04-09 NOTE — Unmapped (Unsigned)
Peripheral stick done by Girard Cooter, 23g R AC, labs drawn and sent.

## 2022-04-09 NOTE — Unmapped (Addendum)
It was a pleasure to see you in clinic today.    Scans look great, desmoid is smaller and less active on the MRI.    For appointments & questions Monday through Friday 8 AM--5 PM     Please call [phone] or Toll free [phone]    For urgent clinical needs on Nights, Weekends or Holidays:  Call [phone] and ask for the oncologist on call.     For appointment changes please contact during normal business hours.     Please visit [email or web address], a resource created just for family members and caregivers.  This website lists support services, how and where to ask for help. It has tools to assist you as you help [place] care for your loved one.    Donzetta Sprung, MD, MPH  Assistant Professor  Bone and Soft Tissue Oncology Program  Nurse Navigator: [name]    [facility]  [address]  [place], [place] [address]  [email or web address]

## 2022-04-13 MED ORDER — MORPHINE ER 15 MG TABLET,EXTENDED RELEASE
ORAL_TABLET | Freq: Every evening | ORAL | 0 refills | 30 days | Status: CP
Start: 2022-04-13 — End: ?

## 2022-04-13 NOTE — Unmapped (Signed)
Hi,    Patient Heidi Woodard called requesting a medication refill for the following:    Medication:   MORPhine (MS CONTIN)   Dosage: 15 MG 12 hr tablet   Days left of medication: 2  Pharmacy: [facility] [address] [place] - [address]  [address], [place] [address]  Phone: [phone]  Fax: [phone]          The expected turnaround time is 3-4 business days           Thank you,  Warnell Bureau  [facility] Communication Center  [phone]

## 2022-04-15 NOTE — Unmapped (Signed)
Hi,    Patient Heidi Woodard called requesting a medication refill for the following:    Medication: Morphine   Dosage: 15 mg  Days left of medication: 0  Pharmacy:   [facility] [address], [place] - [address]  [address], [name] [address]  Phone: [phone]  Fax: [phone]                 Medication: Oxycodone    Dosage: 5mg   Days left of medication: 0  Pharmacy:   [facility] [address], [place] - [address]  [address], [place] [address]  Phone: [phone]  Fax: [phone]       The expected turnaround time is 3-4 business days       Check Indicates criteria has been reviewed and confirmed with the patient:    [x]  Preferred Name   [x]  DOB and/or MR#  []  Preferred Contact Method  [x]  Phone Number(s)   [x]  Preferred Pharmacy   []  MyChart     Thank you,  Iona Hansen  [facility] Communication Center  [phone]

## 2022-04-15 NOTE — Unmapped (Signed)
Returned call to Heidi Woodard.Let her know I spoke with her pharmacy and a PA is needed for her morphine.Will send a request to our MAP team for this to be completed.She has already gotten her oxycodone script.Will let her know when the PA is done.She is agreeable to this plan and will call back with any further needs.

## 2022-04-16 DIAGNOSIS — G893 Neoplasm related pain (acute) (chronic): Principal | ICD-10-CM

## 2022-04-16 NOTE — Unmapped (Signed)
-  Shiree stated Prior Authorization is needed for MORPhine (MS CONTIN) 15 MG 12 hr tablet     Please contact Iris at [phone]    Thank you,   Warnell Bureau  [facility] Communication Center  [phone]

## 2022-04-16 NOTE — Unmapped (Signed)
Called pt to let her know the PA for morphine has been approved.She can see about picking it up later today.She will call back with any further needs.

## 2022-04-22 NOTE — Unmapped (Signed)
[facility] Refill Coordination Note    Specialty Medication(s) to be Shipped:   Hematology/Oncology: Nexavar    Other medication(s) to be shipped: No additional medications requested for fill at this time     Heidi Woodard, DOB: 1971/06/17  Phone: [phone] (home) [phone] (work)      All above HIPAA information was verified with patient.     Was a Nurse, learning disability used for this call? No    Completed refill call assessment today to schedule patient's medication shipment from the [facility] [phone]).  All relevant notes have been reviewed.     Specialty medication(s) and dose(s) confirmed: Regimen is correct and unchanged.   Changes to medications: Heidi Woodard reports no changes at this time.  Changes to insurance: No  New side effects reported not previously addressed with a pharmacist or physician: None reported  Questions for the pharmacist: No    Confirmed patient received a Conservation officer, historic buildings and a Surveyor, mining with first shipment. The patient will receive a drug information handout for each medication shipped and additional FDA Medication Guides as required.       DISEASE/MEDICATION-SPECIFIC INFORMATION        N/A    SPECIALTY MEDICATION ADHERENCE     Medication Adherence    Patient reported X missed doses in the last month: 0  Specialty Medication: Nexavar 200 mg  Patient is on additional specialty medications: No  Informant: patient              Were doses missed due to medication being on hold? No    Nexavar  200 mg: 14 days of medicine on hand       REFERRAL TO PHARMACIST     Referral to the pharmacist: Not needed      [facility]     Shipping address confirmed in Epic.     Delivery Scheduled: Yes, Expected medication delivery date: 05/10/22.     Medication will be delivered via UPS to the prescription address in Epic [place].    Heidi Woodard   [facility] Specialty Technician

## 2022-04-29 MED ORDER — OXYCODONE 5 MG TABLET
ORAL_TABLET | Freq: Four times a day (QID) | ORAL | 0 refills | 25 days | Status: CP | PRN
Start: 2022-04-29 — End: 2022-05-29

## 2022-04-29 NOTE — Unmapped (Signed)
Hi,    Patient Heidi Woodard called requesting a medication refill for the following:    Medication: Oxycodone  Dosage: 5 mg  Days left of medication: 2  Pharmacy: [facility]      The expected turnaround time is 3-4 business days       Check Indicates criteria has been reviewed and confirmed with the patient:    [x]  Preferred Name   [x]  DOB and/or MR#  [x]  Preferred Contact Method  [x]  Phone Number(s)   [x]  Preferred Pharmacy   []  MyChart     Thank you,  [name]  [facility] Communication Center  [phone]

## 2022-05-03 ENCOUNTER — Other Ambulatory Visit (HOSPITAL_COMMUNITY): Payer: Self-pay | Admitting: *Deleted

## 2022-05-03 DIAGNOSIS — Z1231 Encounter for screening mammogram for malignant neoplasm of breast: Secondary | ICD-10-CM

## 2022-05-07 MED FILL — NEXAVAR 200 MG TABLET: ORAL | 30 days supply | Qty: 30 | Fill #2

## 2022-05-17 MED ORDER — MORPHINE ER 15 MG TABLET,EXTENDED RELEASE
ORAL_TABLET | Freq: Every evening | ORAL | 0 refills | 30 days
Start: 2022-05-17 — End: ?

## 2022-05-17 NOTE — Unmapped (Signed)
Hi,    Patient SAHIBA KILBANE called requesting a medication refill for the following:    Medication: Morphine  Dosage: 15 mg  Days left of medication: 0  Pharmacy: [id number]  Fax [phone]        [x]  Preferred Name   [x]  DOB and/or MR#  [x]  Preferred Contact Method  [x]  Phone Number(s)   [x]  Preferred Pharmacy   [x]  MyChart     Thank you,  Cipriano Mile  [facility] Communication Center  [phone]

## 2022-05-18 MED ORDER — MORPHINE ER 15 MG TABLET,EXTENDED RELEASE
ORAL_TABLET | Freq: Every evening | ORAL | 0 refills | 30 days | Status: CP
Start: 2022-05-18 — End: ?

## 2022-05-18 NOTE — Unmapped (Signed)
Called pt to let her know refill was sent in as requested.Asked her to call back with any further questions/concerns.

## 2022-05-20 NOTE — Unmapped (Signed)
.  Hi,     Patient Heidi Woodard  contacted the Communication Center requesting to speak with the care team of Heidi Woodard to discuss:    She is calling in stating that the pharmacy received her prescription for her medication morphine 15mg . The pharmacy just let her know she needed to contact the doctor to let them know that they have to have a prior authorization for this.     Please contact her back  at [phone].        Thank you,   Noland Fordyce  [facility] Communication Center   [phone]

## 2022-05-20 NOTE — Unmapped (Signed)
Called Ms. Guinane back to let her know that I have sent this request for PA to the pharmacist. Will call back when PA is done to update her.

## 2022-05-21 DIAGNOSIS — G893 Neoplasm related pain (acute) (chronic): Principal | ICD-10-CM

## 2022-05-21 NOTE — Unmapped (Signed)
Called patient back and left a HIPAA compliant message indicating that we are currently working on the [place].

## 2022-05-21 NOTE — Unmapped (Signed)
-  Heidi Woodard stated she contacted her pharmacy and they informed her that they are still waiting on a prior authorization     Please contact Ginny at [phone]    Thank you,   Warnell Bureau  [facility] Communication Center  [phone]

## 2022-05-24 MED ORDER — OXYCODONE 5 MG TABLET
ORAL_TABLET | Freq: Four times a day (QID) | ORAL | 0 refills | 25 days | Status: CP | PRN
Start: 2022-05-24 — End: 2022-06-23

## 2022-05-24 NOTE — Unmapped (Signed)
Called patient to let her know that the medication Prior Auth went through and that her pharmacy should be filling it. No further needs at this time.

## 2022-05-24 NOTE — Unmapped (Signed)
Hi,    Patient Heidi Woodard called requesting a medication refill for the following:    Medication: Oxycodone  Dosage: 5 mg  Days left of medication: 0  Pharmacy: [id number]  Fax [phone]      [x]  Preferred Name   [x]  DOB and/or MR#  [x]  Preferred Contact Method  [x]  Phone Number(s)   [x]  Preferred Pharmacy   [x]  MyChart     Thank you,  Cipriano Mile  [facility] Communication Center  [phone]

## 2022-05-30 IMAGING — MG MM DIGITAL SCREENING BILAT W/ TOMO AND CAD
6 of 10 series · 6 of 30 positions shown · non-contrast
Comparison: Previous exam(s).

CLINICAL DATA: Screening.

EXAM:
DIGITAL SCREENING BILATERAL MAMMOGRAM WITH TOMOSYNTHESIS AND CAD
TECHNIQUE: Bilateral screening digital craniocaudal and mediolateral oblique
mammograms were obtained. Bilateral screening digital breast
tomosynthesis was performed. The images were evaluated with
computer-aided detection.

[L MLO synth-2D (1 of 2)]
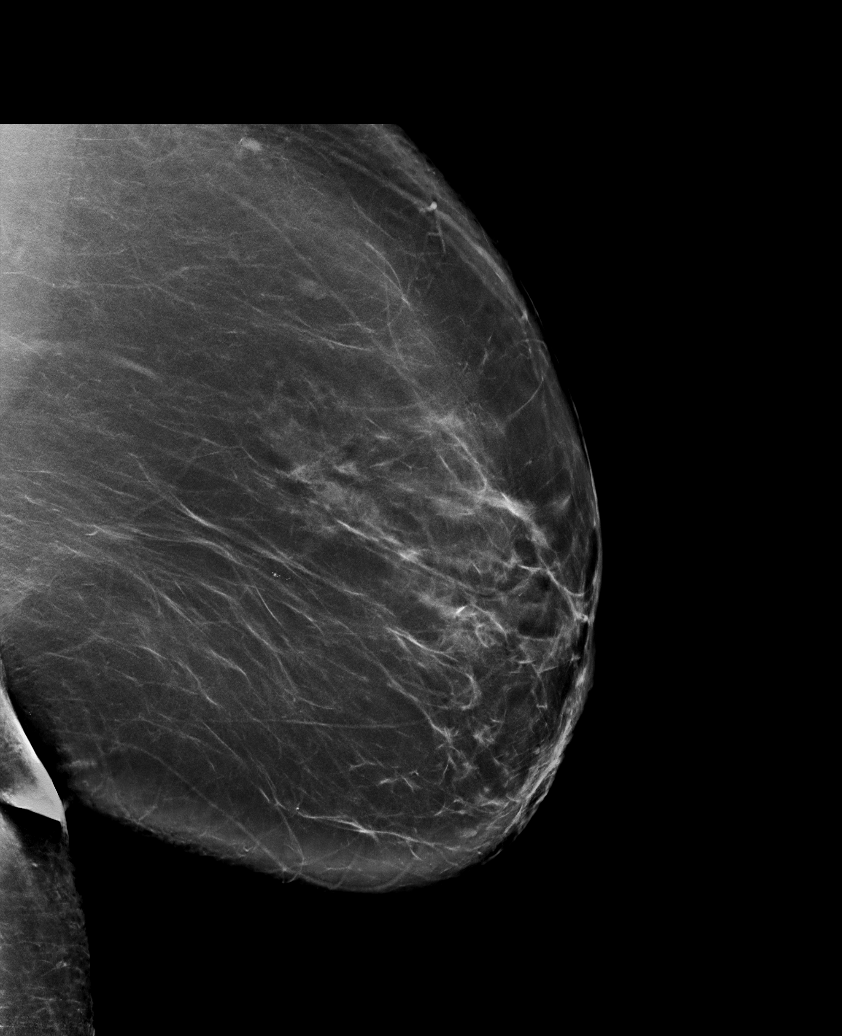

[R MLO synth-2D]
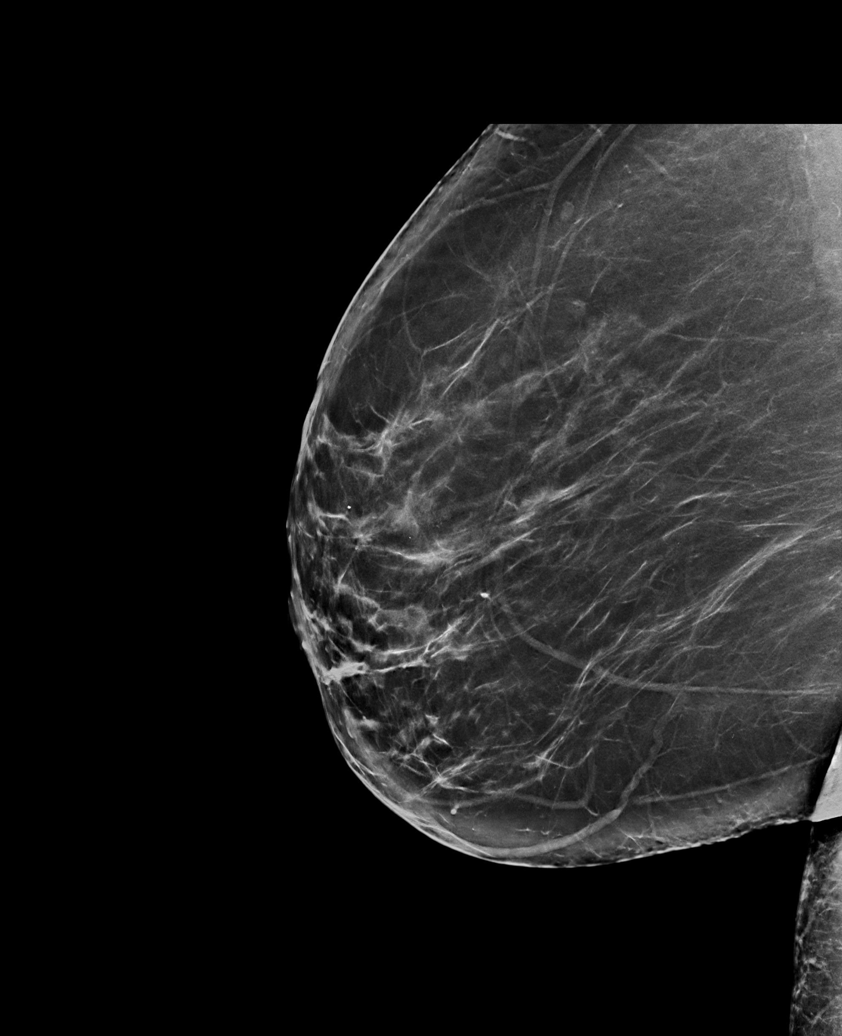

[R CC synth-2D]
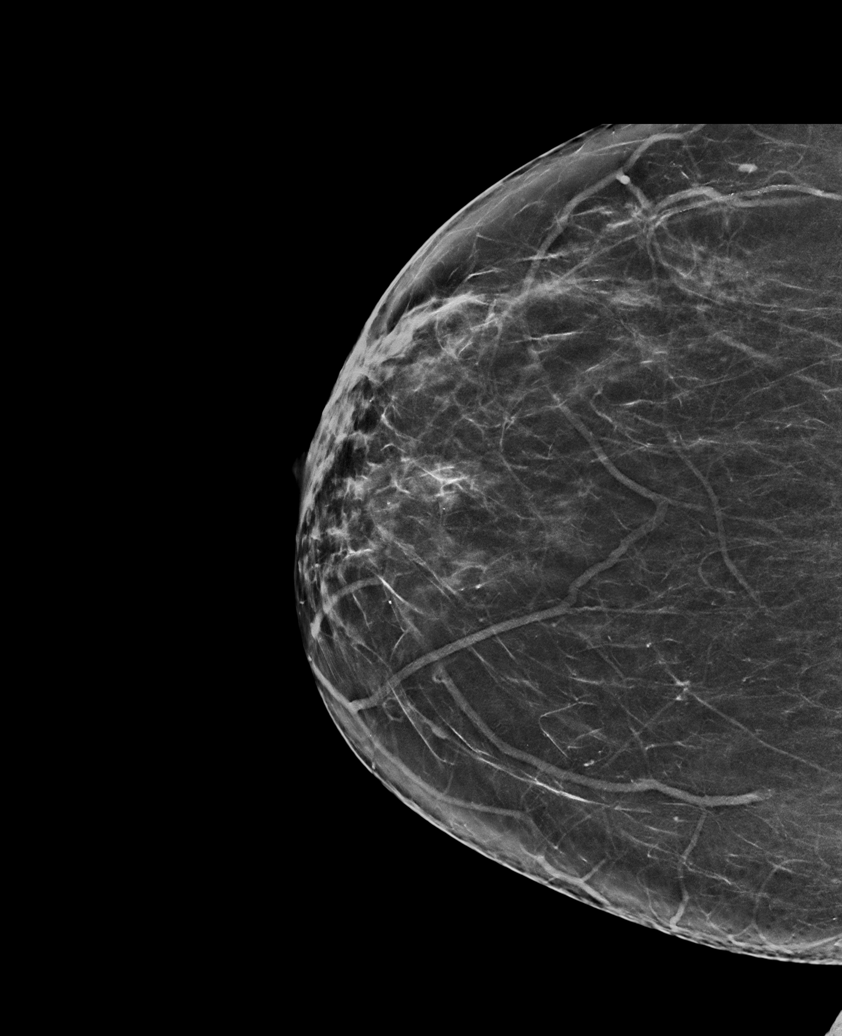

[L MLO synth-2D (2 of 2)]
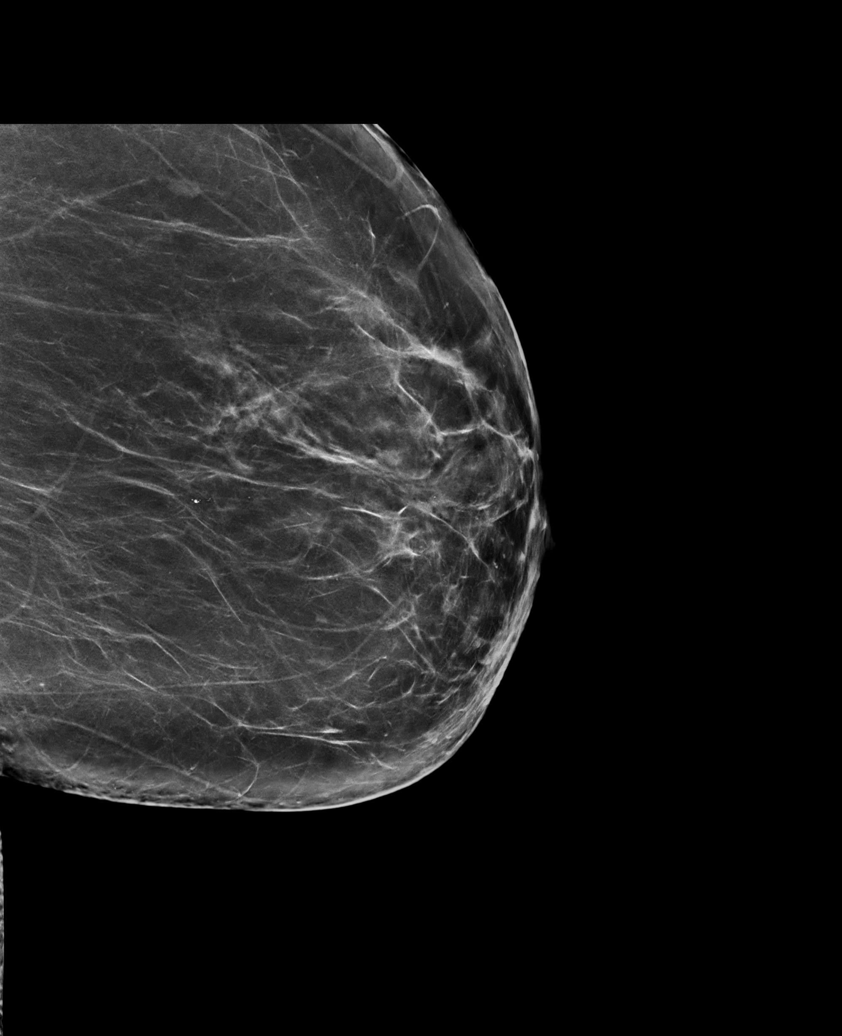

[L CC synth-2D]
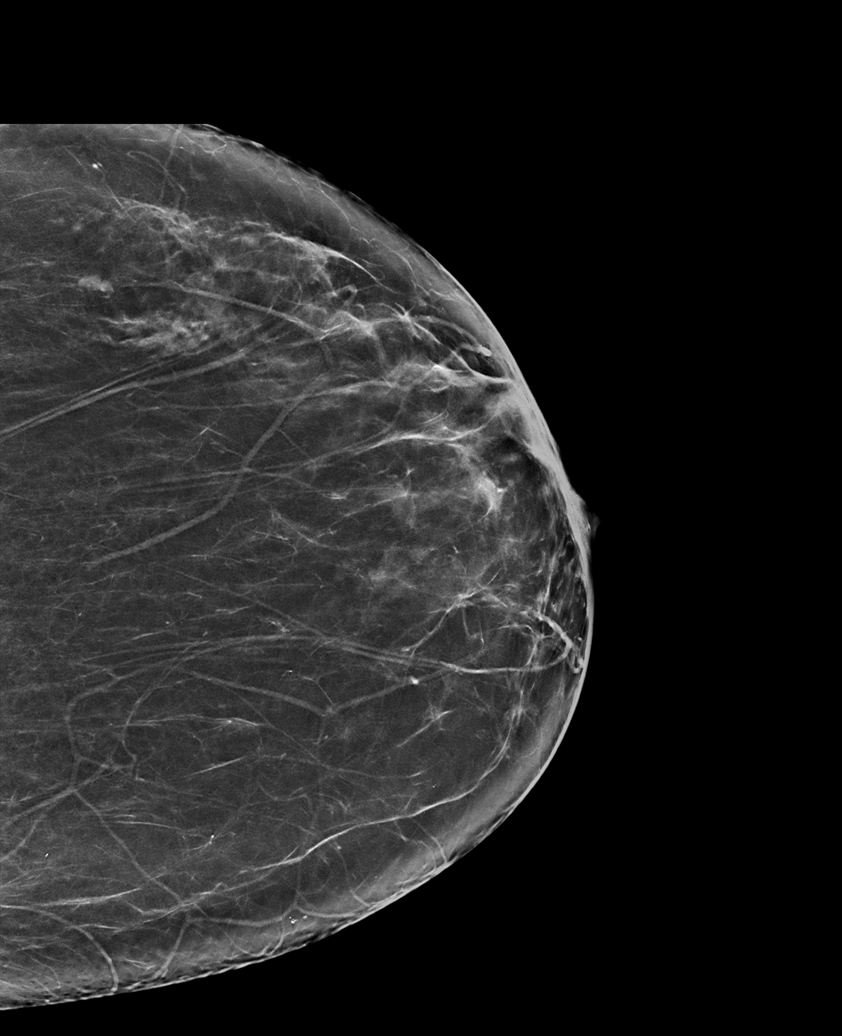

[R MLO tomo · tomo slice 44/87.0]
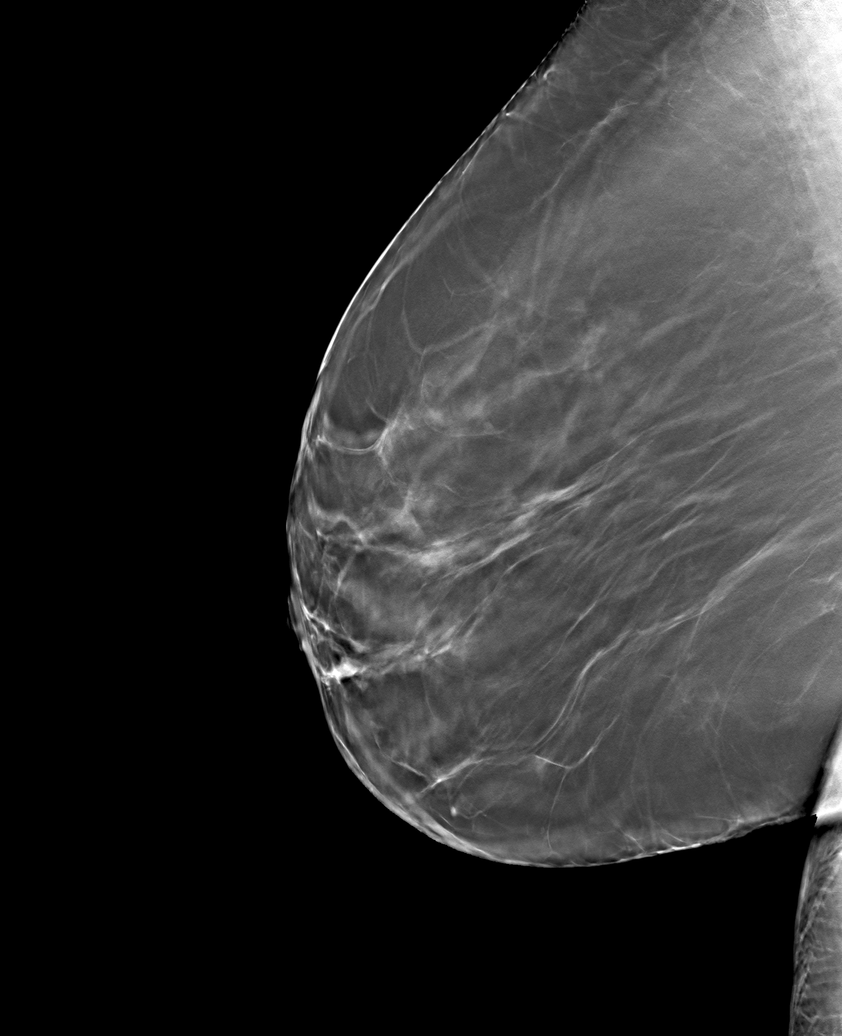

[6 of 30 positions shown; findings below may reference images not displayed]

ACR Breast Density Category b: There are scattered areas of
fibroglandular density.
FINDINGS: There are no findings suspicious for malignancy.
IMPRESSION: No mammographic evidence of malignancy. A result letter of this
screening mammogram will be mailed directly to the patient.

RECOMMENDATION:
Screening mammogram in one year. (Code:51-O-LD2)

BI-RADS CATEGORY  1: Negative.

## 2022-05-31 NOTE — Unmapped (Signed)
[facility] Clinical Assessment & Refill Coordination Note    Heidi Woodard, DOB: Apr 01, 1971  Phone: [phone] (home) [phone] (work)    All above HIPAA information was verified with patient.     Was a Nurse, learning disability used for this call? No    Specialty Medication(s):   Hematology/Oncology: Nexavar     Current Outpatient Medications   Medication Sig Dispense Refill    celecoxib (CELEBREX) 200 MG capsule Take 200 mg by mouth daily.      cyclobenzaprine (FLEXERIL) 10 MG tablet Take 10 mg by mouth daily.      lisinopril-hydrochlorothiazide (PRINZIDE,ZESTORETIC) 20-25 mg per tablet Take 1 tablet by mouth daily.      MORPhine (MS CONTIN) 15 MG 12 hr tablet Take 1 tablet (15 mg total) by mouth at bedtime. 30 tablet 0    ondansetron (ZOFRAN) 8 MG tablet Take 1 tablet (8 mg total) by mouth every eight (8) hours as needed for nausea. 30 tablet 3    oxyCODONE (ROXICODONE) 5 MG immediate release tablet Take 1 tablet (5 mg total) by mouth every six (6) hours as needed for pain (moderate to severe pain). 100 tablet 0    potassium gluconate 2.5 mEq Tab Take 1 tablet by mouth.      simvastatin (ZOCOR) 40 MG tablet Take 40 mg by mouth nightly.      SORAfenib (NEXAVAR) 200 mg tablet Take 1 tablet (200 mg total) by mouth daily. Take on empty stomach. 30 tablet 11    therapeutic multivitamin (THERAGRAN) tablet Take 1 tablet by mouth daily.      urea (CARMOL) 10 % cream APPLY NIGHTLY TO AFFECTED PRESSURE CALLOUS ON FOOT 85 g 5    urea-alpha hydroxy acids 10-4 % Crea APPLY NIGHTLY TO AFFECTED PRESSURE CALLOUS ON FOOT       No current facility-administered medications for this visit.        Changes to medications: Heidi Woodard reports no changes at this time.    Allergies   Allergen Reactions    Augmentin [Amoxicillin-Pot Clavulanate] Hives    Penicillins Hives       Changes to allergies: No    SPECIALTY MEDICATION ADHERENCE     Nexavar 200 mg: 14 days of medicine on hand     Medication Adherence Patient reported X missed doses in the last month: 0  Specialty Medication: Nexavar 200 mg once daily at night  Patient is on additional specialty medications: No  Informant: patient  Confirmed plan for next specialty medication refill: delivery by pharmacy  Refills needed for supportive medications: not needed          Specialty medication(s) dose(s) confirmed: Regimen is correct and unchanged.     Are there any concerns with adherence? No    Adherence counseling provided? Not needed    CLINICAL MANAGEMENT AND INTERVENTION      Clinical Benefit Assessment:    Do you feel the medicine is effective or helping your condition? Yes    Clinical Benefit counseling provided? Not needed    Adverse Effects Assessment:    Are you experiencing any side effects? No    Are you experiencing difficulty administering your medicine? No    Quality of Life Assessment:    Quality of Life    Rheumatology  Oncology  1. What impact has your specialty medication had on the reduction of your daily pain or discomfort level?: None  2. On a scale of 1-10, how would you rate your ability  to manage side effects associated with your specialty medication? (1=no issues, 10 = unable to take medication due to side effects): 1  Dermatology  Cystic Fibrosis          How many days over the past month did your Desmoid fibromatosis  keep you from your normal activities? For example, brushing your teeth or getting up in the morning. 0    Have you discussed this with your provider? Not needed    Acute Infection Status:    Acute infections noted within Epic:  No active infections  Patient reported infection: None    Therapy Appropriateness:    Is therapy appropriate and patient progressing towards therapeutic goals? Yes, therapy is appropriate and should be continued    DISEASE/MEDICATION-SPECIFIC INFORMATION      N/A    PATIENT SPECIFIC NEEDS     Does the patient have any physical, cognitive, or cultural barriers? No    Is the patient high risk? Yes, patient is taking oral chemotherapy. Appropriateness of therapy as been assessed    Does the patient require a Care Management Plan? No     SOCIAL DETERMINANTS OF HEALTH     At the [facility], we have learned that life circumstances - like trouble affording food, housing, utilities, or transportation can affect the health of many of our patients.   That is why we wanted to ask: are you currently experiencing any life circumstances that are negatively impacting your health and/or quality of life? No    Social Determinants of Psychologist, prison and probation services Strain: Not on file   Internet Connectivity: Not on file   Food Insecurity: Not on file   Tobacco Use: Low Risk     Smoking Tobacco Use: Never    Smokeless Tobacco Use: Never    Passive Exposure: Not on file   Housing/Utilities: Not on file   Alcohol Use: Not on file   Transportation Needs: Not on file   Substance Use: Not on file   Health Literacy: Not on file   Physical Activity: Not on file   Interpersonal Safety: Not on file   Stress: Not on file   Intimate Partner Violence: Not on file   Depression: Not on file   Social Connections: Not on file     Would you be willing to receive help with any of the needs that you have identified today? Not applicable     SHIPPING     Specialty Medication(s) to be Shipped:   Hematology/Oncology: Nexavar    Other medication(s) to be shipped: No additional medications requested for fill at this time     Changes to insurance: No    Delivery Scheduled: Yes, Expected medication delivery date: 06/11/22.     Medication will be delivered via UPS to the confirmed prescription address in [facility].    The patient will receive a drug information handout for each medication shipped and additional FDA Medication Guides as required.  Verified that patient has previously received a Conservation officer, historic buildings and a Surveyor, mining.    The patient or caregiver noted above participated in the development of this care plan and knows that they can request review of or adjustments to the care plan at any time.      All of the patient's questions and concerns have been addressed.    Breck Coons [facility] Specialty Pharmacist

## 2022-06-01 MED FILL — NEXAVAR 200 MG TABLET: ORAL | 30 days supply | Qty: 30 | Fill #3

## 2022-06-07 ENCOUNTER — Ambulatory Visit (HOSPITAL_COMMUNITY)
Admission: RE | Admit: 2022-06-07 | Discharge: 2022-06-07 | Disposition: A | Discharge status: Home / Self Care | Payer: Medicare Other | Source: Ambulatory Visit | Attending: *Deleted | Admitting: *Deleted

## 2022-06-07 DIAGNOSIS — Z1231 Encounter for screening mammogram for malignant neoplasm of breast: Secondary | ICD-10-CM | POA: Insufficient documentation

## 2022-06-21 MED ORDER — OXYCODONE 5 MG TABLET
ORAL_TABLET | Freq: Four times a day (QID) | ORAL | 0 refills | 25 days | Status: CP | PRN
Start: 2022-06-21 — End: 2022-07-21

## 2022-06-21 MED ORDER — MORPHINE ER 15 MG TABLET,EXTENDED RELEASE
ORAL_TABLET | Freq: Every evening | ORAL | 0 refills | 30 days | Status: CP
Start: 2022-06-21 — End: ?

## 2022-06-21 NOTE — Unmapped (Signed)
.  Hi,    Patient Heidi Woodard called requesting a medication refill for the following:    Medication: Morphine 15mg  and Oxycodone 5mg   Dosage:   Days left of medication: 4 of the morphine left and oxycodone 17  Pharmacy: [facility] in [place]    She is calling requesting a refill on both of these medications as she states she is almost out. Please refill these medications asap for the patient.     Patients call back number is [phone].     The expected turnaround time is 3-4 business days         Thank you,  Noland Fordyce  [facility] Communication Center  [phone]

## 2022-06-24 NOTE — Unmapped (Signed)
Hi,     Tawonna contacted the [employer] regarding the following:    - States that she is calling to inform the care team that a prior auth is needed for the oxycodone 5mg , prior auth needs to be sent to [facility] in [place], fax number is [phone]      Thanks in advance,    [name]  [facility] Cancer Communication Center   [phone]

## 2022-06-25 DIAGNOSIS — G893 Neoplasm related pain (acute) (chronic): Principal | ICD-10-CM

## 2022-06-25 NOTE — Unmapped (Signed)
Hi,     Tynasia Milling contacted the Communication Center requesting to speak with the care team of MLYNN WEINKAUF to discuss:    -Patient stated that [facility] pharmacy informed her that a PA is needed for her Oxycodone 5mg .     Please contact Desha  at [phone] .    Thank you,   Durward Fortes  [facility] Cancer Communication Center   [phone]

## 2022-06-25 NOTE — Unmapped (Signed)
Returned call to pt.Let her know we are working on the [place].Told her I would let her know as soon as it is completed.She is agreeable to this plan and will call back with any further needs.

## 2022-06-28 NOTE — Unmapped (Signed)
Called pt to let her know the PA for her med has been approved.Asked that she call back with any further needs.

## 2022-07-02 NOTE — Unmapped (Signed)
[facility] Special[facility] Pharmacy Refill Coordination Note    Specialty Medication(s) to be Shipped:   Hematology/Oncology: Nexavar ((DECLINED))    Other medication(s) to be shipped: No additional medications requested for fill at this time     VEDIA PERK, DOB: 09-Apr-1971  Phone: [phone] (home) [phone] (work)      All above HIPAA information was verified with patient.     Was a Nurse, learning disability used for this call? No    Completed refill call assessment today to schedule patient's medication shipment from the [facility] [phone]).  All relevant notes have been reviewed.     Specialty medication(s) and dose(s) confirmed: Regimen is correct and unchanged.   Changes to medications: Tovah reports no changes at this time.  Changes to insurance: No  New side effects reported not previously addressed with a pharmacist or physician: None reported  Questions for the pharmacist: No    Confirmed patient received a Conservation officer, historic buildings and a Surveyor, mining with first shipment. The patient will receive a drug information handout for each medication shipped and additional FDA Medication Guides as required.       DISEASE/MEDICATION-SPECIFIC INFORMATION        N/A    SPECIALTY MEDICATION ADHERENCE     Medication Adherence    Patient reported X missed doses in the last month: 0  Specialty Medication: Nexavar 200 mg  Patient is on additional specialty medications: No  Informant: patient                       Were doses missed due to medication being on hold? No    Nexavar  200 mg: 23 days of medicine on hand       REFERRAL TO PHARMACIST     Referral to the pharmacist: Not needed      [facility]     Shipping address confirmed in Epic.     Delivery Scheduled: Patient declined refill at this time due to has 23 days left.     Medication will be delivered via UPS to the prescription address in Epic [place].    [name]   [facility] Specialty Technician

## 2022-07-13 ENCOUNTER — Telehealth: Admit: 2022-07-13 | Discharge: 2022-07-14 | Payer: BLUE CROSS/BLUE SHIELD

## 2022-07-13 DIAGNOSIS — D481 Neoplasm of uncertain behavior of connective and other soft tissue: Principal | ICD-10-CM

## 2022-07-13 NOTE — Unmapped (Addendum)
[facility] BONE AND SOFT TISSUE ONCOLOGY RETURN VISIT    Encounter Date: 07/13/2022  Patient Name: Heidi Woodard  Medical Record Number: [id number]    Referring Physician: Sheral Apley, MD  [address]  [facility] [address] [place],  [place] [address]    Primary Care Provider: [facility]      The patient reports they are physically located in [place] and is currently: at home. I conducted a audio/video visit. I spent 20 minutes on the video call with the patient. I spent an additional 10 minutes on pre- and post-visit activities on the date of service .       DIAGNOSIS:  1. Desmoid fibromatosis    2. Chronic left shoulder pain    3. 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 Continue prn topical steroids  - Continue urea cream on callouses on feet and also along neck.   - MRI Left shoulder, 02/08/20, similar to decreased bulk of shoulder mass  - 05/20/20: held sorafenib in anticipation of TKR on 05/26/20  - 08/04/20: MRI Left shoulder, similar size but increase in pain/swelling. Restarted sorafenib 200 mg daily.    - 12/12/20: Continues on sorafenib 200 mg daily, doing well. Improvement in desmoid associated pain, manageable with low dose opioids. HFS, HTN, nausea under control.  -02/13/21: Pain progressively worse over past few months, requiring increasing doses of opioids. MRI is stable/slightly better. However, given her pain, referred for cryoablation, Dr. Cathie Hoops feels it is amenable.  -05/29/21: Seen in follow up. Tolerating sorafenib well. Pain is stable on opioid regimen.  - 06/2021: cryoablation to the L shoulder.  -08/28/21: MRI shows essentially stable disease although minimal growth. Pain and range of motion are stable today. Continue stable dose of sorafenib. Will ask Dr. Cathie Hoops to review MRI if pain worsens.    -11/27/21: Pain stable. Labs good, continue sorafenib 200mg . Reviewed history, 400mg  dose not tolerated well.    03/23/22: Scans show decrease in size and enhancement of desmoid tumor. Continue sorafenib 200mg  daily. Consider treatment holiday at 2 years after cryoablation (06/2023)    07/13/22: Doing well. No concern for progression at present. Continue sorafenib 200mg  daily, scans in 3 mos.     -Possible future options if progression: doxil, sorafenib dose escalation (likely will not be well tolerated), pazopanib, nirogestat.     Essential HTN - likely exacerbated by sorafenib  Generally ~120/80.  - continue hydrochlorothiazide/lisinopril     Desmoid tumor associated pain - improved with sorafenib, then a bit worse. Had follow up visit in May for discussion about safe prescribing, adherence to dose as prescribed, inability to do any more early refills, as she had been requesting several early refills due to escalating pain. Now stable for several months, adherent to pain management agreement.     Nighttime MS contin 15mg  (#30 per month)   Oxycodone 5mg  Q4h prn, max 3 pills daily (#100 monthly)     HFS, mild   - continue urea 10-20% cream - escalate to TID given calluses on hands  - continue aggressive emollient use to prevent dry skin     Birth control: Says she is not sexually active. Aware of teratogenic risk of pregnancy  on sorafenib.     Dispo:  RTC 3 mos with MRI    I personally reviewed the medical records, pathology and laboratory results and viewed the imaging. All questions were answered to the patient's apparent satisfaction and they voiced understanding and agreement with the plan. Pt has my card and contact information and is encouraged to reach out with any further questions.    Donzetta Sprung, MD/MPH  Assistant Professor  Bone and Soft Tissue Oncology Program  [facility]  Pager: [phone]  Nurse Navigator: [name] RN, BSN, OCN      REASON FOR CONSULTATION:   Heidi Woodard is a 51 y.o. female who is seen in consultation at the request of Sheral Apley, MD for evaluation of her desmoid fibromatosis.      HISTORY OF PRESENT ILLNESS:      In 2016, pt developed shoulder pain associated with a fall. There was bruising and swelling but she attributed it to the injury. It never improved, and by summer she noted increased swelling and decreased ROM (which is limited by pain, rather than painless weakness). 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 She underwent CT of the left shoudler on 06/25/16 which showed a 16 cm mass involing the left deltoid with likely invasion of the left trapezius, triceps, and teres minor. Possible LN involvement of axillary and supraclav LN on left were present. On 07/07/2016 she saw Dr. Darral Dash and ultrasound-guided biopsy was performed with pathology revealing low-grade spindle cell lesion favoring fibromatosis. He felt that surgical resection would be highly morbid. On 9/20/017, she saw Dr Beckey Rutter, who did not favor RT due to inherent risks associated with RT.      She has ongoing pain, some tingling along the backs of her hands. Pain is intermittent if her arm is at rest, happening about every other day but when it happens it is very intense. Per above it limits her ADLs although she says she is managing.     Interval history:   Range of motion has been stable to maybe a little bit better. Some mild increase in pain over the past few days. Energy level is stable, maybe improving. Nausea has been up and down - was pretty bad for a minute, now back to under control. She is overall pleased with how things are going.    REVIEW OF SYSTEMS:  A comprehensive review of 12 systems was negative except for pertinent positives noted in HPI.    Past Medical History, Surgical History, Family History were reviewed personally. Any changes were updated as above.    Social History:  Lives in [place], [place]  Has a supportive boyfriend  No t/e/d  38 year old daughter    Pregnancy status: perimenopausal, not sexually active    ALLERGIES/MEDICATIONS:  Reviewed in Epic    PHYSICAL EXAM:   There were no vitals taken for this visit.  ECOG 1  Gen - well appearing, in no acute distress  Eyes - conjunctivae clear, PERRL  ENT - mask in place  Lymph - no cervical or supraclavicular lymphadenopathy appreciated  Resp - clear to auscultation bilaterally, no wheezes, crackles or rales  CV - normal rate, regular rhythm, no murmur appreciated, no lower extremity edema noted  GI - abdomen soft, nontender, nondistended, no masses, bowel sounds normal  Skin - no rashes, no lesions noted  Neuro - AOx3, EOM intact,  no facial droop, speech fluent and coherent, moves all extremities without asymmetry  Psych - affect normal, mood okay  MSK - left shoulder mass, ill defined but palpable, minimal tenderness.     PATHOLOGY:   Pathology was personally reviewed as described in the HPI, detailed in Epic.     LABS:  Reviewed in Epic    RADIOLOGY:  Imaging was personally viewed and interpreted as summarized in the history (see above).

## 2022-07-13 NOTE — Unmapped (Unsigned)
Called patient prior to virtual visit. Reviewed allergies, medications and pharmacy with patient on the phone. Answered all questions regarding virtual appointment. Patient verbalized time for appt. No other questions or concerns voiced. Provider was on site and pt was at home for this video appt.

## 2022-07-15 NOTE — Unmapped (Signed)
It was a pleasure to see you in clinic today.    For appointments & questions Monday through Friday 8 AM--5 PM     Please call [phone] or Toll free [phone]    For urgent clinical needs on Nights, Weekends or Holidays:  Call [phone] and ask for the oncologist on call.     For appointment changes please contact during normal business hours.     Please visit [email or web address], a resource created just for family members and caregivers.  This website lists support services, how and where to ask for help. It has tools to assist you as you help us care for your loved one.    Telisha Zawadzki, MD, MPH  Assistant Professor  Bone and Soft Tissue Oncology Program  Nurse Navigator: Stephanie Shea    [facility]  [address]  [place], [place] [address]  [email or web address]

## 2022-07-16 NOTE — Unmapped (Signed)
[facility] Specialty Pharmacy Refill Coordination Note    Specialty Medication(s) to be Shipped:   Hematology/Oncology: Nexavar    Other medication(s) to be shipped: No additional medications requested for fill at this time     Heidi Woodard, DOB: 1970-12-26  Phone: [phone] (home) [phone] (work)      All above HIPAA information was verified with patient.     Was a Nurse, learning disability used for this call? No    Completed refill call assessment today to schedule patient's medication shipment from the [facility] [phone]).  All relevant notes have been reviewed.     Specialty medication(s) and dose(s) confirmed: Regimen is correct and unchanged.   Changes to medications: Sadeen reports no changes at this time.  Changes to insurance: No  New side effects reported not previously addressed with a pharmacist or physician: None reported  Questions for the pharmacist: No    Confirmed patient received a Conservation officer, historic buildings and a Surveyor, mining with first shipment. The patient will receive a drug information handout for each medication shipped and additional FDA Medication Guides as required.       DISEASE/MEDICATION-SPECIFIC INFORMATION        N/A    SPECIALTY MEDICATION ADHERENCE     Medication Adherence    Patient reported X missed doses in the last month: 0  Specialty Medication: Nexavar 200 mg  Patient is on additional specialty medications: No  Informant: patient                       Were doses missed due to medication being on hold? No    Nexavar 200 mg: 11 days of medicine on hand       REFERRAL TO PHARMACIST     Referral to the pharmacist: Not needed      [facility]     Shipping address confirmed in Epic.     Delivery Scheduled: Yes, Expected medication delivery date: 07/22/22.     Medication will be delivered via UPS to the prescription address in Epic [place].    [name]   [facility] Specialty Technician

## 2022-07-19 MED ORDER — MORPHINE ER 15 MG TABLET,EXTENDED RELEASE
ORAL_TABLET | Freq: Every evening | ORAL | 0 refills | 30 days | Status: CP
Start: 2022-07-19 — End: ?

## 2022-07-19 MED ORDER — OXYCODONE 5 MG TABLET
ORAL_TABLET | Freq: Four times a day (QID) | ORAL | 0 refills | 25 days | Status: CP | PRN
Start: 2022-07-19 — End: 2022-08-18

## 2022-07-19 NOTE — Unmapped (Signed)
Returned call to Heidi Woodard to let her know that Dr. Meredith Mody sent in a new script to the pharmacy as requested. No further needs at this time.

## 2022-07-19 NOTE — Unmapped (Signed)
Hi,    Patient Heidi Woodard called requesting a medication refill for the following:    Medication: oxycodone  Dosage: 5mg   Days left of medication: 0  Pharmacy: [id number]    Medication: Morphine  Dosage: 15mg   Days left of medication: 0  Pharmacy: [facility] [phone]    The expected turnaround time is 3-4 business days       Thank you,  Durward Fortes  [facility] Cancer Communication Center  [phone]

## 2022-07-20 MED ORDER — OXYCODONE 5 MG TABLET
ORAL_TABLET | Freq: Four times a day (QID) | ORAL | 0 refills | 25 days | Status: CP | PRN
Start: 2022-07-20 — End: 2022-08-19

## 2022-07-20 NOTE — Unmapped (Signed)
I spoke with patient Heidi Woodard to confirm appointments on the following date(s): Scan location, labs  doctor's visit all confirmed for December 19th.     Dickey Gave

## 2022-07-20 NOTE — Unmapped (Signed)
.  Hi,    Patient Heidi Woodard called requesting a medication refill for the following:    Medication: Oxycodone  Dosage: 5mg   Days left of medication: 27  Pharmacy: [facility]                                 [address] [name]                                  [address]                                 PHONE: [phone]  FAX: [phone]    Patient is calling in stating that she needed for Dr. Meredith Mody to put in a new prescription for her to get the rest of her oxycodone pills she was able to get some but not all of the pills. Please send in a new prescription for the patient.     Please contact the patient back at [phone]    The expected turnaround time is 3-4 business days       Thank you,  Noland Fordyce  [facility] Cancer Communication Center  [phone]

## 2022-07-21 MED FILL — SORAFENIB 200 MG TABLET: ORAL | 30 days supply | Qty: 30 | Fill #4

## 2022-08-10 NOTE — Unmapped (Signed)
[facility] Pharmacy Refill Coordination Note    Specialty Medication(s) to be Shipped:   Hematology/Oncology: Nexavar    Other medication(s) to be shipped: No additional medications requested for fill at this time     Heidi Woodard, DOB: 1970-12-23  Phone: [phone] (home) [phone] (work)      All above HIPAA information was verified with patient.     Was a Nurse, learning disability used for this call? No    Completed refill call assessment today to schedule patient's medication shipment from the [facility] [phone]).  All relevant notes have been reviewed.     Specialty medication(s) and dose(s) confirmed: Regimen is correct and unchanged.   Changes to medications: Heidi Woodard reports no changes at this time.  Changes to insurance: No  New side effects reported not previously addressed with a pharmacist or physician: None reported  Questions for the pharmacist: No    Confirmed patient received a Conservation officer, historic buildings and a Surveyor, mining with first shipment. The patient will receive a drug information handout for each medication shipped and additional FDA Medication Guides as required.       DISEASE/MEDICATION-SPECIFIC INFORMATION        N/A    SPECIALTY MEDICATION ADHERENCE     Medication Adherence    Patient reported X missed doses in the last month: 0  Specialty Medication: Sorafenib 200mg   Patient is on additional specialty medications: No  Informant: patient                       Were doses missed due to medication being on hold? No    Nexavar 200 mg: 14 days of medicine on hand       REFERRAL TO PHARMACIST     Referral to the pharmacist: Not needed      [facility]     Shipping address confirmed in Epic.     Delivery Scheduled: Yes, Expected medication delivery date: 08/20/22.     Medication will be delivered via UPS to the prescription address in Epic [place].    Heidi Woodard   [facility] Specialty Technician

## 2022-08-16 MED ORDER — OXYCODONE 5 MG TABLET
ORAL_TABLET | Freq: Four times a day (QID) | ORAL | 0 refills | 25 days | Status: CP | PRN
Start: 2022-08-16 — End: 2022-09-15

## 2022-08-16 MED ORDER — MORPHINE ER 15 MG TABLET,EXTENDED RELEASE
ORAL_TABLET | Freq: Every evening | ORAL | 0 refills | 30 days | Status: CP
Start: 2022-08-16 — End: ?

## 2022-08-16 NOTE — Unmapped (Signed)
Hi,    Patient Heidi Woodard called requesting a medication refill for the following:    Medication: morphine (MS CONTIN)   Dosage: 15 MG 12 hr tablet    Days left of medication: 6  Pharmacy: [facility] [address] [place] - [address]  [address], [name] [address]  Phone: [phone]  Fax: [phone]      Medication: oxyCODONE (ROXICODONE)   Dosage: 5 MG immediate release tablet   Days left of medication: 8  Pharmacy: [facility] [address] [place] - [address]  [address], [place] [address]  Phone: [phone]  Fax: [phone]          The expected turnaround time is 3-4 business days         Thank you,  Warnell Bureau  [facility] Cancer Communication Center  [phone]

## 2022-08-16 NOTE — Unmapped (Signed)
Returned call to pt. Let her know refills were sent in as requested. Asked her to call back with any further needs.

## 2022-08-19 MED FILL — SORAFENIB 200 MG TABLET: ORAL | 30 days supply | Qty: 30 | Fill #5

## 2022-08-23 MED ORDER — MORPHINE ER 15 MG TABLET,EXTENDED RELEASE
ORAL_TABLET | Freq: Every evening | ORAL | 0 refills | 30 days | Status: CP
Start: 2022-08-23 — End: ?

## 2022-08-23 NOTE — Unmapped (Signed)
Hi,    Patient Heidi Woodard called requesting a medication refill for the following:    Medication: MS Contin  Dosage: 15mg   Days left of medication: 0  Pharmacy: [facility]      The expected turnaround time is 3-4 business days       Check Indicates criteria has been reviewed and confirmed with the patient:    [x]  Preferred Name   [x]  DOB and/or MR#  [x]  Preferred Contact Method  [x]  Phone Number(s)   [x]  Preferred Pharmacy   []  MyChart     Thank you,  [name]  [facility] Cancer Communication Center  [phone]

## 2022-08-30 ENCOUNTER — Ambulatory Visit: Payer: Medicare Other | Admitting: Family Medicine

## 2022-08-31 ENCOUNTER — Encounter: Payer: Self-pay | Admitting: Nurse Practitioner

## 2022-09-08 MED ORDER — OXYCODONE 5 MG TABLET
ORAL_TABLET | Freq: Four times a day (QID) | ORAL | 0 refills | 25 days | PRN
Start: 2022-09-08 — End: 2022-10-08

## 2022-09-09 MED ORDER — OXYCODONE 5 MG TABLET
ORAL_TABLET | Freq: Four times a day (QID) | ORAL | 0 refills | 25 days | Status: CP | PRN
Start: 2022-09-09 — End: 2022-10-09

## 2022-09-09 NOTE — Unmapped (Signed)
Hi,    Patient Heidi Woodard called requesting a medication refill for the following:    Medication: Oxycodone 5 MG  Days left of medication: 0  Pharmacy: Pharmacy    [facility] [address] [place] - [address]  [address], [place] [address]  Phone: [phone]  Fax: [phone]       Thank you,  Roseanne Kaufman  [facility] Cancer Communication Center  [phone]

## 2022-09-09 NOTE — Unmapped (Signed)
[facility] Special[facility] Pharmacy Refill Coordination Note    Specialty Medication(s) to be Shipped:   Hematology/Oncology: Nexavar    Other medication(s) to be shipped: No additional medications requested for fill at this time     Heidi Woodard, DOB: 1970/12/11  Phone: [phone] (home) [phone] (work)      All above HIPAA information was verified with patient.     Was a Nurse, learning disability used for this call? No    Completed refill call assessment today to schedule patient's medication shipment from the [facility] [phone]).  All relevant notes have been reviewed.     Specialty medication(s) and dose(s) confirmed: Regimen is correct and unchanged.   Changes to medications: Heidi Woodard reports no changes at this time.  Changes to insurance: No  New side effects reported not previously addressed with a pharmacist or physician: None reported  Questions for the pharmacist: No    Confirmed patient received a Conservation officer, historic buildings and a Surveyor, mining with first shipment. The patient will receive a drug information handout for each medication shipped and additional FDA Medication Guides as required.       DISEASE/MEDICATION-SPECIFIC INFORMATION        N/A    SPECIALTY MEDICATION ADHERENCE     Medication Adherence    Patient reported X missed doses in the last month: 0  Specialty Medication: Nexavar 200 mg  Patient is on additional specialty medications: No  Informant: patient                       Were doses missed due to medication being on hold? No    Nexavar 200 mg: 14 days of medicine on hand       REFERRAL TO PHARMACIST     Referral to the pharmacist: Not needed      [facility]     Shipping address confirmed in Epic.     Delivery Scheduled: Yes, Expected medication delivery date: 09/17/22.     Medication will be delivered via UPS to the prescription address in Epic [place].    [name]   [facility] Pharm[facility] Specialty Technician

## 2022-09-16 MED ORDER — MORPHINE ER 15 MG TABLET,EXTENDED RELEASE
ORAL_TABLET | Freq: Every evening | ORAL | 0 refills | 30 days
Start: 2022-09-16 — End: ?

## 2022-09-16 MED FILL — SORAFENIB 200 MG TABLET: ORAL | 30 days supply | Qty: 30 | Fill #6

## 2022-09-16 NOTE — Unmapped (Signed)
Hi,    Patient SHARDAE TILLERY called requesting a medication refill for the following:    Medication: Morphine  Dosage: 15 MG  Days left of medication: 0  Pharmacy:   [facility] [address], [place] - [address]  [address], [place] [address]  Phone: [phone]  Fax: [phone]       Thank you,  Roseanne Kaufman  [facility] Cancer Communication Center  [phone]

## 2022-09-17 MED ORDER — MORPHINE ER 15 MG TABLET,EXTENDED RELEASE
ORAL_TABLET | Freq: Every evening | ORAL | 0 refills | 30 days | Status: CP
Start: 2022-09-17 — End: ?

## 2022-09-20 NOTE — Unmapped (Signed)
Hi,    Patient Heidi Woodard called requesting a medication refill for the following:    Medication:   morphine (MS CONTIN) 15 MG 12 hr    Days left of medication: 4  Pharmacy:   [facility] [address] [place] - [address]  [address], [place] [address]  Phone: [phone]  Fax: [phone]     Thank you,  [name]  [facility] Cancer Communication Center  [phone]

## 2022-09-27 MED ORDER — OXYCODONE 5 MG TABLET
ORAL_TABLET | Freq: Four times a day (QID) | ORAL | 0 refills | 25 days | Status: CP | PRN
Start: 2022-09-27 — End: 2022-10-27

## 2022-09-28 NOTE — Unmapped (Signed)
Hi,    Patient Heidi Woodard called requesting a medication refill for the following:    Medication: oxyCODONE (ROXICODONE)   Dosage: 5 MG immediate release tablet   Days left of medication: 0  Pharmacy: [facility] [address], [place] - [address]  [address], [place] [address]  Phone: [phone]  Fax: [phone]      The expected turnaround time is 3-4 business days           Thank you,  Warnell Bureau  [facility] Cancer Communication Center  [phone]

## 2022-09-30 MED ORDER — OXYCODONE 5 MG TABLET
ORAL_TABLET | Freq: Four times a day (QID) | ORAL | 0 refills | 25 days | Status: CP | PRN
Start: 2022-09-30 — End: 2022-10-30

## 2022-09-30 NOTE — Unmapped (Signed)
.  Hi,    Patient Heidi Woodard called requesting a medication refill for the following:    Medication: Oxycodone  Dosage: 5mg    Days left of medication: 0  Pharmacy: [id number]    Patient is calling requesting a refill on her Oxycodone medication and nobody has called this in or contacted her or the pharmacy and she really needs her medication as she have none left.     Please contact the patient back [phone] asap    The expected turnaround time is 3-4 business days         Thank you,  Noland Fordyce  [facility] Communication [facility]  [phone]

## 2022-10-09 NOTE — Unmapped (Signed)
[facility] Refill Coordination Note    Specialty Medication(s) to be Shipped:   Hematology/Oncology: Nexavar    Other medication(s) to be shipped: No additional medications requested for fill at this time     Heidi Woodard, DOB: 06/18/1971  Phone: [phone] (home) [phone] (work)      All above HIPAA information was verified with patient.     Was a Nurse, learning disability used for this call? No    Completed refill call assessment today to schedule patient's medication shipment from the [facility] [phone]).  All relevant notes have been reviewed.     Specialty medication(s) and dose(s) confirmed: Regimen is correct and unchanged.   Changes to medications: Shekera reports no changes at this time.  Changes to insurance: No  New side effects reported not previously addressed with a pharmacist or physician: None reported  Questions for the pharmacist: No    Confirmed patient received a Conservation officer, historic buildings and a Surveyor, mining with first shipment. The patient will receive a drug information handout for each medication shipped and additional FDA Medication Guides as required.       DISEASE/MEDICATION-SPECIFIC INFORMATION        N/A    SPECIALTY MEDICATION ADHERENCE     Medication Adherence    Patient reported X missed doses in the last month: 0  Specialty Medication: Nexavar 200 mg  Patient is on additional specialty medications: No  Informant: patient                       Were doses missed due to medication being on hold? No    Nexavar 200 mg: 14 days of medicine on hand       REFERRAL TO PHARMACIST     Referral to the pharmacist: Not needed      [facility]     Shipping address confirmed in Epic.     Delivery Scheduled: Yes, Expected medication delivery date: 10/20/22.     Medication will be delivered via UPS to the prescription address in Epic [place].    [name]   [facility] Specialty Technician

## 2022-10-18 NOTE — Unmapped (Signed)
[facility] BONE AND SOFT TISSUE ONCOLOGY RETURN VISIT    Encounter Date: 10/19/2022  Patient Name: Heidi Woodard  Medical Record Number: [id number]    Referring Physician: Sheral Apley, MD  [address]  [facility] [address] [place],  [place] [address]    Primary Care Provider: Department, [facility]    DIAGNOSIS:  1. Desmoid fibromatosis    2. High risk medication use    3. Chronic left shoulder pain    4. 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 Continue prn topical steroids  - Continue urea cream on callouses on feet and also along neck.   - MRI Left shoulder, 02/08/20, similar to decreased bulk of shoulder mass  - 05/20/20: held sorafenib in anticipation of TKR on 05/26/20  - 08/04/20: MRI Left shoulder, similar size but increase in pain/swelling. Restarted sorafenib 200 mg daily.    - 12/12/20: Continues on sorafenib 200 mg daily, doing well. Improvement in desmoid associated pain, manageable with low dose opioids. HFS, HTN, nausea under control.  -02/13/21: Pain progressively worse over past few months, requiring increasing doses of opioids. MRI is stable/slightly better. However, given her pain, referred for cryoablation, Dr. Cathie Hoops feels it is amenable.  -05/29/21: Seen in follow up. Tolerating sorafenib well. Pain is stable on opioid regimen.  - 06/2021: cryoablation to the L shoulder.  -08/28/21: MRI shows essentially stable disease although minimal growth. Pain and range of motion are stable today. Continue stable dose of sorafenib. Will ask Dr. Cathie Hoops to review MRI if pain worsens.    -11/27/21: Pain stable. Labs good, continue sorafenib 200mg . Reviewed history, 400mg  dose not tolerated well.    03/23/22: Scans show decrease in size and enhancement of desmoid tumor. Continue sorafenib 200mg  daily. Consider treatment holiday at 2 years after cryoablation (06/2023)    07/13/22: Doing well. No concern for progression at present. Continue sorafenib 200mg  daily, scans in 3 mos.    10/19/22: Scans personally reviewed, interpreted and discussed with the patient, which show stable disease. Continue sorafenib 200mg  daily. She does feel her pain is modestly worsening. Referred to PM&R as the pain is also in the contralateral shoulder, which may be a function/rehab issue. Consider treatment holiday in another 6-8 mos (2 years since last progression).     -Possible future options if progression: doxil, sorafenib dose escalation (likely will not be well tolerated), pazopanib, nirogestat.     Essential HTN - likely exacerbated by sorafenib  Generally ~120/80.  - continue hydrochlorothiazide/lisinopril     Desmoid tumor associated pain - improved with sorafenib, then a bit worse. Had follow up visit in May for discussion about safe prescribing, adherence to dose as prescribed, inability to do any more early refills, as she had been requesting several early refills due to escalating pain. Now stable for several months, adherent to pain management agreement.     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 Advised to escalate use of emollients.  - continue urea 10-20% cream - escalate to TID given calluses on hands  - continue aggressive emollient use to prevent dry skin  Birth control: Says she is not sexually active. Aware of teratogenic risk of pregnancy on sorafenib.     Dispo:  RTC 3 mos without imaging    I personally reviewed the medical records, pathology and laboratory results and viewed the imaging. All questions were answered to the patient's apparent satisfaction and they voiced understanding and agreement with the plan. Pt has my card and contact information and is encouraged to reach out with any further questions.    Donzetta Sprung, MD/MPH  Assistant Professor  Bone and Soft Tissue Oncology Program  [facility]  Pager: [phone]  Nurse Navigator: [name] RN, BSN, OCN      REASON FOR CONSULTATION:   Heidi Woodard is a 51 y.o. female who is seen in consultation at the request of Sheral Apley, MD for evaluation of her desmoid fibromatosis.      HISTORY OF PRESENT ILLNESS:      In 2016, pt developed shoulder pain associated with a fall. There was bruising and swelling but she attributed it to the injury. It never improved, and by summer she noted increased swelling and decreased ROM (which is limited by pain, rather than painless weakness). 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 She underwent CT of the left shoudler on 06/25/16 which showed a 16 cm mass involing the left deltoid with likely invasion of the left trapezius, triceps, and teres minor. Possible LN involvement of axillary and supraclav LN on left were present. On 07/07/2016 she saw Dr. Darral Dash and ultrasound-guided biopsy was performed with pathology revealing low-grade spindle cell lesion favoring fibromatosis. He felt that surgical resection would be highly morbid. On 9/20/017, she saw Dr Beckey Rutter, who did not favor RT due to inherent risks associated with RT.      She has ongoing pain, some tingling along the backs of her hands. Pain is intermittent if her arm is at rest, happening about every other day but when it happens it is very intense. Per above it limits her ADLs although she says she is managing.     Interval history:   Pain is okay, but flared today with the MRI. Some days feeling more drained, sometimes needs more sleep. Maybe lasts for a week or two, but sometimes energy level is good. Partner chimes in that he sees her slowly declining over time, worsening pain and symptoms.    REVIEW OF SYSTEMS:  A comprehensive review of 12 systems was negative except for pertinent positives noted in HPI.    Past Medical History, Surgical History, Family History were reviewed personally. Any changes were updated as above.    Social History:  Lives in [place], [place]  Has a supportive boyfriend  No t/e/d  8 year old daughter    Pregnancy status: perimenopausal, not sexually active    ALLERGIES/MEDICATIONS:  Reviewed in Epic    PHYSICAL EXAM:   BP (S) 183/116 Comment: asymptomatic/provider aware - Pulse 75  - Temp 36.4 ??C (97.5 ??F) (Temporal)  - Ht 157.5 cm (5' 2)  - Wt (!) 105.3 kg (232 lb 1.6 oz)  - SpO2 99%  - BMI 42.45 kg/m??   ECOG 1  Gen - well appearing, in no acute distress  Eyes - conjunctivae clear, PERRL  ENT - mask in place  Lymph - no cervical or supraclavicular lymphadenopathy appreciated  Resp - clear to auscultation bilaterally, no wheezes, crackles or rales  CV - normal rate, regular rhythm, no murmur appreciated, no lower extremity edema noted  GI - abdomen soft, nontender, nondistended, no masses, bowel sounds normal  Skin - no rashes, no lesions noted  Neuro - AOx3, EOM intact, no facial droop, speech fluent and coherent, moves all extremities without asymmetry  Psych - affect normal, mood okay  MSK - left shoulder mass, ill defined but palpable, minimal tenderness.     PATHOLOGY:   Pathology was personally reviewed as described in the HPI, detailed in Epic.     LABS:  Reviewed in Epic    RADIOLOGY:  Imaging was personally viewed and interpreted as summarized in the history (see above).

## 2022-10-19 ENCOUNTER — Ambulatory Visit: Admit: 2022-10-19 | Discharge: 2022-10-19 | Payer: MEDICARE

## 2022-10-19 ENCOUNTER — Other Ambulatory Visit: Admit: 2022-10-19 | Discharge: 2022-10-19 | Payer: MEDICARE

## 2022-10-19 LAB — CBC W/ AUTO DIFF
BASOPHILS ABSOLUTE COUNT: 0 10*9/L (ref 0.0–0.1)
BASOPHILS RELATIVE PERCENT: 0.4 %
EOSINOPHILS ABSOLUTE COUNT: 0.4 10*9/L (ref 0.0–0.5)
EOSINOPHILS RELATIVE PERCENT: 5.6 %
HEMATOCRIT: 39.2 % (ref 34.0–44.0)
HEMOGLOBIN: 13.4 g/dL (ref 11.3–14.9)
LYMPHOCYTES ABSOLUTE COUNT: 2.9 10*9/L (ref 1.1–3.6)
LYMPHOCYTES RELATIVE PERCENT: 38.2 %
MEAN CORPUSCULAR HEMOGLOBIN CONC: 34.2 g/dL (ref 32.0–36.0)
MEAN CORPUSCULAR HEMOGLOBIN: 27.6 pg (ref 25.9–32.4)
MEAN CORPUSCULAR VOLUME: 80.7 fL (ref 77.6–95.7)
MEAN PLATELET VOLUME: 7.8 fL (ref 6.8–10.7)
MONOCYTES ABSOLUTE COUNT: 0.4 10*9/L (ref 0.3–0.8)
MONOCYTES RELATIVE PERCENT: 4.9 %
NEUTROPHILS ABSOLUTE COUNT: 3.9 10*9/L (ref 1.8–7.8)
NEUTROPHILS RELATIVE PERCENT: 50.9 %
PLATELET COUNT: 265 10*9/L (ref 150–450)
RED BLOOD CELL COUNT: 4.85 10*12/L (ref 3.95–5.13)
RED CELL DISTRIBUTION WIDTH: 17 % — ABNORMAL HIGH (ref 12.2–15.2)
WBC ADJUSTED: 7.7 10*9/L (ref 3.6–11.2)

## 2022-10-19 LAB — TSH: THYROID STIMULATING HORMONE: 1.705 u[IU]/mL (ref 0.550–4.780)

## 2022-10-19 LAB — COMPREHENSIVE METABOLIC PANEL
ALBUMIN: 3.8 g/dL (ref 3.4–5.0)
ALKALINE PHOSPHATASE: 88 U/L (ref 46–116)
ALT (SGPT): 20 U/L (ref 10–49)
ANION GAP: 10 mmol/L (ref 5–14)
AST (SGOT): 36 U/L — ABNORMAL HIGH (ref <=34)
BILIRUBIN TOTAL: 0.5 mg/dL (ref 0.3–1.2)
BLOOD UREA NITROGEN: 13 mg/dL (ref 9–23)
BUN / CREAT RATIO: 19
CALCIUM: 9.1 mg/dL (ref 8.7–10.4)
CHLORIDE: 105 mmol/L (ref 98–107)
CO2: 25 mmol/L (ref 20.0–31.0)
CREATININE: 0.67 mg/dL
EGFR CKD-EPI (2021) FEMALE: >90 mL/min/{1.73_m2} (ref >=60)
GLUCOSE RANDOM: 88 mg/dL (ref 70–179)
POTASSIUM: 3.7 mmol/L (ref 3.4–4.8)
PROTEIN TOTAL: 7.8 g/dL (ref 5.7–8.2)
SODIUM: 140 mmol/L (ref 135–145)

## 2022-10-19 LAB — PHOSPHORUS: PHOSPHORUS: 2.5 mg/dL (ref 2.4–5.1)

## 2022-10-19 LAB — MAGNESIUM: MAGNESIUM: 1.9 mg/dL (ref 1.6–2.6)

## 2022-10-19 LAB — T4, FREE: FREE T4: 1.2 ng/dL (ref 0.89–1.76)

## 2022-10-19 MED ORDER — MORPHINE ER 15 MG TABLET,EXTENDED RELEASE
ORAL_TABLET | Freq: Every evening | ORAL | 0 refills | 30 days | Status: CP
Start: 2022-10-19 — End: ?

## 2022-10-19 MED ADMIN — gadobenate dimeglumine (MULTIHANCE) 529 mg/mL (0.1mmol/0.2mL) solution 15 mL: 15 mL | INTRAVENOUS | @ 15:00:00 | Stop: 2022-10-19

## 2022-10-19 MED FILL — SORAFENIB 200 MG TABLET: ORAL | 90 days supply | Qty: 90 | Fill #7

## 2022-10-19 NOTE — Unmapped (Signed)
It was a pleasure to see you in clinic today.    For appointments & questions Monday through Friday 8 AM--5 PM     Please call [phone] or Toll free [phone]    For urgent clinical needs on Nights, Weekends or Holidays:  Call [phone] and ask for the oncologist on call.     For appointment changes please contact during normal business hours.     Please visit [email or web address], a resource created just for family members and caregivers.  This website lists support services, how and where to ask for help. It has tools to assist you as you help us care for your loved one.    Clearnce Leja, MD, MPH  Assistant Professor  Bone and Soft Tissue Oncology Program  Nurse Navigator: Stephanie Shea    [facility]  [address]  [place], [place] [address]  [email or web address]

## 2022-10-21 NOTE — Unmapped (Signed)
Addended by: Larina Bras on: 10/21/2022 12:13 PM     Modules accepted: Level of Service

## 2022-11-02 MED ORDER — OXYCODONE 5 MG TABLET
ORAL_TABLET | Freq: Four times a day (QID) | ORAL | 0 refills | 25 days | Status: CP | PRN
Start: 2022-11-02 — End: 2022-12-02

## 2022-11-02 NOTE — Unmapped (Signed)
Addended by: Larina Bras on: 11/02/2022 12:34 PM     Modules accepted: Orders

## 2022-11-02 NOTE — Unmapped (Signed)
Called pt to let her know refill was sent in as requested.Asked that she call back with any further needs.

## 2022-11-02 NOTE — Unmapped (Signed)
Addended by: Tresa Res on: 11/02/2022 09:54 AM     Modules accepted: Orders

## 2022-11-02 NOTE — Unmapped (Signed)
Hi,    Patient Heidi Woodard called requesting a medication refill for the following:    Medication: Oxycodone  Dosage: 5mg   Days left of medication: 0  Pharmacy: [facility]      The expected turnaround time is 3-4 business days         Thank you,  Durward Fortes  [facility] Cancer Communication Center  [phone]

## 2022-11-16 MED ORDER — MORPHINE ER 15 MG TABLET,EXTENDED RELEASE
ORAL_TABLET | Freq: Every evening | ORAL | 0 refills | 30 days | Status: CP
Start: 2022-11-16 — End: ?

## 2022-11-16 NOTE — Unmapped (Signed)
Returned call to pt. Let her know the refill has been sent in as requested.She will call back with any further needs.

## 2022-11-16 NOTE — Unmapped (Signed)
.  Hi,    Patient Heidi Woodard called requesting a medication refill for the following:    Medication: Morphine  Dosage: 15mg   Days left of medication: 4 days  Pharmacy: [facility]                     [address]                      [facility] [place] [address]    Patient Heidi Woodard is calling into today requesting a medication refill be sent in to the     Please contact the patient back at [phone] asap.    The expected turnaround time is 3-4 business days       Thank you,  Noland Fordyce  [facility] Cancer Communication Center  [phone]

## 2022-11-18 ENCOUNTER — Ambulatory Visit: Payer: Medicare Other | Admitting: Family Medicine

## 2022-11-29 MED ORDER — OXYCODONE 5 MG TABLET
ORAL_TABLET | Freq: Four times a day (QID) | ORAL | 0 refills | 25.00000 days | Status: CP | PRN
Start: 2022-11-29 — End: 2022-12-29

## 2022-11-29 NOTE — Unmapped (Signed)
Burnadette Peter w/[facility] contacted the Communication Center requesting to speak with the care team of TANGIE CRAWFORD to discuss:    Unable to fill oxyCODONE (ROXICODONE) 5 MG  due to prescription instructions saying to fill on 5/5    Please contact [facility] [address], [place] - [address]  [address], [place] [address]  Phone: [phone]  Fax: [phone]      Thank you,   [name]  [facility] Cancer Communication Center   [phone]

## 2022-11-29 NOTE — Unmapped (Signed)
Hi,    Patient Heidi Woodard called requesting a medication refill for the following:    Medication: Oxycodone   Dosage: 5mg   Days left of medication: 0  Pharmacy:   [facility] [address] [place] - [address]  [address], [place] [address]  Phone: [phone]  Fax: [phone]       The expected turnaround time is 3-4 business days       Check Indicates criteria has been reviewed and confirmed with the patient:    [x]  Preferred Name   [x]  DOB and/or MR#  [x]  Preferred Contact Method  [x]  Phone Number(s)   [x]  Preferred Pharmacy       Thank you,  Iona Hansen  [facility] Cancer Communication Center  [phone]

## 2022-12-14 MED ORDER — MORPHINE ER 15 MG TABLET,EXTENDED RELEASE
ORAL_TABLET | Freq: Every evening | ORAL | 0 refills | 30 days | Status: CP
Start: 2022-12-14 — End: ?

## 2022-12-14 NOTE — Unmapped (Signed)
Hi,    Patient Heidi Woodard called requesting a medication refill for the following:    Medication: morphine (MS CONTIN)   Dosage: 15 MG 12 hr tablet   Days left of medication: 6  Pharmacy:   [facility] [address], [place] - [address]  [address], [place] [address]  Phone: [phone]  Fax: [phone]           The expected turnaround time is 3-4 business days           Thank you,  Warnell Bureau  [facility] Cancer Communication Center  [phone]

## 2022-12-15 NOTE — Unmapped (Signed)
Returned call to Heidi Woodard to make aware that the medication that was requested was sent into the pharmacy on file.     No further needs at this time

## 2022-12-20 MED ORDER — OXYCODONE 5 MG TABLET
ORAL_TABLET | Freq: Four times a day (QID) | ORAL | 0 refills | 25 days | PRN
Start: 2022-12-20 — End: 2023-01-19

## 2022-12-20 NOTE — Unmapped (Signed)
Hi,    Patient VALYNCIA ZIRPOLI called requesting a medication refill for the following:    Medication: oxycodone  Dosage: 5mg   Days left of medication: 0  [facility]acy[facility]      The expected turnaround time is 3-4 business days       Check Indicates criteria has been reviewed and confirmed with the patient:    [x]  Preferred Name   [x]  DOB and/or MR#  [x]  Preferred Contact Method  [x]  Phone Number(s)   [x]  Preferred Pharmacy   []  MyChart     Thank you,  [name]  [facility] Cancer Communication Center  [phone]

## 2022-12-21 MED ORDER — OXYCODONE 5 MG TABLET
ORAL_TABLET | Freq: Four times a day (QID) | ORAL | 0 refills | 25 days | Status: CP | PRN
Start: 2022-12-21 — End: 2023-01-20

## 2022-12-21 NOTE — Unmapped (Signed)
Returned call to pt. Let her know her refill has been sent in as requested.Asked that she call back with any further needs.

## 2023-01-06 DIAGNOSIS — D48119 Desmoid fibromatosis: Principal | ICD-10-CM

## 2023-01-06 MED ORDER — SORAFENIB 200 MG TABLET
ORAL_TABLET | Freq: Every day | ORAL | 11 refills | 30 days
Start: 2023-01-06 — End: 2024-01-06

## 2023-01-06 NOTE — Unmapped (Signed)
[facility] Clinical Assessment & Refill Coordination Note    Heidi Woodard, DOB: [birth year]  Phone: [phone] (home) [phone] (work)    All above HIPAA information was verified with patient.     Was a Nurse, learning disability used for this call? No    Specialty Medication(s):   Hematology/Oncology: Nexavar     Current Outpatient Medications   Medication Sig Dispense Refill    celecoxib (CELEBREX) 200 MG capsule Take 1 capsule (200 mg total) by mouth in the morning.      cyclobenzaprine (FLEXERIL) 10 MG tablet Take 1 tablet (10 mg total) by mouth in the morning.      lisinopril-hydrochlorothiazide (PRINZIDE,ZESTORETIC) 20-25 mg per tablet Take 1 tablet by mouth daily.      morphine (MS CONTIN) 15 MG 12 hr tablet Take 1 tablet (15 mg total) by mouth nightly. 30 tablet 0    ondansetron (ZOFRAN) 8 MG tablet Take 1 tablet (8 mg total) by mouth every eight (8) hours as needed for nausea. 30 tablet 3    oxyCODONE (ROXICODONE) 5 MG immediate release tablet Take 1 tablet (5 mg total) by mouth every six (6) hours as needed for pain (moderate to severe pain). 100 tablet 0    potassium gluconate 2.5 mEq Tab Take 1 tablet by mouth.      simvastatin (ZOCOR) 40 MG tablet Take 1 tablet (40 mg total) by mouth nightly.      SORAfenib (NEXAVAR) 200 mg tablet Take 1 tablet (200 mg total) by mouth daily. Take on empty stomach. 30 tablet 11    therapeutic multivitamin (THERAGRAN) tablet Take 1 tablet by mouth daily.      urea (CARMOL) 10 % cream APPLY NIGHTLY TO AFFECTED PRESSURE CALLOUS ON FOOT 85 g 5    urea-alpha hydroxy acids 10-4 % Crea APPLY NIGHTLY TO AFFECTED PRESSURE CALLOUS ON FOOT       No current facility-administered medications for this visit.        Changes to medications: [facility] reports no changes at this time.    Allergies   Allergen Reactions    Augmentin [Amoxicillin-Pot Clavulanate] Hives    Penicillins Hives       Changes to allergies: No    SPECIALTY MEDICATION ADHERENCE     sorafenib 200 mg: 14 days of medicine on hand     Medication Adherence    Patient reported X missed doses in the last month: 0  Specialty Medication: sorafenib 200 mg once daiy  Patient is on additional specialty medications: No  Confirmed plan for next specialty medication refill: delivery by pharmacy  Refills needed for supportive medications: not needed          Specialty medication(s) dose(s) confirmed: Regimen is correct and unchanged.     Are there any concerns with adherence? No    Adherence counseling provided? Not needed    CLINICAL MANAGEMENT AND INTERVENTION      Clinical Benefit Assessment:    Do you feel the medicine is effective or helping your condition? Yes    Clinical Benefit counseling provided? Not needed    Adverse Effects Assessment:    Are you experiencing any side effects? No    Are you experiencing difficulty administering your medicine? No    Quality of Life Assessment:    Quality of Life    Rheumatology  Oncology  1. What impact has your specialty medication had on the reduction of your daily pain or discomfort level?: None  2. On  a scale of 1-10, how would you rate your ability to manage side effects associated with your specialty medication? (1=no issues, 10 = unable to take medication due to side effects): 1  Dermatology  Cystic Fibrosis          How many days over the past month did your desmoid fibromatosis  keep you from your normal activities? For example, brushing your teeth or getting up in the morning. 0    Have you discussed this with your provider? Not needed    Acute Infection Status:    Acute infections noted within Epic:  No active infections  Patient reported infection: None    Therapy Appropriateness:    Is therapy appropriate and patient progressing towards therapeutic goals? Yes, therapy is appropriate and should be continued    DISEASE/MEDICATION-SPECIFIC INFORMATION      N/A    Oncology: Is the patient receiving adequate infection prevention treatment? Not applicable  Does the patient have adequate nutritional support? Not applicable    PATIENT SPECIFIC NEEDS     Does the patient have any physical, cognitive, or cultural barriers? No    Is the patient high risk? No    Did the patient require a clinical intervention? No    Does the patient require physician intervention or other additional services (i.e., nutrition, smoking cessation, social work)? No    SOCIAL DETERMINANTS OF HEALTH     At the [facility], we have learned that life circumstances - like trouble affording food, housing, utilities, or transportation can affect the health of many of our patients.   That is why we wanted to ask: are you currently experiencing any life circumstances that are negatively impacting your health and/or quality of life? Patient declined to answer    Social Determinants of Health     Financial Resource Strain: Not on file   Internet Connectivity: Not on file   Food Insecurity: Not on file   Tobacco Use: Low Risk  (11/27/2021)    Patient History     Smoking Tobacco Use: Never     Smokeless Tobacco Use: Never     Passive Exposure: Not on file   Housing/Utilities: Not on file   Alcohol Use: Not on file   Transportation Needs: Not on file   Substance Use: Not on file   Health Literacy: Not on file   Physical Activity: Not on file   Interpersonal Safety: Not on file   Stress: Not on file   Intimate Partner Violence: Unknown (02/05/2022)    Received from [facility]    HITS     Physically Hurt: Not on file     Insult or Talk Down To: Not on file     Threaten Physical Harm: Not on file     Scream or Curse: Not on file   Depression: Not at risk (03/26/2020)    Received from [facility]    Depression     Depression Screening: 0   Social Connections: Unknown (03/16/2022)    Received from [employer]    Social Network     Social Network: Not on file     Would you be willing to receive help with any of the needs that you have identified today? Not applicable       SHIPPING     Specialty Medication(s) to be Shipped:   Hematology/Oncology: Nexavar    Other medication(s) to be shipped: No additional medications requested for fill at this time  Changes to insurance: No    Patient was informed of new phone menu:  no changes to oncology phone menu    Delivery Scheduled: Yes, Expected medication delivery date: 01/18/23.     Medication will be delivered via UPS to the confirmed prescription address in [facility].    The patient will receive a drug information handout for each medication shipped and additional FDA Medication Guides as required.  Verified that patient has previously received a Conservation officer, historic buildings and a Surveyor, mining.    The patient or caregiver noted above participated in the development of this care plan and knows that they can request review of or adjustments to the care plan at any time.      All of the patient's questions and concerns have been addressed.    Kermit Balo, [facility]   [facility] Specialty Pharmacist

## 2023-01-07 MED ORDER — SORAFENIB 200 MG TABLET
ORAL_TABLET | Freq: Every day | ORAL | 11 refills | 30 days | Status: CP
Start: 2023-01-07 — End: 2024-01-07
  Filled 2023-01-17: qty 30, 30d supply, fill #0

## 2023-01-10 MED ORDER — OXYCODONE 5 MG TABLET
ORAL_TABLET | Freq: Four times a day (QID) | ORAL | 0 refills | 25 days | Status: CP | PRN
Start: 2023-01-10 — End: 2023-02-09

## 2023-01-10 MED ORDER — MORPHINE ER 15 MG TABLET,EXTENDED RELEASE
ORAL_TABLET | Freq: Every evening | ORAL | 0 refills | 30 days | Status: CP
Start: 2023-01-10 — End: ?

## 2023-01-10 NOTE — Unmapped (Signed)
Returned call to pt. Let her know her refills was sent in as requested.Asked her to call back with any further needs.

## 2023-01-10 NOTE — Unmapped (Signed)
Hi,    Patient Heidi Woodard called requesting a medication refill for the following:    Medication: Morphine, Oxycodone  Dosage: 15 mg, 5mg   Days left of medication: 0  Pharmacy: [facility]      The expected turnaround time is 3-4 business days       Check Indicates criteria has been reviewed and confirmed with the patient:    [x]  Preferred Name   [x]  DOB and/or MR#  [x]  Preferred Contact Method  [x]  Phone Number(s)   [x]  Preferred Pharmacy   []  MyChart     Thank you,  [name]  [facility] Cancer Communication Center  [phone]

## 2023-01-17 NOTE — Unmapped (Unsigned)
[facility] BONE AND SOFT TISSUE ONCOLOGY RETURN VISIT    Encounter Date: 01/18/2023  Patient Name: Heidi Woodard  Medical Record Number: [id number]    Referring Physician: Sheral Apley, MD  [address]  [facility] Oncology Div  [place],  [place] [address]    Primary Care Provider: Department, [facility]    DIAGNOSIS:  No diagnosis found.        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 Continue prn topical steroids  - Continue urea cream on callouses on feet and also along neck.   - MRI Left shoulder, 02/08/20, similar to decreased bulk of shoulder mass  - 05/20/20: held sorafenib in anticipation of TKR on 05/26/20  - 08/04/20: MRI Left shoulder, similar size but increase in pain/swelling. Restarted sorafenib 200 mg daily.    - 12/12/20: Continues on sorafenib 200 mg daily, doing well. Improvement in desmoid associated pain, manageable with low dose opioids. HFS, HTN, nausea under control.  -02/13/21: Pain progressively worse over past few months, requiring increasing doses of opioids. MRI is stable/slightly better. However, given her pain, referred for cryoablation, Dr. Cathie Hoops feels it is amenable.  -05/29/21: Seen in follow up. Tolerating sorafenib well. Pain is stable on opioid regimen.  - 06/2021: cryoablation to the L shoulder.  -08/28/21: MRI shows essentially stable disease although minimal growth. Pain and range of motion are stable today. Continue stable dose of sorafenib. Will ask Dr. Cathie Hoops to review MRI if pain worsens.    -11/27/21: Pain stable. Labs good, continue sorafenib 200mg . Reviewed history, 400mg  dose not tolerated well.    03/23/22: Scans show decrease in size and enhancement of desmoid tumor. Continue sorafenib 200mg  daily. Consider treatment holiday at 2 years after cryoablation (06/2023)    07/13/22: Doing well. No concern for progression at present. Continue sorafenib 200mg  daily, scans in 3 mos.    10/19/22: Scans show stable disease. Continue sorafenib 200mg  daily. She feels her pain is modestly worsening. Referred to PM&R as the pain is also in the contralateral shoulder, which may be a function/rehab issue. Consider treatment holiday in another 6-8 mos (2 years since last progression).    01/18/23:   Assessment & Plan       -Possible future options if progression: doxil, sorafenib dose escalation (likely will not be well tolerated), pazopanib, nirogestat.     Essential HTN - likely exacerbated by sorafenib  Generally ~120/80.  - continue hydrochlorothiazide/lisinopril     Desmoid tumor associated pain - improved with sorafenib, then a bit worse. Had follow up visit in May for discussion about safe prescribing, adherence to dose as prescribed, inability to do any more early refills, as she had been requesting several early refills due to escalating pain. Now stable for several months, adherent to pain management agreement.     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 Advised to escalate use of emollients.  - continue urea 10-20% cream - escalate to TID given calluses on hands  - continue aggressive emollient use to prevent dry skin     Birth control: Says she is not sexually active. Aware of teratogenic risk of pregnancy on sorafenib.     Dispo:  RTC  3 mos without imaging    I personally reviewed the medical records, pathology and laboratory results and viewed the imaging. All questions were answered to the patient's apparent satisfaction and they voiced understanding and agreement with the plan. Pt has my card and contact information and is encouraged to reach out with any further questions.    Donzetta Sprung, MD/MPH  Assistant Professor  Bone and Soft Tissue Oncology Program  [facility]  Pager: [phone]  Nurse Navigator: [name] RN, BSN, OCN      REASON FOR CONSULTATION:   Heidi Woodard is a 52 y.o. female who is seen in consultation at the request of Sheral Apley, MD for evaluation of her desmoid fibromatosis.      HISTORY OF PRESENT ILLNESS:      In 2016, pt developed shoulder pain associated with a fall. There was bruising and swelling but she attributed it to the injury. It never improved, and by summer she noted increased swelling and decreased ROM (which is limited by pain, rather than painless weakness). 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 She underwent CT of the left shoudler on 06/25/16 which showed a 16 cm mass involing the left deltoid with likely invasion of the left trapezius, triceps, and teres minor. Possible LN involvement of axillary and supraclav LN on left were present. On 07/07/2016 she saw Dr. Darral Dash and ultrasound-guided biopsy was performed with pathology revealing low-grade spindle cell lesion favoring fibromatosis. He felt that surgical resection would be highly morbid. On 9/20/017, she saw Dr Beckey Rutter, who did not favor RT due to inherent risks associated with RT.      She has ongoing pain, some tingling along the backs of her hands. Pain is intermittent if her arm is at rest, happening about every other day but when it happens it is very intense. Per above it limits her ADLs although she says she is managing.     Interval history:   History of Present Illness      REVIEW OF SYSTEMS:  A comprehensive review of 12 systems was negative except for pertinent positives noted in HPI.    Past Medical History, Surgical History, Family History were reviewed personally. Any changes were updated as above.    Social History:  Lives in [place], [place]  Has a supportive boyfriend  No t/e/d  43 year old daughter    Pregnancy status: perimenopausal, not sexually active    ALLERGIES/MEDICATIONS:  Reviewed in Epic    PHYSICAL EXAM:   There were no vitals taken for this visit.  ECOG 1  Gen - well appearing, in no acute distress  Eyes - conjunctivae clear, PERRL  ENT - mask in place  Lymph - no cervical or supraclavicular lymphadenopathy appreciated  Resp - clear to auscultation bilaterally, no wheezes, crackles or rales  CV - normal rate, regular rhythm, no murmur appreciated, no lower extremity edema noted  GI - abdomen soft, nontender, nondistended, no masses, bowel sounds normal  Skin - no rashes, no lesions noted  Neuro - AOx3, EOM intact, no facial droop, speech fluent and coherent, moves all extremities without asymmetry  Psych - affect normal, mood okay  MSK - left shoulder mass, ill defined but palpable, minimal tenderness.     PATHOLOGY:   Pathology was personally reviewed as described in the HPI, detailed in Epic.     LABS:  Reviewed in  Epic    RADIOLOGY:  Imaging was personally viewed and interpreted as summarized in the history (see above).

## 2023-02-03 NOTE — Unmapped (Unsigned)
[facility] BONE AND SOFT TISSUE ONCOLOGY RETURN VISIT    Encounter Date: 02/04/2023  Patient Name: Heidi Woodard  Medical Record Number: [id number]    Referring Physician: Sheral Apley, MD  [address]  [facility] Oncology Div  [place],  [place] [address]    Primary Care Provider: Department, [facility]    DIAGNOSIS:  No diagnosis found.        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 Continue prn topical steroids  - Continue urea cream on callouses on feet and also along neck.   - MRI Left shoulder, 02/08/20, similar to decreased bulk of shoulder mass  - 05/20/20: held sorafenib in anticipation of TKR on 05/26/20  - 08/04/20: MRI Left shoulder, similar size but increase in pain/swelling. Restarted sorafenib 200 mg daily.    - 12/12/20: Continues on sorafenib 200 mg daily, doing well. Improvement in desmoid associated pain, manageable with low dose opioids. HFS, HTN, nausea under control.  -02/13/21: Pain progressively worse over past few months, requiring increasing doses of opioids. MRI is stable/slightly better. However, given her pain, referred for cryoablation, Dr. Cathie Hoops feels it is amenable.  -05/29/21: Seen in follow up. Tolerating sorafenib well. Pain is stable on opioid regimen.  - 06/2021: cryoablation to the L shoulder.  -08/28/21: MRI shows essentially stable disease although minimal growth. Pain and range of motion are stable today. Continue stable dose of sorafenib. Will ask Dr. Cathie Hoops to review MRI if pain worsens.    -11/27/21: Pain stable. Labs good, continue sorafenib 200mg . Reviewed history, 400mg  dose not tolerated well.    03/23/22: Scans show decrease in size and enhancement of desmoid tumor. Continue sorafenib 200mg  daily. Consider treatment holiday at 2 years after cryoablation (06/2023)    07/13/22: Doing well. No concern for progression at present. Continue sorafenib 200mg  daily, scans in 3 mos.    10/19/22: Scans show stable disease. Continue sorafenib 200mg  daily. She feels her pain is modestly worsening. Referred to PM&R as the pain is also in the contralateral shoulder, which may be a function/rehab issue. Consider treatment holiday in another 6-8 mos (2 years since last progression).    02/04/23:   Assessment & Plan       -Possible future options if progression: doxil, sorafenib dose escalation (likely will not be well tolerated), pazopanib, nirogestat.     Essential HTN - likely exacerbated by sorafenib  Generally ~120/80.  - continue hydrochlorothiazide/lisinopril     Desmoid tumor associated pain - improved with sorafenib, then a bit worse. Had follow up visit in May for discussion about safe prescribing, adherence to dose as prescribed, inability to do any more early refills, as she had been requesting several early refills due to escalating pain. Now stable for several months, adherent to pain management agreement.     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 Advised to escalate use of emollients.  - continue urea 10-20% cream - escalate to TID given calluses on hands  - continue aggressive emollient use to prevent dry skin     Birth control: Says she is not sexually active. Aware of teratogenic risk of pregnancy on sorafenib.     Dispo:  RTC  3 mos with MRI    I personally reviewed the medical records, pathology and laboratory results and viewed the imaging. All questions were answered to the patient's apparent satisfaction and they voiced understanding and agreement with the plan. Pt has my card and contact information and is encouraged to reach out with any further questions.    Donzetta Sprung, MD/MPH  Assistant Professor  Bone and Soft Tissue Oncology Program  [facility]  Pager: [phone]  Nurse Navigator: [name] RN, BSN, OCN      REASON FOR CONSULTATION:   Heidi Woodard is a 52 y.o. female who is seen in consultation at the request of Sheral Apley, MD for evaluation of her desmoid fibromatosis.      HISTORY OF PRESENT ILLNESS:      In 2016, pt developed shoulder pain associated with a fall. There was bruising and swelling but she attributed it to the injury. It never improved, and by summer she noted increased swelling and decreased ROM (which is limited by pain, rather than painless weakness). 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 She underwent CT of the left shoudler on 06/25/16 which showed a 16 cm mass involing the left deltoid with likely invasion of the left trapezius, triceps, and teres minor. Possible LN involvement of axillary and supraclav LN on left were present. On 07/07/2016 she saw Dr. Darral Dash and ultrasound-guided biopsy was performed with pathology revealing low-grade spindle cell lesion favoring fibromatosis. He felt that surgical resection would be highly morbid. On 9/20/017, she saw Dr Beckey Rutter, who did not favor RT due to inherent risks associated with RT.      She has ongoing pain, some tingling along the backs of her hands. Pain is intermittent if her arm is at rest, happening about every other day but when it happens it is very intense. Per above it limits her ADLs although she says she is managing.     Interval history:   History of Present Illness      REVIEW OF SYSTEMS:  A comprehensive review of 12 systems was negative except for pertinent positives noted in HPI.    Past Medical History, Surgical History, Family History were reviewed personally. Any changes were updated as above.    Social History:  Lives in [place], [place]  Has a supportive boyfriend  No t/e/d  74 year old daughter    Pregnancy status: perimenopausal, not sexually active    ALLERGIES/MEDICATIONS:  Reviewed in Epic    PHYSICAL EXAM: There were no vitals taken for this visit.  ECOG 1  Gen - well appearing, in no acute distress  Eyes - conjunctivae clear, PERRL  ENT - mask in place  Lymph - no cervical or supraclavicular lymphadenopathy appreciated  Resp - clear to auscultation bilaterally, no wheezes, crackles or rales  CV - normal rate, regular rhythm, no murmur appreciated, no lower extremity edema noted  GI - abdomen soft, nontender, nondistended, no masses, bowel sounds normal  Skin - no rashes, no lesions noted  Neuro - AOx3, EOM intact, no facial droop, speech fluent and coherent, moves all extremities without asymmetry  Psych - affect normal, mood okay  MSK - left shoulder mass, ill defined but palpable, minimal tenderness.     PATHOLOGY:   Pathology was personally reviewed as described in the HPI, detailed in Epic.     LABS:  Reviewed in Epic  RADIOLOGY:  Imaging was personally viewed and interpreted as summarized in the history (see above).

## 2023-02-03 NOTE — Unmapped (Signed)
Hi,    Patient Heidi Woodard contacted the Communication Center to cancel their appointment for tomorrow.  The appointment has been rescheduled to 03/08/23.    Cancellation Reason: scheduling conflict    Thank you,  [name]  [facility]   [phone]

## 2023-02-09 MED ORDER — OXYCODONE 5 MG TABLET
ORAL_TABLET | Freq: Four times a day (QID) | ORAL | 0 refills | 25 days | Status: CP | PRN
Start: 2023-02-09 — End: 2023-03-11

## 2023-02-09 MED ORDER — MORPHINE ER 15 MG TABLET,EXTENDED RELEASE
ORAL_TABLET | Freq: Every evening | ORAL | 0 refills | 30 days | Status: CP
Start: 2023-02-09 — End: ?

## 2023-02-09 NOTE — Unmapped (Signed)
Hi,    Patient Heidi Woodard called requesting a medication refill for the following:    Medication: Oxycodone 5 MG and Morphone 15 MG  Days left of medication: 0  Pharmacy: Pharmacy    [facility] [place] [address] [place] - [address]  [address], [place] [address]  Phone: [phone]  Fax: [phone]       Thank you,  Roseanne Kaufman  [facility]  [phone]

## 2023-02-10 NOTE — Unmapped (Signed)
Returned call to pt. Let her know refills were sent in as requested.She will call back with any further needs.

## 2023-02-11 ENCOUNTER — Ambulatory Visit: Admit: 2023-02-11 | Discharge: 2023-02-12 | Payer: MEDICARE

## 2023-02-11 DIAGNOSIS — I10 Essential (primary) hypertension: Principal | ICD-10-CM

## 2023-02-11 MED ORDER — ATORVASTATIN 20 MG TABLET
ORAL_TABLET | Freq: Every day | ORAL | 3 refills | 90 days | Status: CP
Start: 2023-02-11 — End: 2024-02-11

## 2023-02-11 MED ORDER — LISINOPRIL 5 MG TABLET
ORAL_TABLET | Freq: Every day | ORAL | 0 refills | 90 days | Status: CP
Start: 2023-02-11 — End: 2024-02-11

## 2023-02-11 NOTE — Unmapped (Signed)
Notified provider of elevated blood pressure verbally.C Tisha Cline-RMA    Has never had a colonoscopy, [facility]. C Tay Whitwell-RMA    2nd blood pressure checked notified provider verbally.C Loel Betancur-RMA

## 2023-02-11 NOTE — Unmapped (Signed)
Basic progress  SUBJECTIVE  Establish Care (Pulling feeling under left breast after fall x 2 weeks ago. Previous PCP was [facility].C Grant-RMA) and Hypertension (Out of lisinopril-hydrochlorothiazide for the past 2 months.C Grant-RMA)    HPI  Patient endorses a history of hypertension.  She has not been able to take her medications as she has been out for approximately 2 months.  She denies any chest pain, shortness of breath, headaches, visual changes, muscle spasms, difficulty or change in urination, lower leg swelling.    Review of Systems: As per HPI     OBJECTIVE  PHYSICAL EXAM:  BP 170/114 Comment: right arm - Pulse 80  - Temp 36.9 ??C (98.4 ??F) (Temporal)  - Resp 16  - Ht 160.4 cm (5' 3.16)  - Wt (!) 105.1 kg (231 lb 9.6 oz)  - SpO2 95%  - Breastfeeding No  - BMI 40.82 kg/m??      Constitutional - stated age, well appearing, no acute distress     Eyes - Sclera - white, non injected     Cardiovascular - Regular rate and rhythm, +s1 and s2, No murmurs, rubs or gallops     Respiratory - Non-labored respirations, Clear to auscultation bilaterally, No wheezes, rales or rhonchi, symmetrical breath sounds and chest expansion     MSK - No visible or palpable edema     Neuro - CNII-XII grossly intact, sensation intact to light touch and temperature     Psych - AAOx3     ASSESSMENT AND PLAN:  Heidi Woodard is a 52 y.o. female presenting to the outpatient clinic for the following:   Diagnosis ICD-10-CM Associated Orders   1. Primary hypertension  I10         Above diagnosis based on HPI, Examination, Chart Review   Very poorly controlled, stage II to the hypertensive urgency range.  Thankfully there are no symptoms at this time.  We will need to slowly increase her medications back to getting her to goal.  Restart lisinopril at 5 mg daily.  She does not have significant lower extremity swelling at this time and a diuretic may not be needed and we may consider amlodipine instead.  She does not have a history of stroke or heart attack.  Check blood pressures a.m. and p.m. and follow-up in 1 week.  Last BMP was reviewed and normal.  She has a history of dyslipidemia and is not taking her simvastatin as she ran out.  Will switch her to atorvastatin and recheck labs in approximately 3 months.  Patient counseled on when to seek further medical attention.

## 2023-02-11 NOTE — Unmapped (Signed)
How to have healthy blood pressure  +Take your medicine as prescribed  +Maintain a healthy weight  +Keep working on weight loss.  Avoid sugary and oily/fatty foods  +Keep salt down less than 2 grams/2000 milligrams daily.  Read nutrition labels and keep salt less than 100% daily  +Exercise 10 mins a day or 1hour a week   Check your blood pressures a.m. and p.m. and write the numbers down.  Let our office know if they are higher than 130/90.

## 2023-02-18 ENCOUNTER — Ambulatory Visit: Admit: 2023-02-18 | Discharge: 2023-02-18 | Payer: MEDICARE

## 2023-02-18 ENCOUNTER — Institutional Professional Consult (permissible substitution): Admit: 2023-02-18 | Discharge: 2023-02-18 | Payer: MEDICARE

## 2023-02-18 DIAGNOSIS — R300 Dysuria: Principal | ICD-10-CM

## 2023-02-18 DIAGNOSIS — N3001 Acute cystitis with hematuria: Principal | ICD-10-CM

## 2023-02-18 DIAGNOSIS — I10 Essential (primary) hypertension: Principal | ICD-10-CM

## 2023-02-18 MED ORDER — NITROFURANTOIN MONOHYDRATE/MACROCRYSTALS 100 MG CAPSULE
ORAL_CAPSULE | Freq: Two times a day (BID) | ORAL | 0 refills | 7 days | Status: CP
Start: 2023-02-18 — End: 2023-02-25

## 2023-02-18 NOTE — Unmapped (Signed)
Assessment and Plan:     Dysuria  - POCT urinalysis dipstick    Acute cystitis with hematuria  - Urine Culture  - nitrofurantoin, macrocrystal-monohydrate, (MACROBID) 100 MG capsule; Take 1 capsule (100 mg total) by mouth two (2) times a day for 7 days.    Uncontrolled hypertension    Urine culture pending    Discussed risk of uncontrolled hypertension with patient at bedside.  Directed Heidi Woodard to keep a blood pressure log daily.  If pressures do not improve in the next 2 to 3 days follow-up with Heidi Woodard PCP.  If she becomes symptomatic go to the ED for further evaluation.  She will start lisinopril 10 mg daily today.    The following portions of the patient's history were reviewed and updated as appropriate: allergies, current medications, past family history, past medical history, past social history, past surgical history and problem list.    Subjective:     Patient ID: Heidi Woodard is a 52 y.o. female  Chief Complaint   Patient presents with    Possible UTI       52 year old female patient with a past medical history of uncontrolled hypertension, she has been out of Heidi Woodard medications for several months.  Per epic records she was seen by PCP on 02/11/2023 and started on lisinopril 5 mg daily.  She reports a follow-up visit today, he increased Heidi Woodard lisinopril to 10 mg daily.  BP elevated during visit today.  She is asymptomatic.      Dysuria   This is a new problem. The current episode started in the past 7 days. The problem has been gradually worsening. The quality of the pain is described as burning. The pain is at a severity of 4/10. The pain is mild. There has been no fever. There is No history of pyelonephritis. Associated symptoms include frequency and urgency. Pertinent negatives include no discharge, flank pain or hematuria.       ROS: Negative unless otherwise noted in HPI.    Objective:     Physical Exam  Vitals and nursing note reviewed.   Constitutional:       General: She is not in acute distress. Appearance: Normal appearance.   HENT:      Head: Normocephalic.   Eyes:      Pupils: Pupils are equal, round, and reactive to light.   Cardiovascular:      Rate and Rhythm: Normal rate.   Pulmonary:      Effort: Pulmonary effort is normal.      Breath sounds: Normal breath sounds.   Abdominal:      General: Abdomen is flat. Bowel sounds are normal.      Palpations: Abdomen is soft.      Tenderness: There is no right CVA tenderness or left CVA tenderness.   Skin:     General: Skin is warm and dry.   Neurological:      Mental Status: She is alert and oriented to person, place, and time.         PHQ-2 Score:      PHQ-9 Score:      Screening complete, no depression identified / no further action needed today

## 2023-02-18 NOTE — Unmapped (Signed)
Patient in office for a BP nurse visit.  Patient does have BP log, BP taken today and reported to Dr. Allena Katz.  Per Dr. Allena Katz, continue monitoring BP with logs at home, in crease lisinopril to .  Patient back in  mg and start taking atorvastatin. Continue increasing exercise, limit salt intake and return in 2 weeks for nurse visit blood pressure

## 2023-02-18 NOTE — Unmapped (Signed)
In office patients bp is 180/112. States she was seen at her pcp office this morning and her bp there was 17?/100 and was put on a second blood pressure medication that she hasn't picked up from the pharmacy yet. Patient is asymptomatic. Provider made aware.

## 2023-02-24 NOTE — Unmapped (Signed)
UA cx is positive for uti and pathogen is sensitive to macrobid

## 2023-02-25 NOTE — Unmapped (Signed)
UA cx is positive for uti and pathogen is sensitive to macrobid. Patient is feeling better and expressed understanding

## 2023-02-25 NOTE — Unmapped (Signed)
-----   Message from Dorthea Cove, FNP sent at 02/24/2023  9:30 AM EDT -----  UA cx is positive for uti and pathogen is sensitive to macrobid

## 2023-02-28 MED ORDER — UREA 10 % TOPICAL CREAM
5 refills | 0 days | Status: CP
Start: 2023-02-28 — End: ?

## 2023-02-28 NOTE — Unmapped (Signed)
Hi,    Patient Heidi Woodard called requesting a medication refill for the following:    Medication: Urea  Dosage: 10% Cream  Days left of medication: 0  Pharmacy:   [facility] [address] [place] - [address]  [address], [name] [address]  Phone: [phone]  Fax: [phone]       The expected turnaround time is 3-4 business days       Check Indicates criteria has been reviewed and confirmed with the patient:    [x]  Preferred Name   [x]  DOB and/or MR#  [x]  Preferred Contact Method  [x]  Phone Number(s)   [x]  Preferred Pharmacy   [x]  MyChart     Thank you,  Iona Hansen  [facility] Communication [facility]  [phone]

## 2023-03-01 NOTE — Unmapped (Signed)
Patient dropped off blood pressure readings to be reviewed by Dr. Allena Katz. Readings reviewed by Dr. Allena Katz, verbal order given by Dr. Allena Katz to call the patient and informed to increase lisinopril 5 mg 2 tabs daily, 10 mg total, if she is taking 5 mg as prescribed at her last visit.Keep nurse visit appointment 03/04/23. Telephone call to the patient and asked if she's taking 5 mg daily, per patient yes. Informed as per Dr. Allena Katz to increase to 10 mg,patient verbalized understanding.Scanned readings to chart.C Quint Chestnut-RMA

## 2023-03-04 ENCOUNTER — Institutional Professional Consult (permissible substitution): Admit: 2023-03-04 | Discharge: 2023-03-05 | Payer: MEDICARE

## 2023-03-04 DIAGNOSIS — I10 Essential (primary) hypertension: Principal | ICD-10-CM

## 2023-03-04 MED ORDER — CHLORTHALIDONE 25 MG TABLET
ORAL_TABLET | Freq: Every morning | ORAL | 0 refills | 30 days | Status: CP
Start: 2023-03-04 — End: 2024-03-03

## 2023-03-04 NOTE — Unmapped (Signed)
Per patient took Lisinopril 5 mg at 5:30 am this morning. Did not bring blood pressure cuff from home because of not having one and no blood pressure readings. Per patient does not smoke or drink alcohol. Per patient no dizziness or muscle spasm;trying to cut back on salt. Drank a sip of water to take medications.Ankles are swollen;notified provider of all.Patient sitting 15 minuets to recheck blood pressure as per protocol.Verbally notified provider of elevated blood pressure at 10:08 am.C Louanne Calvillo-RMA

## 2023-03-04 NOTE — Unmapped (Signed)
Rechecked blood pressure left arm 150/112. Verbal order given by Dr. Allena Katz @ 10:10 am to inform patient to start a diuretic chlorthalidone 12.5 mg to take every morning and a nurse visit in 10 days for blood pressure check and BMP;new medication sent to pharmacy by Dr. Allena Katz. Check blood pressures at home and bring readings/monitor at nurse visit. Patient made aware and verbalized understanding. Logs given to patient.C Payden Bonus-RMA

## 2023-03-07 NOTE — Unmapped (Unsigned)
[facility] BONE AND SOFT TISSUE ONCOLOGY RETURN VISIT    Encounter Date: 03/08/2023  Patient Name: Heidi Woodard  Medical Record Number: [id number]    Referring Physician: Sheral Apley, MD  [address]  [facility] [address] [place],  [place] [address]    Primary Care Provider: Catalina Lunger, DO    DIAGNOSIS:  No diagnosis found.        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 Continue prn topical steroids  - Continue urea cream on callouses on feet and also along neck.   - MRI Left shoulder, 02/08/20, similar to decreased bulk of shoulder mass  - 05/20/20: held sorafenib in anticipation of TKR on 05/26/20  - 08/04/20: MRI Left shoulder, similar size but increase in pain/swelling. Restarted sorafenib 200 mg daily.    - 12/12/20: Continues on sorafenib 200 mg daily, doing well. Improvement in desmoid associated pain, manageable with low dose opioids. HFS, HTN, nausea under control.  -02/13/21: Pain progressively worse over past few months, requiring increasing doses of opioids. MRI is stable/slightly better. However, given her pain, referred for cryoablation, Dr. Cathie Hoops feels it is amenable.  -05/29/21: Seen in follow up. Tolerating sorafenib well. Pain is stable on opioid regimen.  - 06/2021: cryoablation to the L shoulder.  -08/28/21: MRI shows essentially stable disease although minimal growth. Pain and range of motion are stable today. Continue stable dose of sorafenib. Will ask Dr. Cathie Hoops to review MRI if pain worsens.    -11/27/21: Pain stable. Labs good, continue sorafenib 200mg . Reviewed history, 400mg  dose not tolerated well.    03/23/22: Scans show decrease in size and enhancement of desmoid tumor. Continue sorafenib 200mg  daily. Consider treatment holiday at 2 years after cryoablation (06/2023)    07/13/22: Doing well. No concern for progression at present. Continue sorafenib 200mg  daily, scans in 3 mos.    10/19/22: Scans show stable disease. Continue sorafenib 200mg  daily. Referred to PM&R as the pain is also in the contralateral shoulder, which may be a function/rehab issue. Consider treatment holiday in another 6-8 mos (2 years since last progression).    03/08/23: ***  Assessment & Plan        -Possible future options if progression: doxil, sorafenib dose escalation (likely will not be well tolerated), pazopanib, nirogestat.     Essential HTN - likely exacerbated by sorafenib  Generally ~120/80.  - continue hydrochlorothiazide/lisinopril     Desmoid tumor associated pain - improved with sorafenib, then a bit worse. Had follow up visit in May for discussion about safe prescribing, adherence to dose as prescribed, inability to do any more early refills, as she had been requesting several early refills due to escalating pain. Now stable for several months, adherent to pain management agreement.     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 Advised to escalate use of emollients.  - continue urea 10-20% cream - escalate to TID given calluses on hands  - continue aggressive emollient use to prevent dry skin     Birth control: Says she is not sexually active. Aware of teratogenic risk of pregnancy on sorafenib.     Dispo:  RTC 3 mos without imaging  I personally reviewed the medical records, pathology and laboratory results and viewed the imaging. All questions were answered to the patient's apparent satisfaction and they voiced understanding and agreement with the plan. Pt has my card and contact information and is encouraged to reach out with any further questions.    Donzetta Sprung, MD/MPH  Assistant Professor  Bone and Soft Tissue Oncology Program  [facility]  Pager: [phone]  Nurse Navigator: [name] RN, BSN, OCN      REASON FOR CONSULTATION:   DOE CRUSER is a 52 y.o. female who is seen in consultation at the request of Sheral Apley, MD for evaluation of her desmoid fibromatosis.      HISTORY OF PRESENT ILLNESS:      In 2016, pt developed shoulder pain associated with a fall. There was bruising and swelling but she attributed it to the injury. It never improved, and by summer she noted increased swelling and decreased ROM (which is limited by pain, rather than painless weakness). 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 She underwent CT of the left shoudler on 06/25/16 which showed a 16 cm mass involing the left deltoid with likely invasion of the left trapezius, triceps, and teres minor. Possible LN involvement of axillary and supraclav LN on left were present. On 07/07/2016 she saw Dr. Darral Dash and ultrasound-guided biopsy was performed with pathology revealing low-grade spindle cell lesion favoring fibromatosis. He felt that surgical resection would be highly morbid. On 9/20/017, she saw Dr Beckey Rutter, who did not favor RT due to inherent risks associated with RT.      She has ongoing pain, some tingling along the backs of her hands. Pain is intermittent if her arm is at rest, happening about every other day but when it happens it is very intense. Per above it limits her ADLs although she says she is managing.     Interval history:   History of Present Illness        REVIEW OF SYSTEMS:  A comprehensive review of 12 systems was negative except for pertinent positives noted in HPI.    Past Medical History, Surgical History, Family History were reviewed personally. Any changes were updated as above.    Social History:  Lives in [place], [place]  Has a supportive boyfriend  No t/e/d  30 year old daughter    Pregnancy status: perimenopausal, not sexually active    ALLERGIES/MEDICATIONS:  Reviewed in Epic    PHYSICAL EXAM:   There were no vitals taken for this visit.  ECOG 1  Gen - well appearing, in no acute distress  Eyes - conjunctivae clear, PERRL  ENT - mask in place  Lymph - no cervical or supraclavicular lymphadenopathy appreciated  Resp - clear to auscultation bilaterally, no wheezes, crackles or rales  CV - normal rate, regular rhythm, no murmur appreciated, no lower extremity edema noted  GI - abdomen soft, nontender, nondistended, no masses, bowel sounds normal  Skin - no rashes, no lesions noted  Neuro - AOx3, EOM intact, no facial droop, speech fluent and coherent, moves all extremities without asymmetry  Psych - affect normal, mood okay  MSK - left shoulder mass, ill defined but palpable, minimal tenderness.     PATHOLOGY:   Pathology was personally reviewed as described in the HPI, detailed in Epic.     LABS:  Reviewed in Epic    RADIOLOGY:  Imaging was personally viewed and interpreted as summarized in the history (see above).

## 2023-03-08 ENCOUNTER — Ambulatory Visit: Admit: 2023-03-08 | Discharge: 2023-03-09 | Payer: MEDICARE

## 2023-03-08 ENCOUNTER — Other Ambulatory Visit: Admit: 2023-03-08 | Discharge: 2023-03-09 | Payer: MEDICARE

## 2023-03-08 DIAGNOSIS — Z79899 Other long term (current) drug therapy: Principal | ICD-10-CM

## 2023-03-08 DIAGNOSIS — D48119 Desmoid fibromatosis: Principal | ICD-10-CM

## 2023-03-08 DIAGNOSIS — G893 Neoplasm related pain (acute) (chronic): Principal | ICD-10-CM

## 2023-03-08 LAB — CBC W/ AUTO DIFF
BASOPHILS ABSOLUTE COUNT: 0 10*9/L (ref 0.0–0.1)
BASOPHILS RELATIVE PERCENT: 0.4 %
EOSINOPHILS ABSOLUTE COUNT: 0.3 10*9/L (ref 0.0–0.5)
EOSINOPHILS RELATIVE PERCENT: 3.5 %
HEMATOCRIT: 38.4 % (ref 34.0–44.0)
HEMOGLOBIN: 13 g/dL (ref 11.3–14.9)
LYMPHOCYTES ABSOLUTE COUNT: 3.4 10*9/L (ref 1.1–3.6)
LYMPHOCYTES RELATIVE PERCENT: 35.4 %
MEAN CORPUSCULAR HEMOGLOBIN CONC: 33.7 g/dL (ref 32.0–36.0)
MEAN CORPUSCULAR HEMOGLOBIN: 27.9 pg (ref 25.9–32.4)
MEAN CORPUSCULAR VOLUME: 82.8 fL (ref 77.6–95.7)
MEAN PLATELET VOLUME: 7.9 fL (ref 6.8–10.7)
MONOCYTES ABSOLUTE COUNT: 0.5 10*9/L (ref 0.3–0.8)
MONOCYTES RELATIVE PERCENT: 4.7 %
NEUTROPHILS ABSOLUTE COUNT: 5.4 10*9/L (ref 1.8–7.8)
NEUTROPHILS RELATIVE PERCENT: 56 %
PLATELET COUNT: 279 10*9/L (ref 150–450)
RED BLOOD CELL COUNT: 4.64 10*12/L (ref 3.95–5.13)
RED CELL DISTRIBUTION WIDTH: 15.7 % — ABNORMAL HIGH (ref 12.2–15.2)
WBC ADJUSTED: 9.6 10*9/L (ref 3.6–11.2)

## 2023-03-08 LAB — COMPREHENSIVE METABOLIC PANEL
ALBUMIN: 3.8 g/dL (ref 3.4–5.0)
ALKALINE PHOSPHATASE: 89 U/L (ref 46–116)
ALT (SGPT): 11 U/L (ref 10–49)
ANION GAP: 6 mmol/L (ref 5–14)
AST (SGOT): 21 U/L (ref <=34)
BILIRUBIN TOTAL: 0.5 mg/dL (ref 0.3–1.2)
BLOOD UREA NITROGEN: 14 mg/dL (ref 9–23)
BUN / CREAT RATIO: 18
CALCIUM: 9.2 mg/dL (ref 8.7–10.4)
CHLORIDE: 107 mmol/L (ref 98–107)
CO2: 30 mmol/L (ref 20.0–31.0)
CREATININE: 0.79 mg/dL
EGFR CKD-EPI (2021) FEMALE: 90 mL/min/{1.73_m2} (ref >=60)
GLUCOSE RANDOM: 102 mg/dL (ref 70–179)
POTASSIUM: 3.7 mmol/L (ref 3.4–4.8)
PROTEIN TOTAL: 7.9 g/dL (ref 5.7–8.2)
SODIUM: 143 mmol/L (ref 135–145)

## 2023-03-08 MED ORDER — MORPHINE ER 15 MG TABLET,EXTENDED RELEASE
ORAL_TABLET | Freq: Every evening | ORAL | 0 refills | 30 days | Status: CP
Start: 2023-03-08 — End: ?

## 2023-03-08 MED ORDER — OXYCODONE 5 MG TABLET
ORAL_TABLET | Freq: Four times a day (QID) | ORAL | 0 refills | 25 days | Status: CP | PRN
Start: 2023-03-08 — End: 2023-04-07

## 2023-03-09 NOTE — Unmapped (Signed)
[facility] Specialty Pharmacy Refill Coordination Note    Specialty Medication(s) to be Shipped:   Hematology/Oncology: Nexavar    Other medication(s) to be shipped: No additional medications requested for fill at this time     Heidi Woodard, DOB: [birth year]  Phone: [phone] (home) [phone] (work)      All above HIPAA information was verified with patient.     Was a Nurse, learning disability used for this call? No    Completed refill call assessment today to schedule patient's medication shipment from the [facility] [phone]).  All relevant notes have been reviewed.     Specialty medication(s) and dose(s) confirmed: Regimen is correct and unchanged.   Changes to medications: Ghazal reports no changes at this time.  Changes to insurance: No  New side effects reported not previously addressed with a pharmacist or physician: None reported  Questions for the pharmacist: No    Confirmed patient received a Conservation officer, historic buildings and a Surveyor, mining with first shipment. The patient will receive a drug information handout for each medication shipped and additional FDA Medication Guides as required.       DISEASE/MEDICATION-SPECIFIC INFORMATION        N/A    SPECIALTY MEDICATION ADHERENCE     Medication Adherence    Patient reported X missed doses in the last month: 0  Specialty Medication: sorafenib 200 mg once daily  Patient is on additional specialty medications: No  Informant: patient  Confirmed plan for next specialty medication refill: delivery by pharmacy  Refills needed for supportive medications: not needed          Refill Coordination    Has the Patients' Contact Information Changed: No  Is the Shipping Address Different: No       Were doses missed due to medication being on hold? No    Nexavar 200 mg: 3 days of medicine on hand     REFERRAL TO PHARMACIST     Referral to the pharmacist: Not needed      [facility]     Shipping address confirmed in Epic.       Delivery Scheduled: Yes, Expected medication delivery date: 03/11/23.     Medication will be delivered via UPS to the prescription address in Epic [place].    Kermit Balo, [facility]   [facility] Specialty Pharmacist

## 2023-03-10 MED FILL — SORAFENIB 200 MG TABLET: ORAL | 30 days supply | Qty: 30 | Fill #1

## 2023-03-29 ENCOUNTER — Institutional Professional Consult (permissible substitution): Admit: 2023-03-29 | Discharge: 2023-03-30 | Payer: MEDICARE

## 2023-03-29 DIAGNOSIS — I10 Essential (primary) hypertension: Principal | ICD-10-CM

## 2023-03-29 LAB — BASIC METABOLIC PANEL
ANION GAP: 10 mmol/L (ref 3–11)
BLOOD UREA NITROGEN: 17 mg/dL (ref 8–20)
BUN / CREAT RATIO: 18
CALCIUM: 9.3 mg/dL (ref 8.5–10.1)
CHLORIDE: 103 mmol/L (ref 98–107)
CO2: 29.6 mmol/L (ref 21.0–32.0)
CREATININE: 0.92 mg/dL (ref 0.60–1.10)
EGFR CKD-EPI (2021) FEMALE: 75 mL/min/{1.73_m2} (ref >=60)
GLUCOSE RANDOM: 99 mg/dL (ref 70–99)
POTASSIUM: 3.4 mmol/L — ABNORMAL LOW (ref 3.5–5.0)
SODIUM: 143 mmol/L (ref 135–145)

## 2023-03-29 MED ORDER — CHLORTHALIDONE 25 MG TABLET
ORAL_TABLET | Freq: Every morning | ORAL | 0 refills | 90 days | Status: CP
Start: 2023-03-29 — End: 2024-03-28

## 2023-03-29 MED ORDER — LISINOPRIL 5 MG TABLET
ORAL_TABLET | Freq: Every day | ORAL | 0 refills | 90 days | Status: CP
Start: 2023-03-29 — End: 2024-03-28

## 2023-03-29 MED ORDER — MISCELLANEOUS MEDICAL SUPPLY MISC
0 refills | 0 days | Status: CP
Start: 2023-03-29 — End: ?

## 2023-03-29 NOTE — Unmapped (Signed)
Patient in office for 2 week bp check, patient was unable to monitor bp at home, she will need to purchase one. Patient stated she took her lisinopril 5 mg tab and chlorthalidone 25 mg tab this morning.     Per Dr. Allena Katz patient is to have a lab for BMP drawn, medication refill sent to pharmacy for lisinopril 5 mg tab and chlorthalidone 25 mg tab, take 1 tab every morning. A Rx has also been sent for a bp machine to [facility]. Patient is to continue monitoring her bp and follow up in 3 months.

## 2023-03-30 NOTE — Unmapped (Signed)
[facility] Specialty Pharmacy Refill Coordination Note    Specialty Medication(s) to be Shipped:   Hematology/Oncology: Nexavar    Other medication(s) to be shipped: No additional medications requested for fill at this time     Heidi Woodard, DOB: [birth year]  Phone: [phone] (home) [phone] (work)      All above HIPAA information was verified with patient.     Was a Nurse, learning disability used for this call? No    Completed refill call assessment today to schedule patient's medication shipment from the [facility] [phone]).  All relevant notes have been reviewed.     Specialty medication(s) and dose(s) confirmed: Regimen is correct and unchanged.   Changes to medications: Eloni reports no changes at this time.  Changes to insurance: No  New side effects reported not previously addressed with a pharmacist or physician: None reported  Questions for the pharmacist: No    Confirmed patient received a Conservation officer, historic buildings and a Surveyor, mining with first shipment. The patient will receive a drug information handout for each medication shipped and additional FDA Medication Guides as required.       DISEASE/MEDICATION-SPECIFIC INFORMATION        N/A    SPECIALTY MEDICATION ADHERENCE     Medication Adherence    Patient reported X missed doses in the last month: 0  Specialty Medication: Sorafenib 200 mg  Patient is on additional specialty medications: No  Informant: patient     Were doses missed due to medication being on hold? No    Nexavar 200 mg: 14 days of medicine on hand       REFERRAL TO PHARMACIST     Referral to the pharmacist: Not needed      [facility]     Shipping address confirmed in Epic.     Delivery Scheduled: Yes, Expected medication delivery date: 04/08/23.     Medication will be delivered via UPS to the prescription address in Epic [place].    [name]   [facility] Specialty Technician

## 2023-03-31 MED ORDER — MORPHINE ER 15 MG TABLET,EXTENDED RELEASE
ORAL_TABLET | Freq: Every evening | ORAL | 0 refills | 30 days
Start: 2023-03-31 — End: ?

## 2023-03-31 MED ORDER — OXYCODONE 5 MG TABLET
ORAL_TABLET | Freq: Four times a day (QID) | ORAL | 0 refills | 25 days | PRN
Start: 2023-03-31 — End: 2023-04-30

## 2023-03-31 NOTE — Unmapped (Signed)
.  Hi,    Patient Heidi Woodard called requesting a medication refill for the following:    Medication: Oxycodone and Morphine  Dosage:         5mg                    15mg   Days left of medication: Oxycodone 10 left and Morphine 10 left  Pharmacy: [facility] in [place] is calling in reference to her needing to get a medication refill completed.     Please contact Aliyanna back at [phone] when the medication has been called in so she can make preparations to pick it up.     The expected turnaround time is 3-4 business days           Thank you,  Noland Fordyce  [facility]  [phone]

## 2023-04-01 MED ORDER — OXYCODONE 5 MG TABLET
ORAL_TABLET | Freq: Four times a day (QID) | ORAL | 0 refills | 25 days | Status: CP | PRN
Start: 2023-04-01 — End: 2023-05-01

## 2023-04-01 MED ORDER — MORPHINE ER 15 MG TABLET,EXTENDED RELEASE
ORAL_TABLET | Freq: Every evening | ORAL | 0 refills | 30 days | Status: CP
Start: 2023-04-01 — End: ?

## 2023-04-07 MED FILL — SORAFENIB 200 MG TABLET: ORAL | 30 days supply | Qty: 30 | Fill #2

## 2023-04-16 ENCOUNTER — Ambulatory Visit: Admit: 2023-04-16 | Discharge: 2023-04-17 | Payer: MEDICARE

## 2023-04-16 DIAGNOSIS — R310 Gross hematuria: Principal | ICD-10-CM

## 2023-04-16 DIAGNOSIS — R309 Painful micturition, unspecified: Principal | ICD-10-CM

## 2023-04-16 MED ORDER — DOXYCYCLINE HYCLATE 100 MG CAPSULE
ORAL_CAPSULE | Freq: Two times a day (BID) | ORAL | 0 refills | 5 days | Status: CP
Start: 2023-04-16 — End: 2023-04-21

## 2023-04-16 NOTE — Unmapped (Signed)
Subjective:      Heidi Woodard is a 52 y.o. female who complains of dysuria, foul smelling urine, and hematuria for 2 days. Patient denies back pain, fever, stomach ache, and vaginal discharge.  Patient does have a history of recurrent UTI.  Patient does not have a history of pyelonephritis.  The following portions of the patient's history were reviewed and updated as appropriate: allergies, current medications, past family history, past medical history, past social history, past surgical history, and problem list.  Review of Systems  A 12 point review of systems was negative except for pertinent items noted in the HPI.      Objective:      BP 105/70  - Pulse 87  - Temp 36.7 ??C (98 ??F) (Oral)  - Resp 20  - Ht 157.5 cm (5' 2)  - Wt (!) 104.8 kg (231 lb)  - SpO2 95%  - BMI 42.25 kg/m??   General: alert, cooperative, and no distress   Abdomen: soft, non-tender, without masses or organomegaly    Back: back muscles are full ROM   GU: defer exam     Laboratory:   Urine dipstick shows  postive for leukocyte esterase and postive for protein.    Micro exam: not done.      Assessment:      Acute cystitis      Plan:  Plan:      1. Medications: nitrofurantoin; doxycycline sent in error and recalled.  2. Maintain adequate hydration  3. Follow up with PCP for referral to urology due to recurrent symptoms and worsening urine dip.

## 2023-04-17 MED ORDER — NITROFURANTOIN MONOHYDRATE/MACROCRYSTALS 100 MG CAPSULE
ORAL_CAPSULE | Freq: Two times a day (BID) | ORAL | 0 refills | 7 days | Status: CP
Start: 2023-04-17 — End: 2023-04-24

## 2023-04-26 NOTE — Unmapped (Signed)
[facility] Refill Coordination Note    Specialty Medication(s) to be Shipped:   Hematology/Oncology: Nexavar    Other medication(s) to be shipped: No additional medications requested for fill at this time     Heidi Woodard, DOB: 10-03-1971  Phone: [phone] (home) [phone] (work)      All above HIPAA information was verified with patient.     Was a Nurse, learning disability used for this call? No    Completed refill call assessment today to schedule patient's medication shipment from the [facility] [phone]).  All relevant notes have been reviewed.     Specialty medication(s) and dose(s) confirmed: Regimen is correct and unchanged.   Changes to medications: Heidi Woodard reports no changes at this time.  Changes to insurance: No  New side effects reported not previously addressed with a pharmacist or physician: None reported  Questions for the pharmacist: No    Confirmed patient received a Conservation officer, historic buildings and a Surveyor, mining with first shipment. The patient will receive a drug information handout for each medication shipped and additional FDA Medication Guides as required.       DISEASE/MEDICATION-SPECIFIC INFORMATION        N/A    SPECIALTY MEDICATION ADHERENCE     Medication Adherence    Patient reported X missed doses in the last month: 0  Specialty Medication: Sorafenib 200 mg  Patient is on additional specialty medications: No  Informant: patient     Were doses missed due to medication being on hold? No    Nexavar 200 mg: 14 days of medicine on hand       REFERRAL TO PHARMACIST     Referral to the pharmacist: Not needed      [facility]     Shipping address confirmed in Epic.     Delivery Scheduled: Yes, Expected medication delivery date: 05/04/23.     Medication will be delivered via UPS to the prescription address in Epic [place].    Heidi Woodard   [facility] Specialty Technician

## 2023-04-26 NOTE — Unmapped (Signed)
Hi,    Patient Heidi Woodard called requesting a medication refill for the following:    Medication: Oxycodone  Dosage:5mg   Days left of medication: 0  Pharmacy:   [facility] [address] [place] - [address]  [address], [place] [address]  Phone: [phone]  Fax: [phone]           The expected turnaround time is 3-4 business days       Check Indicates criteria has been reviewed and confirmed with the patient:    [x]  Preferred Name   [x]  DOB and/or MR#  [x]  Preferred Contact Method  [x]  Phone Number(s)   [x]  Preferred Pharmacy   [x]  MyChart     Thank you,  Iona Hansen  [place] [facility]  [phone]

## 2023-04-29 MED ORDER — OXYCODONE 5 MG TABLET
ORAL_TABLET | Freq: Four times a day (QID) | ORAL | 0 refills | 25 days | Status: CP | PRN
Start: 2023-04-29 — End: 2023-05-29

## 2023-04-29 NOTE — Unmapped (Signed)
.  Hi,    Patient BRITNAY FITTIPALDI called requesting a medication refill for the following:    Medication: Oxycodone  Dosage: 5mg   Days left of medication: 10   Ph[facility]acy: [facility] in [place]    Aretta is calling requesting her medication refill.   Please contact Margrette back once the medication has been sent in so she can make preparations to pick it up at [phone] asap.    The expected turnaround time is 3-4 business days       Check Indicates criteria has been reviewed and confirmed with the patient:    [x]  Preferred Name   [x]  DOB and/or MR#  [x]  Preferred Contact Method  [x]  Phone Number(s)   [x]  Preferred Pharmacy   [x]  MyChart     Thank you,  Noland Fordyce  [facility]  [phone]

## 2023-05-03 MED FILL — SORAFENIB 200 MG TABLET: ORAL | 30 days supply | Qty: 30 | Fill #3

## 2023-05-10 MED ORDER — MORPHINE ER 15 MG TABLET,EXTENDED RELEASE
ORAL_TABLET | Freq: Every evening | ORAL | 0 refills | 30 days | Status: CP
Start: 2023-05-10 — End: ?

## 2023-05-10 NOTE — Unmapped (Signed)
Hi,    Patient Heidi Woodard called requesting a medication refill for the following:    Medication: Morphine  Dosage: 15 mg  Days left of medication: 0  Pharmacy: [facility]    The expected turnaround time is 3-4 business days       Check Indicates criteria has been reviewed and confirmed with the patient:    [x]  Preferred Name   [x]  DOB and/or MR#  [x]  Preferred Contact Method  [x]  Phone Number(s)   [x]  Preferred Pharmacy   []  MyChart     Thank you,  [name]  [facility]  [phone]

## 2023-05-23 MED ORDER — OXYCODONE 5 MG TABLET
ORAL_TABLET | Freq: Four times a day (QID) | ORAL | 0 refills | 25 days | PRN
Start: 2023-05-23 — End: 2023-06-22

## 2023-05-23 NOTE — Unmapped (Signed)
Hi,    Patient Heidi Woodard called requesting a medication refill for the following:    Medication: Oxycodone  Dosage: 5 MG  Days left of medication: 20  Pharmacy: Pharmacy    [facility] [address], [place] - [address]  [address], [place] [address]  Phone: [phone]  Fax: [phone]       Thank you,  Roseanne Kaufman  [facility]  [phone]

## 2023-05-24 MED ORDER — OXYCODONE 5 MG TABLET
ORAL_TABLET | Freq: Four times a day (QID) | ORAL | 0 refills | 25 days | Status: CP | PRN
Start: 2023-05-24 — End: 2023-06-23

## 2023-05-24 NOTE — Unmapped (Signed)
Called pt to let her know her refill was sent in as requested.She is agreeable to this plan and will call back with any further needs.

## 2023-05-24 NOTE — Unmapped (Signed)
[facility] Pharm[facility] Refill Coordination Note    Specialty Medication(s) to be Shipped:   Hematology/Oncology: Nexavar    Other medication(s) to be shipped: No additional medications requested for fill at this time     Heidi Woodard, DOB: 03/01/1971  Phone: [phone] (home) [phone] (work)      All above HIPAA information was verified with patient.     Was a Nurse, learning disability used for this call? No    Completed refill call assessment today to schedule patient's medication shipment from the [facility] [phone]).  All relevant notes have been reviewed.     Specialty medication(s) and dose(s) confirmed: Regimen is correct and unchanged.   Changes to medications: Heidi Woodard reports no changes at this time.  Changes to insurance: No  New side effects reported not previously addressed with a pharmacist or physician: None reported  Questions for the pharmacist: No    Confirmed patient received a Conservation officer, historic buildings and a Surveyor, mining with first shipment. The patient will receive a drug information handout for each medication shipped and additional FDA Medication Guides as required.       DISEASE/MEDICATION-SPECIFIC INFORMATION        N/A    SPECIALTY MEDICATION ADHERENCE     Medication Adherence    Patient reported X missed doses in the last month: 0  Specialty Medication: Sorafenib 200 mg  Patient is on additional specialty medications: No  Informant: patient     Were doses missed due to medication being on hold? No    Nexavar 200 mg: 16 days of medicine on hand       REFERRAL TO PHARMACIST     Referral to the pharmacist: Not needed      [facility]     Shipping address confirmed in Epic.     Delivery Scheduled: Yes, Expected medication delivery date: 06/08/23.     Medication will be delivered via UPS to the prescription address in Epic [place].    Heidi Woodard Heidi Woodard   [facility] Specialty Technician

## 2023-06-06 NOTE — Unmapped (Signed)
[facility] BONE AND SOFT TISSUE ONCOLOGY RETURN VISIT    Encounter Date: 06/07/2023  Patient Name: Heidi Woodard  Medical Record Number: [id number]    Referring Physician: Sheral Apley, MD  [address]  [facility] [address] [place],  [place] [address]    Primary Care Provider: Catalina Lunger, DO    DIAGNOSIS:  1. Desmoid fibromatosis    2. High risk medication use    3. Cancer related pain    4. 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 Continue prn topical steroids  - Continue urea cream on callouses on feet and also along neck.   - MRI Left shoulder, 02/08/20, similar to decreased bulk of shoulder mass  - 05/20/20: held sorafenib in anticipation of TKR on 05/26/20  - 08/04/20: MRI Left shoulder, similar size but increase in pain/swelling. Restarted sorafenib 200 mg daily.    - 12/12/20: Continues on sorafenib 200 mg daily, doing well. Improvement in desmoid associated pain, manageable with low dose opioids. HFS, HTN, nausea under control.  -02/13/21: Pain progressively worse over past few months, requiring increasing doses of opioids. MRI is stable/slightly better. However, given her pain, referred for cryoablation, Dr. Cathie Hoops feels it is amenable.  - 06/2021: cryoablation to the L shoulder.  - 03/23/22: Scans show decrease in size and enhancement of desmoid tumor. Continue sorafenib 200mg  daily. Consider treatment holiday at 2 years after cryoablation (06/2023)  - 10/19/22: Scans show stable disease. Continue sorafenib 200mg  daily. Referred to PM&R due to pain in contralateral shoulder, may be a function/rehab issue.     03/08/23: Doing well. Reasonable tolerance aside from fatigue, HTN. MRI in 3 mos, consider treatment holiday if stable disease.    06/07/23: Scans personally reviewed, interpreted and discussed with the patient, which show stable disease. Treatment holiday from sorafenib.   Assessment & Plan  1. Shoulder pain.  The patient's discomfort during the MRI scan was addressed, and reassurance was provided regarding her kidney health. The scan results indicate stability in her condition, with no significant changes observed. Her lab results are satisfactory, with normal blood counts and creatinine levels. A report will be filed to investigate the cause of her discomfort during the MRI scan. Given the stability of her condition, it is deemed safe to discontinue her current medication. She has been advised to cease taking sorafenib and to bring any remaining pills to her next appointment for proper disposal.    2. Desmoid tumor.  The tumor remains stable with minimal enhancement on the scans. Given the stability for 2 years, it is considered safe to stop the sorafenib treatment. The potential for future growth was discussed, but the likelihood of immediate growth is low. She will be monitored closely with virtual check-ins every 3 months to assess pain and any changes in condition. If there are concerns, a scan can be scheduled in 3 months, otherwise, a scan will be done in 6 months.    3. Pain management.  She reports her pain level at a five today, which is mostly controlled with her current pain medications. She experiences fluctuations in pain, particularly when lying down. She is advised to continue her current pain management regimen and report any significant changes in pain  levels.    Follow-up  The patient will follow up in 3 months.    -Possible future options if progression: resume sorafenib 200mg , Doxil, pazopanib, nirogestat.     Essential HTN - likely exacerbated by sorafenib  Generally ~120/80.  -worsened lately, working with PCP to address this     Desmoid tumor associated pain - improved with sorafenib, then a bit worse. Had follow up visit in May for discussion about safe prescribing, adherence to dose as prescribed, inability to do any more early refills, as she had been requesting several early refills due to escalating pain. Now stable for several months, adherent to pain management agreement.     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 Virtual okay.    I personally reviewed the medical records, pathology and laboratory results and viewed the imaging. All questions were answered to the patient's apparent satisfaction and they voiced understanding and agreement with the plan. Pt has my card and contact information and is encouraged to reach out with any further questions.    Donzetta Sprung, MD/MPH  Assistant Professor  Bone and Soft Tissue Oncology Program  [facility]  Pager: [phone]  Nurse Navigator: [name] RN, BSN, OCN      REASON FOR CONSULTATION:   Heidi Woodard is a 52 y.o. female who is seen in consultation at the request of Sheral Apley, MD for evaluation of her desmoid fibromatosis.      HISTORY OF PRESENT ILLNESS:      In 2016, pt developed shoulder pain associated with a fall. There was bruising and swelling but she attributed it to the injury. It never improved, and by summer she noted increased swelling and decreased ROM (which is limited by pain, rather than painless weakness). 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 She underwent CT of the left shoudler on 06/25/16 which showed a 16 cm mass involing the left deltoid with likely invasion of the left trapezius, triceps, and teres minor. Possible LN involvement of axillary and supraclav LN on left were present. On 07/07/2016 she saw Dr. Darral Dash and ultrasound-guided biopsy was performed with pathology revealing low-grade spindle cell lesion favoring fibromatosis. He felt that surgical resection would be highly morbid. On 9/20/017, she saw Dr Beckey Rutter, who did not favor RT due to inherent risks associated with RT.      She has ongoing pain, some tingling along the backs of her hands. Pain is intermittent if her arm is at rest, happening about every other day but when it happens it is very intense. Per above it limits her ADLs although she says she is managing.     Interval history:   History of Present Illness  The patient presents for evaluation of desmoid tumor.    She reports that her pain level today is at 5, but it is managed well with her current medication. The intensity of the pain fluctuates, sometimes worsening when she lies down or applies pressure to certain areas. Despite the pain, she maintains an active lifestyle, although her energy levels vary due to her chemotherapy medication.    She has been on sorafenib since 2018, with some dose reductions over time. In 2021,  she underwent knee surgery, during which her medication was temporarily discontinued. However, in 2022, she experienced an increase in pain, leading to cryoablation treatment. She still has a supply of sorafenib at home.    She also reports an unusual experience during her recent MRI scan, where she was asked to remove all clothing except for socks and shoes. This was a deviation from her usual practice of keeping her sports bra on during the scan. Additionally, the technician mentioned something about her kidneys, which caused her some concern. She is curious if there are any issues with her kidneys.      REVIEW OF SYSTEMS:  A comprehensive review of 12 systems was negative except for pertinent positives noted in HPI.    Past Medical History, Surgical History, Family History were reviewed personally. Any changes were updated as above.    Social History:  Lives in [place], [place]  Has a supportive boyfriend  No t/e/d  77 year old daughter    Pregnancy status: perimenopausal, not sexually active    ALLERGIES/MEDICATIONS:  Reviewed in Epic    PHYSICAL EXAM:   BP 119/82  - Pulse 92  - Temp 36.7 ??C (98.1 ??F) (Temporal)  - Ht 157.5 cm (5' 2)  - Wt (!) 105.7 kg (233 lb 1.6 oz)  - SpO2 98%  - BMI 42.63 kg/m??   ECOG 0  Gen - well appearing, in no acute distress  Eyes - conjunctivae clear, PERRL  ENT - mask in place  Lymph - no cervical or supraclavicular lymphadenopathy appreciated  Resp - clear to auscultation bilaterally, no wheezes, crackles or rales  CV - normal rate, regular rhythm, no murmur appreciated, no lower extremity edema noted  GI - abdomen soft, nontender, nondistended, no masses, bowel sounds normal  Skin - no rashes, no lesions noted  Neuro - AOx3, EOM intact, no facial droop, speech fluent and coherent, moves all extremities without asymmetry  Psych - affect normal, mood okay  MSK - left shoulder mass, ill defined but palpable, minimal tenderness.     PATHOLOGY:   Pathology was personally reviewed as described in the HPI, detailed in Epic.     LABS:  Reviewed in Epic    RADIOLOGY:  Imaging was personally viewed and interpreted as summarized in the history (see above).

## 2023-06-07 ENCOUNTER — Ambulatory Visit: Admit: 2023-06-07 | Discharge: 2023-06-07 | Payer: MEDICARE

## 2023-06-07 ENCOUNTER — Other Ambulatory Visit: Admit: 2023-06-07 | Discharge: 2023-06-07 | Payer: MEDICARE

## 2023-06-07 DIAGNOSIS — Z79899 Other long term (current) drug therapy: Principal | ICD-10-CM

## 2023-06-07 DIAGNOSIS — D48119 Desmoid fibromatosis: Principal | ICD-10-CM

## 2023-06-07 LAB — COMPREHENSIVE METABOLIC PANEL
ALBUMIN: 3.6 g/dL (ref 3.4–5.0)
ALKALINE PHOSPHATASE: 91 U/L (ref 46–116)
ALT (SGPT): 14 U/L (ref 10–49)
ANION GAP: 8 mmol/L (ref 5–14)
AST (SGOT): 24 U/L (ref <=34)
BILIRUBIN TOTAL: 0.7 mg/dL (ref 0.3–1.2)
BLOOD UREA NITROGEN: 12 mg/dL (ref 9–23)
BUN / CREAT RATIO: 15
CALCIUM: 9.7 mg/dL (ref 8.7–10.4)
CHLORIDE: 101 mmol/L (ref 98–107)
CO2: 30 mmol/L (ref 20.0–31.0)
CREATININE: 0.79 mg/dL
EGFR CKD-EPI (2021) FEMALE: 89 mL/min/{1.73_m2} (ref >=60)
GLUCOSE RANDOM: 99 mg/dL (ref 70–179)
POTASSIUM: 3 mmol/L — ABNORMAL LOW (ref 3.4–4.8)
PROTEIN TOTAL: 8.2 g/dL (ref 5.7–8.2)
SODIUM: 139 mmol/L (ref 135–145)

## 2023-06-07 LAB — CBC W/ AUTO DIFF
BASOPHILS ABSOLUTE COUNT: 0 10*9/L (ref 0.0–0.1)
BASOPHILS RELATIVE PERCENT: 0.3 %
EOSINOPHILS ABSOLUTE COUNT: 0.2 10*9/L (ref 0.0–0.5)
EOSINOPHILS RELATIVE PERCENT: 1.6 %
HEMATOCRIT: 38.3 % (ref 34.0–44.0)
HEMOGLOBIN: 13 g/dL (ref 11.3–14.9)
LYMPHOCYTES ABSOLUTE COUNT: 3.7 10*9/L — ABNORMAL HIGH (ref 1.1–3.6)
LYMPHOCYTES RELATIVE PERCENT: 36.7 %
MEAN CORPUSCULAR HEMOGLOBIN CONC: 33.9 g/dL (ref 32.0–36.0)
MEAN CORPUSCULAR HEMOGLOBIN: 28.2 pg (ref 25.9–32.4)
MEAN CORPUSCULAR VOLUME: 83.2 fL (ref 77.6–95.7)
MEAN PLATELET VOLUME: 7.8 fL (ref 6.8–10.7)
MONOCYTES ABSOLUTE COUNT: 0.5 10*9/L (ref 0.3–0.8)
MONOCYTES RELATIVE PERCENT: 5.2 %
NEUTROPHILS ABSOLUTE COUNT: 5.7 10*9/L (ref 1.8–7.8)
NEUTROPHILS RELATIVE PERCENT: 56.2 %
PLATELET COUNT: 320 10*9/L (ref 150–450)
RED BLOOD CELL COUNT: 4.61 10*12/L (ref 3.95–5.13)
RED CELL DISTRIBUTION WIDTH: 15 % (ref 12.2–15.2)
WBC ADJUSTED: 10.1 10*9/L (ref 3.6–11.2)

## 2023-06-07 MED ADMIN — gadobenate dimeglumine (MULTIHANCE) 529 mg/mL (0.1mmol/0.2mL) solution 10 mL: 10 mL | INTRAVENOUS | @ 17:00:00 | Stop: 2023-06-07

## 2023-06-07 MED FILL — SORAFENIB 200 MG TABLET: ORAL | 30 days supply | Qty: 30 | Fill #4

## 2023-06-13 MED ORDER — MORPHINE ER 15 MG TABLET,EXTENDED RELEASE
ORAL_TABLET | Freq: Every evening | ORAL | 0 refills | 30 days | Status: CP
Start: 2023-06-13 — End: ?

## 2023-06-13 MED ORDER — OXYCODONE 5 MG TABLET
ORAL_TABLET | Freq: Four times a day (QID) | ORAL | 0 refills | 25 days | Status: CP | PRN
Start: 2023-06-13 — End: 2023-07-13

## 2023-06-13 NOTE — Unmapped (Signed)
Hi,    Patient Heidi Woodard called requesting a medication refill for the following:    Medication: Oxycodone 5 MG and Morphine 15 MG  Days left of medication: 0  Pharmacy: Pharmacy    [facility] [address] [place] - [address]  [address], [place] [address]  Phone: [phone]  Fax: [phone]     Thank you,  Roseanne Kaufman  [facility]  [phone]

## 2023-06-29 ENCOUNTER — Ambulatory Visit: Admit: 2023-06-29 | Payer: MEDICARE

## 2023-07-01 NOTE — Unmapped (Signed)
Specialty Medication(s): sorafenib    Ms.Hayley has been dis-enrolled from the [facility] specialty pharmacy services due to  stable disease. Treatment holiday from sorafenib per provider notes 06/08/23 .    Additional information provided to the patient: NA    Kermit Balo, [facility]  [facility] Specialty Pharmacist

## 2023-07-11 MED ORDER — ONDANSETRON HCL 8 MG TABLET
ORAL_TABLET | Freq: Three times a day (TID) | ORAL | 3 refills | 10 days | Status: CP | PRN
Start: 2023-07-11 — End: ?

## 2023-07-11 MED ORDER — MORPHINE ER 15 MG TABLET,EXTENDED RELEASE
ORAL_TABLET | Freq: Every evening | ORAL | 0 refills | 30 days | Status: CP
Start: 2023-07-11 — End: ?

## 2023-07-11 MED ORDER — OXYCODONE 5 MG TABLET
ORAL_TABLET | Freq: Four times a day (QID) | ORAL | 0 refills | 25 days | Status: CP | PRN
Start: 2023-07-11 — End: 2023-08-10

## 2023-07-11 NOTE — Unmapped (Signed)
Called pt to let her know her refills were sent in as requested. Asked her to call back with any further needs.

## 2023-07-11 NOTE — Unmapped (Signed)
Hi,    Patient Heidi Woodard called requesting a medication refill for the following:    Medication: Oxycodone  Dosage: 5 mg  Days left of medication: 0      Medication: Morphine  Dosage: 15 mg  Days left of medication: 0      Medication: Zofran  Dosage: 8 mg  Days left of medication: 0  Pharmacy: [facility], [place]      The expected turnaround time is 3-4 business days     Thank you,  Cipriano Mile  [facility]  [phone]       The expected turnaround time is 3-4 business days     Thank you,  Cipriano Mile  [facility] Communication [facility]  [phone]

## 2023-07-14 NOTE — Unmapped (Signed)
Returned call to pt. Let her know I spoke with the pharmacy. They have received the scripts.She can pick up Morphine now and oxycodone on Sunday.She is agreeable to this plan and will call back with any further needs.

## 2023-07-14 NOTE — Unmapped (Signed)
Hi,    Patient Heidi Woodard called requesting a medication refill for the following:    Medication: Oxycodone 5 MG and Morphone 15 MG  Days left of medication: 0  Pharmacy: Pharmacy    [facility] [address] [place] - [address]  [address], [place] [address]  Phone: [phone]  Fax: [phone]         The expected turnaround time is 3-4 business days     Thank you,  Roseanne Kaufman  [facility] Communication [facility]  [phone]

## 2023-07-22 ENCOUNTER — Other Ambulatory Visit (HOSPITAL_COMMUNITY): Payer: Self-pay | Admitting: *Deleted

## 2023-07-22 DIAGNOSIS — I10 Essential (primary) hypertension: Principal | ICD-10-CM

## 2023-07-22 DIAGNOSIS — Z1231 Encounter for screening mammogram for malignant neoplasm of breast: Secondary | ICD-10-CM

## 2023-07-22 MED ORDER — LISINOPRIL 5 MG TABLET
ORAL_TABLET | Freq: Every day | ORAL | 0 refills | 90 days | Status: CP
Start: 2023-07-22 — End: ?

## 2023-07-22 MED ORDER — CHLORTHALIDONE 25 MG TABLET
ORAL_TABLET | Freq: Every morning | ORAL | 0 refills | 90 days
Start: 2023-07-22 — End: ?

## 2023-07-22 NOTE — Unmapped (Signed)
Medication was stopped by Dr. Allena Katz on 04/16/2023.C Bahja Bence-RMA

## 2023-07-25 MED ORDER — CHLORTHALIDONE 25 MG TABLET
ORAL_TABLET | Freq: Every morning | ORAL | 0 refills | 0 days
Start: 2023-07-25 — End: ?

## 2023-07-26 MED ORDER — CHLORTHALIDONE 25 MG TABLET
ORAL_TABLET | Freq: Every morning | ORAL | 0 refills | 90 days | Status: CP
Start: 2023-07-26 — End: ?

## 2023-07-26 NOTE — Unmapped (Signed)
Spoke verbally with patient by phone, patient has never stopped taking the requested medication. She is needing a refill to be sent to [facility] in [place].

## 2023-07-28 ENCOUNTER — Ambulatory Visit (HOSPITAL_COMMUNITY): Payer: Medicare Other

## 2023-08-03 ENCOUNTER — Inpatient Hospital Stay (HOSPITAL_COMMUNITY): Admission: RE | Admit: 2023-08-03 | Payer: Medicare Other | Source: Ambulatory Visit

## 2023-08-04 ENCOUNTER — Ambulatory Visit: Admit: 2023-08-04 | Payer: MEDICARE

## 2023-08-08 NOTE — Unmapped (Signed)
Hi,    Patient REMAS SOBEL called requesting a medication refill for the following:    Medication: oxycodone 5mg   Pharmacy: [facility]    Medication: MS contin 15mg   Pharmacy: [facility]    The expected turnaround time is 3-4 business days     Thank you,  [name]  [facility] Communication [facility]  [phone]

## 2023-08-09 MED ORDER — OXYCODONE 5 MG TABLET
ORAL_TABLET | Freq: Four times a day (QID) | ORAL | 0 refills | 25 days | Status: CP | PRN
Start: 2023-08-09 — End: 2023-09-08

## 2023-08-09 MED ORDER — MORPHINE ER 15 MG TABLET,EXTENDED RELEASE
ORAL_TABLET | Freq: Every evening | ORAL | 0 refills | 30 days | Status: CP
Start: 2023-08-09 — End: ?

## 2023-08-09 NOTE — Unmapped (Signed)
Returned call to [name] to make aware that the medication that was requested was sent into the pharmacy on file.     No further needs at this time

## 2023-09-02 ENCOUNTER — Ambulatory Visit (HOSPITAL_COMMUNITY)
Admission: RE | Admit: 2023-09-02 | Discharge: 2023-09-02 | Disposition: A | Discharge status: Home / Self Care | Payer: Medicare Other | Source: Ambulatory Visit | Attending: *Deleted | Admitting: *Deleted

## 2023-09-02 DIAGNOSIS — Z1231 Encounter for screening mammogram for malignant neoplasm of breast: Secondary | ICD-10-CM | POA: Diagnosis present

## 2023-09-05 ENCOUNTER — Ambulatory Visit: Admit: 2023-09-05 | Payer: MEDICARE

## 2023-09-05 MED ORDER — MORPHINE ER 15 MG TABLET,EXTENDED RELEASE
ORAL_TABLET | Freq: Every evening | ORAL | 0 refills | 30 days | Status: CP
Start: 2023-09-05 — End: ?

## 2023-09-05 MED ORDER — OXYCODONE 5 MG TABLET
ORAL_TABLET | Freq: Four times a day (QID) | ORAL | 0 refills | 25 days | Status: CP | PRN
Start: 2023-09-05 — End: 2023-10-05

## 2023-09-05 NOTE — Unmapped (Signed)
Hi,    Patient Heidi Woodard called requesting a medication refill for the following:    Medications:   morphine (MS CONTIN) 15 MG 12 hr         oxyCODONE (ROXICODONE) 5 MG   Pharmacy: [facility] [address], [place] - [address]  [address], [place] [address]  Phone: [phone]  Fax: [phone]       The expected turnaround time is 3-4 business days     Thank you,  [name]  [facility] Communication [facility]  [phone]

## 2023-09-05 NOTE — Unmapped (Signed)
Returned call to Ms. Gonser  to make aware that the medications that were requested were sent into the pharmacy on file.     No further needs at this time

## 2023-09-26 MED ORDER — OXYCODONE 5 MG TABLET
ORAL_TABLET | Freq: Four times a day (QID) | ORAL | 0 refills | 25 days | PRN
Start: 2023-09-26 — End: 2023-10-26

## 2023-09-26 MED ORDER — MORPHINE ER 15 MG TABLET,EXTENDED RELEASE
ORAL_TABLET | Freq: Every evening | ORAL | 0 refills | 30 days
Start: 2023-09-26 — End: ?

## 2023-09-26 NOTE — Unmapped (Signed)
[facility] Other Call ONC Phone Room Smart Lists: Medication Refill -     Hi,    Patient Heidi Woodard called requesting a medication refill for the following:    Medication: Morphine 15 MG and Oxycodone 5 MG  Pharmacy: Pharmacy    [facility] [address], [place] - [address]  [address], [place] [address]  Phone: [phone]  Fax: [phone]         The expected turnaround time is 3-4 business days     Thank you,  Roseanne Kaufman  [facility] Communication [facility]  [phone]    [facility]

## 2023-09-26 NOTE — Unmapped (Unsigned)
Chief concern/reason for visit: No chief complaint on file.      Assessment & Plan  Primary hypertension         Hyperlipidemia, unspecified hyperlipidemia type           No follow-ups on file.      Subjective     HPI:  ***       Objective     There were no vitals taken for this visit.  PTHomeBP           ROS:  Review of systems negative unless noted in HPI.     Physical Exam:  Physical Exam     {Labs/Imaging/Pathology (Optional):21094520}    {MDM Documentation Elements (Optional) :16109604}    {Time Attest / VideoTeleDisclaimer / Interpreter (Optional):21094932}

## 2023-09-27 MED ORDER — MORPHINE ER 15 MG TABLET,EXTENDED RELEASE
ORAL_TABLET | Freq: Every evening | ORAL | 0 refills | 30 days | Status: CP
Start: 2023-09-27 — End: ?

## 2023-09-27 MED ORDER — OXYCODONE 5 MG TABLET
ORAL_TABLET | Freq: Four times a day (QID) | ORAL | 0 refills | 25 days | Status: CP | PRN
Start: 2023-09-27 — End: 2023-10-27

## 2023-09-27 NOTE — Unmapped (Signed)
Called pt to let her know her refills have been sent in as requested.Asked her to call back with any further needs.

## 2023-10-04 NOTE — Unmapped (Signed)
Chronic, well controlled  Current regimen: chlorthalidone 25 mg, lisinopril 5 mg

## 2023-10-04 NOTE — Unmapped (Unsigned)
Chief concern/reason for visit: No chief complaint on file.      Assessment & Plan  Primary hypertension  Chronic, well controlled  Current regimen: chlorthalidone 25 mg, lisinopril 5 mg       Hyperlipidemia, unspecified hyperlipidemia type  Chronic, unclear control  On atorvastatin, compliant  Rechecking lipid panel         No follow-ups on file.      Subjective     HPI:  Heidi Woodard is a 52 yo woman w/ PMHx HTN, HLD, desmoid fibromatosis and cancer pain who presents today for follow-up of her BP and her cholesterol. She reports consistently taking her BP meds.       Objective     There were no vitals taken for this visit.  PTHomeBP           ROS:  Review of systems negative unless noted in HPI.     Physical Exam:  Physical Exam     {Labs/Imaging/Pathology (Optional):21094520}    {MDM Documentation Elements (Optional) :32440102}    {Time Attest / VideoTeleDisclaimer / Interpreter (Optional):21094932}

## 2023-10-06 NOTE — Unmapped (Unsigned)
 Chief concern/reason for visit: No chief complaint on file.      Assessment & Plan  Primary hypertension  Chronic, well controlled  Current regimen: chlorthalidone 25 mg, lisinopril 5 mg       Hyperlipidemia, unspecified hyperlipidemia type  Chronic, unclear control  On atorvastatin, compliant  Rechecking lipid panel         No follow-ups on file.      Subjective     HPI:  [name] is a 52 yo woman w/ PMHx HTN, HLD, desmoid fibromatosis and cancer pain who presents today for follow-up of her BP and her cholesterol. She reports consistently taking her BP meds.       Objective     There were no vitals taken for this visit.  PTHomeBP           ROS:  Review of systems negative unless noted in HPI.     Physical Exam:  Physical Exam     {Labs/Imaging/Pathology (Optional):21094520}    {MDM Documentation Elements (Optional) :32440102}    {Time Attest / VideoTeleDisclaimer / Interpreter (Optional):21094932}

## 2023-10-06 NOTE — Unmapped (Signed)
Chronic, unclear control  On atorvastatin, compliant  Rechecking lipid panel

## 2023-10-06 NOTE — Unmapped (Signed)
 Chronic, well controlled  Current regimen: chlorthalidone 25 mg, lisinopril 5 mg

## 2023-10-17 NOTE — Unmapped (Signed)
 Chronic, unclear control  On atorvastatin, compliant  Rechecking lipid panel

## 2023-10-17 NOTE — Unmapped (Unsigned)
 Chief concern/reason for visit: No chief complaint on file.      Assessment & Plan  Primary hypertension  Chronic, well controlled  Current regimen: chlorthalidone 25 mg, lisinopril 5 mg       Hyperlipidemia, unspecified hyperlipidemia type  Chronic, unclear control  On atorvastatin, compliant  Rechecking lipid panel         No follow-ups on file.      Subjective     HPI:  Heidi Woodard is a 52 yo woman w/ PMHx HTN, HLD, desmoid fibromatosis and cancer pain who presents today for follow-up of her BP and her cholesterol. She reports consistently taking her BP meds.       Objective     There were no vitals taken for this visit.  PTHomeBP           ROS:  Review of systems negative unless noted in HPI.     Physical Exam:  Physical Exam     {Labs/Imaging/Pathology (Optional):21094520}    {MDM Documentation Elements (Optional) :32440102}    {Time Attest / VideoTeleDisclaimer / Interpreter (Optional):21094932}

## 2023-10-17 NOTE — Unmapped (Signed)
 Chronic, well controlled  Current regimen: chlorthalidone 25 mg, lisinopril 5 mg

## 2023-10-21 NOTE — Unmapped (Addendum)
Chronic, well controlled in office today.  Pt reports higher readings at home in the 140s/90s, bringing her back in two weeks with home BP machine to ensure accuracy.   Current regimen: chlorthalidone 25 mg, lisinopril 5 mg      Orders:    lisinopril (PRINIVIL,ZESTRIL) 5 MG tablet; Take 1 tablet (5 mg total) by mouth daily.    chlorthalidone (HYGROTON) 25 MG tablet; Take 1 tablet (25 mg total) by mouth every morning.

## 2023-10-21 NOTE — Unmapped (Addendum)
Chronic, unclear control  On atorvastatin, compliant  Rechecking lipid panel  Orders:    Lipid Panel

## 2023-10-21 NOTE — Unmapped (Signed)
Chief concern/reason for visit: Follow-up (Follow up blood pressure check -did not bring in BP log , patient reports she has been taking blood pressure medications as prescribed )      Assessment & Plan  Primary hypertension  Chronic, well controlled in office today.  Pt reports higher readings at home in the 140s/90s, bringing her back in two weeks with home BP machine to ensure accuracy.   Current regimen: chlorthalidone 25 mg, lisinopril 5 mg      Orders:    lisinopril (PRINIVIL,ZESTRIL) 5 MG tablet; Take 1 tablet (5 mg total) by mouth daily.    chlorthalidone (HYGROTON) 25 MG tablet; Take 1 tablet (25 mg total) by mouth every morning.    Hyperlipidemia, unspecified hyperlipidemia type  Chronic, unclear control  On atorvastatin, compliant  Rechecking lipid panel  Orders:    Lipid Panel    Need for immunization against influenza    Orders:    INFLUENZA VACCINE IIV3(IM)(PF)6 MOS UP    Arthritis  Continue celebrex, add voltaren gel  Orders:    diclofenac sodium (VOLTAREN) 1 % gel; Apply 2 g topically four (4) times a day.      Return in about 2 weeks (around 11/07/2023) for Nurse Visit - BP recheck, check with home machine as well.      Subjective     HPI:  Heidi Woodard is a 52 yo woman w/ PMHx HTN, HLD, desmoid fibromatosis and cancer pain who presents today for follow-up of her BP and her cholesterol. She reports consistently taking her BP meds. She notes that she'll take her BP at home and it runs in the 140s/90s. She does say that her BP machine is old but she does have an arm cuff.     She also notes that her cholesterol has been elevated. She notes that she's not had much luck at changing her diet. She is trying to eat more fruits and vegetables but notes it has been hard for her.        Objective     BP 132/80 (BP Site: L Arm, BP Position: Sitting, BP Cuff Size: Large)  - Pulse 88  - Temp 36.9 ??C (98.4 ??F) (Temporal)  - Resp 20  - Ht 157.5 cm (5' 2.01)  - Wt (!) 110.5 kg (243 lb 8 oz)  - SpO2 97%  - BMI 44.53 kg/m??   PTHomeBP           ROS:  Review of systems negative unless noted in HPI.     Physical Exam:  Physical Exam  Vitals and nursing note reviewed.   Constitutional:       Appearance: Normal appearance. She is obese.   HENT:      Head: Normocephalic and atraumatic.      Right Ear: External ear normal.      Left Ear: External ear normal.      Nose: Nose normal.      Mouth/Throat:      Mouth: Mucous membranes are moist.   Eyes:      Conjunctiva/sclera: Conjunctivae normal.   Cardiovascular:      Rate and Rhythm: Normal rate and regular rhythm.   Pulmonary:      Effort: Pulmonary effort is normal.      Breath sounds: Normal breath sounds.   Skin:     General: Skin is warm and dry.      Findings: No lesion or rash.   Neurological:      General: No focal  deficit present.      Mental Status: She is alert and oriented to person, place, and time.   Psychiatric:         Mood and Affect: Mood normal.         Behavior: Behavior normal.         Thought Content: Thought content normal.         Judgment: Judgment normal.

## 2023-10-24 ENCOUNTER — Ambulatory Visit
Admit: 2023-10-24 | Discharge: 2023-10-25 | Payer: MEDICARE | Attending: Student in an Organized Health Care Education/Training Program | Primary: Student in an Organized Health Care Education/Training Program

## 2023-10-24 DIAGNOSIS — E785 Hyperlipidemia, unspecified: Principal | ICD-10-CM

## 2023-10-24 DIAGNOSIS — I10 Essential (primary) hypertension: Principal | ICD-10-CM

## 2023-10-24 DIAGNOSIS — Z23 Encounter for immunization: Principal | ICD-10-CM

## 2023-10-24 DIAGNOSIS — M1990 Unspecified osteoarthritis, unspecified site: Principal | ICD-10-CM

## 2023-10-24 LAB — LIPID PANEL
CHOLESTEROL/HDL RATIO SCREEN: 3.3 (ref 1.0–4.5)
CHOLESTEROL: 161 mg/dL (ref <=200)
HDL CHOLESTEROL: 49 mg/dL (ref 40–60)
LDL CHOLESTEROL CALCULATED: 96 mg/dL (ref 40–100)
NON-HDL CHOLESTEROL: 112 mg/dL (ref 70–130)
TRIGLYCERIDES: 81 mg/dL (ref 0–150)
VLDL CHOLESTEROL CAL: 16.2 mg/dL (ref 11–40)

## 2023-10-24 MED ORDER — DICLOFENAC 1 % TOPICAL GEL
Freq: Four times a day (QID) | TOPICAL | 1 refills | 13.00 days | Status: CP
Start: 2023-10-24 — End: 2024-10-23

## 2023-10-24 MED ORDER — MORPHINE ER 15 MG TABLET,EXTENDED RELEASE
ORAL_TABLET | Freq: Every evening | ORAL | 0 refills | 30.00 days | Status: CP
Start: 2023-10-24 — End: ?

## 2023-10-24 MED ORDER — LISINOPRIL 5 MG TABLET
ORAL_TABLET | Freq: Every day | ORAL | 0 refills | 90.00 days | Status: CP
Start: 2023-10-24 — End: ?

## 2023-10-24 MED ORDER — OXYCODONE 5 MG TABLET
ORAL_TABLET | Freq: Four times a day (QID) | ORAL | 0 refills | 25.00 days | Status: CP | PRN
Start: 2023-10-24 — End: 2023-11-23

## 2023-10-24 MED ORDER — CHLORTHALIDONE 25 MG TABLET
ORAL_TABLET | Freq: Every morning | ORAL | 0 refills | 90.00 days | Status: CP
Start: 2023-10-24 — End: ?

## 2023-10-24 NOTE — Unmapped (Signed)
At your visit in 2 weeks, please bring your home blood pressure machine so we can check to make sure that it's readings are accurate.

## 2023-10-24 NOTE — Unmapped (Signed)
Returned call to Heidi Woodard to make aware that the medications that were requested were sent into the pharmacy on file. Left  message stating this information .     No further needs at this time

## 2023-10-24 NOTE — Unmapped (Signed)
[facility] Other Call ONC Phone Room Smart Lists: Medication Refill -     Hi,    Patient Heidi Woodard called requesting a medication refill for the following:    Medication: Oxycodone 5mg  & Morphine 15mg    Pharmacy:   [facility] [address], [place] - [address]  [address], [name] [address]  Phone: [phone]  Fax: [phone]         The expected turnaround time is 3-4 business days     Thank you,  Iona Hansen  [facility] Communication Center  [phone]    [facility]

## 2023-10-31 NOTE — Unmapped (Signed)
[facility] BONE AND SOFT TISSUE ONCOLOGY RETURN VISIT    Encounter Date: 11/01/2023  Patient Name: Heidi Woodard  Medical Record Number: [id number]    Referring Physician: Sheral Apley, MD  [address]  [facility] [address] [place],  [place] [address]    Primary Care Provider: Arthor Captain, MD      The patient reports they are physically located in [place] and is currently: at home. I conducted a audio/video visit. I spent 15 minutes on the video call with the patient. I spent an additional 15 minutes on pre- and post-visit activities on the date of service .       DIAGNOSIS:  1. Desmoid fibromatosis    2. High risk medication use    3. 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 Continue prn topical steroids  - Continue urea cream on callouses on feet and also along neck.   - MRI Left shoulder, 02/08/20, similar to decreased bulk of shoulder mass  - 05/20/20: held sorafenib in anticipation of TKR on 05/26/20  - 08/04/20: MRI Left shoulder, similar size but increase in pain/swelling. Restarted sorafenib 200 mg daily.    - 12/12/20: Continues on sorafenib 200 mg daily, doing well. Improvement in desmoid associated pain, manageable with low dose opioids. HFS, HTN, nausea under control.  -02/13/21: Pain progressively worse over past few months, requiring increasing doses of opioids. MRI is stable/slightly better. However, given her pain, referred for cryoablation, Dr. Cathie Hoops feels it is amenable.  - 06/2021: cryoablation to the L shoulder.  - 03/23/22: Scans show decrease in size and enhancement of desmoid tumor. Continue sorafenib 200mg  daily. Consider treatment holiday at 2 years after cryoablation (06/2023)  - 10/19/22: Scans show stable disease. Continue sorafenib 200mg  daily. Referred to PM&R due to pain in contralateral shoulder, may be a function/rehab issue.     03/08/23: Doing well. Reasonable tolerance aside from fatigue, HTN. MRI in 3 mos, consider treatment holiday if stable disease.    06/07/23: Scans personally reviewed, interpreted and discussed with the patient, which show stable disease. Treatment holiday from sorafenib.   Assessment & Plan  Desmoid Tumor of the Shoulder  Currently on a treatment holiday with no significant increase in pain. Discussed the importance of regular follow-up scans to monitor tumor status and potential need for future treatment adjustments based on scan results.  - Order follow-up scan in three months  - Continue current pain management regimen    Arthritis  Reports swelling and pain in the left pinky knuckle and sharp pains in the right shoulder, possibly indicative of arthritis. Plans to include both shoulders in the next scan to assess for any related issues. Discussed the potential for arthritis and the importance of imaging to confirm diagnosis.  - Include both shoulders in the next scan    Skin Pigmentation Changes  Reports skin darkening on face and hands since stopping chemotherapy, suspected to be related to medication washout. No rash or other dermatologic symptoms reported. Anticipated that pigmentation will improve over time. Advised to monitor for any new skin changes.  - Monitor for any development of rash or other skin changes    General Health Maintenance  Reports fluctuating energy levels, more energetic in the  morning but drained by the afternoon, possibly related to recent cessation of chemotherapy and other medications. Advised to monitor energy levels and adjust activities accordingly.  - Monitor energy levels and adjust daily activities accordingly    Follow-up  - Schedule follow-up visit in three months.    -Possible future options if progression: resume sorafenib 200mg , Doxil, pazopanib, nirogestat.      Desmoid tumor associated pain - improved with sorafenib, then a bit worse. Had follow up visit in May for discussion about safe prescribing, adherence to dose as prescribed, inability to do any more early refills, as she had been requesting several early refills due to escalating pain. Now stable for several months, adherent to pain management agreement.     Nighttime MS contin 15mg  (#30 per month)   Oxycodone 5mg  Q4h prn, max 3 pills daily (#100 monthly)     Dispo:  RTC 3 mos w scan.     I personally reviewed the medical records, pathology and laboratory results and viewed the imaging. All questions were answered to the patient's apparent satisfaction and they voiced understanding and agreement with the plan. Pt has my card and contact information and is encouraged to reach out with any further questions.    Donzetta Sprung, MD/MPH  Assistant Professor  Bone and Soft Tissue Oncology Program  [facility]  Pager: [phone]  Nurse Navigator: [name] RN, BSN, OCN      REASON FOR CONSULTATION:   Heidi Woodard is a 52 y.o. female who is seen in consultation at the request of Sheral Apley, MD for evaluation of her desmoid fibromatosis.      HISTORY OF PRESENT ILLNESS:      In 2016, pt developed shoulder pain associated with a fall. There was bruising and swelling but she attributed it to the injury. It never improved, and by summer she noted increased swelling and decreased ROM (which is limited by pain, rather than painless weakness). 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 She underwent CT of the left shoudler on 06/25/16 which showed a 16 cm mass involing the left deltoid with likely invasion of the left trapezius, triceps, and teres minor. Possible LN involvement of axillary and supraclav LN on left were present. On 07/07/2016 she saw Dr. Darral Dash and ultrasound-guided biopsy was performed with pathology revealing low-grade spindle cell lesion favoring fibromatosis. He felt that surgical resection would be highly morbid. On 9/20/017, she saw Dr Beckey Rutter, who did not favor RT due to inherent risks associated with RT.      She has ongoing pain, some tingling along the backs of her hands. Pain is intermittent if her arm is at rest, happening about every other day but when it happens it is very intense. Per above it limits her ADLs although she says she is managing.     Interval history:   History of Present Illness  The patient is a 52 year old individual with a history of a desmoid tumor in the left shoulder, which has been stable on recent imaging. She is currently on a treatment holiday. The patient reports a change in skin pigmentation, with darkening of the skin on the face and hands since discontinuing chemotherapy. She denies any associated itchiness, pain, or rash.    The patient also reports the development of possible arthritic symptoms, with swelling noted around the knuckle of the left pinky finger, making it difficult  to wear a ring. She has been experiencing variable energy levels, with periods of increased energy followed by feelings of being drained. The patient tries to complete tasks in the morning when energy levels are higher, anticipating fatigue later in the day.    Pain levels have been fluctuating, with good and not-so-good days. The patient has been experiencing sharp pains in the right shoulder when lifting the arm, which she suspects might be due to arthritis. The patient's desmoid tumor is on the left side. The patient has not noticed any significant increase in pain, especially on the left side where the desmoid tumor is located, since discontinuing the chemotherapy. The patient is currently managing her pain with medication, which she reports is effective.      REVIEW OF SYSTEMS:  A comprehensive review of 12 systems was negative except for pertinent positives noted in HPI.    Past Medical History, Surgical History, Family History were reviewed personally. Any changes were updated as above.    Social History:  Lives in [place], [place]  Has a supportive boyfriend  No t/e/d  38 year old daughter    Pregnancy status: perimenopausal, not sexually active    ALLERGIES/MEDICATIONS:  Reviewed in Epic    PHYSICAL EXAM:   Gen - well appearing, in no acute distress  Eyes - conjunctivae clear, PERRL  ENT - oral mucosa clear, dentition normal  Resp - symmetric excursion, normal work of breathing  Skin - no rashes, no lesions noted on visible skin  Neuro - AOx3, EOM intact, no facial droop, speech fluent and coherent, moves all extremities without asymmetry  Psych - affect normal, mood okay  MSK - no joint swelling or effusions noted      PATHOLOGY:   Pathology was personally reviewed as described in the HPI, detailed in Epic.     LABS:  Reviewed in Epic    RADIOLOGY:  Imaging was personally viewed and interpreted as summarized in the history (see above).

## 2023-11-01 ENCOUNTER — Telehealth: Admit: 2023-11-01 | Discharge: 2023-11-02 | Payer: MEDICARE

## 2023-11-01 DIAGNOSIS — D48119 Desmoid fibromatosis: Principal | ICD-10-CM

## 2023-11-04 NOTE — Unmapped (Signed)
Returned Call to [name] after receiving call from the communication center in reference to the medication Morphine 15 mg.  She said that she tried to pick it but due to new insurance they are requesting [place] to send over a new script to replace the previous script that was sent on 12/23.       Triage Recommendations:   I advised that I would reach out to Dr. Meredith Mody to see if he can resend a new script over to the [facility] pharmacy.     Caller's Response:   She  was  appreciative of the call and is agreeable to this plan and will call back with any further questions/concerns      Outstanding tasks: Care team notified

## 2023-11-04 NOTE — Unmapped (Signed)
Hi,     Heidi Woodard contacted the [employer] regarding the following:    - she went to [facility] to pick up her medication, morphine 15mg , and she was told that the provider needed to send in another prescription.     Please contact Loistine at [phone] .    Thanks in advance,    Durward Fortes  [facility]   [phone]    [facility]

## 2023-11-07 MED ORDER — MORPHINE ER 15 MG TABLET,EXTENDED RELEASE
ORAL_TABLET | Freq: Every evening | ORAL | 0 refills | 30.00 days | Status: CP
Start: 2023-11-07 — End: ?

## 2023-11-08 NOTE — Unmapped (Signed)
 Returned call to Heidi Woodard to make aware that the medication that was requested was sent into the pharmacy on file.     No further needs at this time

## 2023-11-08 NOTE — Unmapped (Signed)
Returned call to pt after speaking with her pharmacy.She has a new insurance for [facility]) which is requiring a PA.PA request has been sent to the MAP team.She is agreeable to this plan and will call back with any further questions/concerns.

## 2023-11-08 NOTE — Unmapped (Signed)
[facility] Other Call ONC Phone Room Smart Lists: General -     Hi,     Heidi Woodard contacted the Communication Center regarding the following:    - states that she is calling to speak with the care team due to still having an issue with getting her morphine at the pharmacy    Please contact [name] at [phone].    Thanks in advance,    [name]  [facility] Communication Center   [phone]    [facility]

## 2023-11-11 DIAGNOSIS — G893 Neoplasm related pain (acute) (chronic): Principal | ICD-10-CM

## 2023-11-11 NOTE — Unmapped (Signed)
Hi,     Madoline contacted the [employer] regarding the following:    - a PA is needed for the Morphine 15mg . Prescription is for 30 pills but insurance is wanting to give 7 pills.     Please contact Kisha at [phone].    Thanks in advance,    Durward Fortes  [facility] Communication Center   [phone]    [facility]

## 2023-11-14 NOTE — Unmapped (Signed)
[facility] Other Call ONC Phone Room Smart Lists: Discuss Care Plan -     Hi,     Jully contacted the Communication Center requesting to speak with the care team of Heidi Woodard to discuss:    Patient is calling to follow up about her medication that the pharmacy will not dispense to her     Please contact Dororthy  at [phone].    Thank you,   Iona Hansen  [facility] Communication Center   [phone]    [facility]

## 2023-11-14 NOTE — Unmapped (Signed)
[facility] pharmacy in [place], [place] to find out more information    She said needs a PA.  I advised that I would get an update on the PA from the MAP team and then call to update her before I call the patient back.

## 2023-11-14 NOTE — Unmapped (Signed)
[facility] Other Call ONC Phone Room Smart Lists: General -     Hi,     Heidi Woodard contacted the Communication Center regarding the following:    - Stated the the pharmacy is telling her that they cannot fill the entire prescription. Pt stated that the pharmacy stated that the insurance won't allow them to fill the entire prescription and only allows for 7 days instead of 30 days.    morphine (MS CONTIN) 15 MG 12 hr tablet [[id number]]     Please contact Heidi Woodard at [phone].    Thanks in advance,    Sharl Ma  [facility] Communication Center   [phone]    [facility]

## 2023-11-15 MED ORDER — OXYCODONE 5 MG TABLET
ORAL_TABLET | Freq: Four times a day (QID) | ORAL | 0 refills | 25.00 days | Status: CP | PRN
Start: 2023-11-15 — End: 2023-12-15

## 2023-11-15 NOTE — Unmapped (Signed)
Called pt to let her know Dr. Meredith Mody has sent in a refill for oxycodone.She will let us know if a PA is required or there are any further issues.

## 2023-11-15 NOTE — Unmapped (Signed)
Addended by: Larina Bras on: 11/15/2023 10:06 AM     Modules accepted: Orders, Level of Service

## 2023-11-15 NOTE — Unmapped (Signed)
[facility] Other Call ONC Phone Room Smart Lists: Medication Refill -     Hi,    Patient Heidi Woodard called requesting a medication refill for the following:    Medication:   oxyCODONE (ROXICODONE) 5 MG   Pharmacy:   [facility] [address] [place] - [address]  [address], [place] [address]  Phone: [phone]  Fax: [phone]         The expected turnaround time is 3-4 business days     Thank you,  [name]  [facility] Communication Center  [phone]    [facility]

## 2023-11-15 NOTE — Unmapped (Signed)
Returned call to pt. Updated her on the status of the PA. Hopefully will be completed by tomorrow.She is agreeable with this plan and will call back with any further needs.

## 2023-11-22 ENCOUNTER — Ambulatory Visit: Admit: 2023-11-22 | Discharge: 2023-11-23 | Payer: MEDICARE | Attending: Family | Primary: Family

## 2023-11-22 NOTE — Unmapped (Signed)
Patient is here for nurse visit to have blood pressure rechecked. Patient did not bring home blood pressure device nor blood pressure log, reports blood pressure device was borrowed by a family member so patient has not been able to record readings at home. Patient states she has experienced recent headaches but is unable to discern if they are from blood pressure, patient has had some recent stressful events.    Patient took lisinopril 5 mg about 30 minutes ago. Manual blood pressure reading with large cuff on left upper arm reading 118/82. Patient asks if Medicare would pay for another home bp device, patient instructed to call insurance company regarding this request.     Per Leighton Ruff, FNP continue current medication regimen and call office if readings become consistently high (over 130/90) again.

## 2023-12-09 MED ORDER — MORPHINE ER 15 MG TABLET,EXTENDED RELEASE
ORAL_TABLET | Freq: Every evening | ORAL | 0 refills | 30.00 days | Status: CP
Start: 2023-12-09 — End: ?

## 2023-12-09 MED ORDER — OXYCODONE 5 MG TABLET
ORAL_TABLET | Freq: Four times a day (QID) | ORAL | 0 refills | 25.00 days | Status: CP | PRN
Start: 2023-12-09 — End: 2024-01-08

## 2023-12-09 NOTE — Unmapped (Signed)
[facility] Other Call ONC Phone Room Smart Lists: Medication Refill -     Hi,    Patient Heidi Woodard called requesting a medication refill for the following:    Medication: oxycodone 5mg   [facility] [name]    Medication: Morphine 15mg   [facility] [name]    The expected turnaround time is 3-4 business days     Thank you,  [name]  [facility] Communication Center  [phone]    [facility]

## 2023-12-19 NOTE — Unmapped (Signed)
 [facility] Other Call ONC Phone Room Smart Lists: General -     Hi,     Heidi Woodard contacted the Communication Center regarding the following:    [facility] has informed her that Morphine 15mg  is on backorder until April  2025    Please contact Marielouise at [phone].    Thanks in advance,    Durward Fortes  [facility] Communication Center   [phone]    [facility]

## 2023-12-20 MED ORDER — OXYCODONE ER 10 MG TABLET,CRUSH RESISTANT,EXTENDED RELEASE 12 HR
ORAL_TABLET | Freq: Every evening | ORAL | 0 refills | 30.00 days | Status: CP
Start: 2023-12-20 — End: ?

## 2023-12-21 NOTE — Unmapped (Signed)
 I informed patient that Dr. Dorna Mai sent in ER Oxycodone instead due to the nation shortage of the MS Cotin    Patient was appreciative.    Thanks  [name]

## 2023-12-21 NOTE — Unmapped (Signed)
 [facility] Other Call ONC Phone Room Smart Lists: General -     Hi,     Heidi Woodard contacted the Communication Center regarding the following:    - states that she is calling in to inform the care team [facility] [place] [address] [place] [address], phone number: [phone] for her oxycodone, she states that the pharmacy told her that they need someone with the care team to call and tell them how much she will need    Please contact Heidi Woodard at [phone].    Thanks in advance,    Heidi Woodard  [facility] Communication Center   [phone]    [facility]

## 2023-12-22 MED ORDER — MORPHINE ER 15 MG TABLET,EXTENDED RELEASE
ORAL_TABLET | Freq: Every evening | ORAL | 0 refills | 30.00 days | Status: CP
Start: 2023-12-22 — End: ?

## 2023-12-22 MED ORDER — OXYCODONE 5 MG TABLET
ORAL_TABLET | Freq: Four times a day (QID) | ORAL | 0 refills | 25.00 days | Status: CP | PRN
Start: 2023-12-22 — End: 2024-01-21

## 2023-12-22 NOTE — Unmapped (Signed)
 Called pt to let her know [facility].Asked her to call back with any further needs.

## 2024-01-16 DIAGNOSIS — I10 Essential (primary) hypertension: Principal | ICD-10-CM

## 2024-01-16 MED ORDER — MORPHINE ER 15 MG TABLET,EXTENDED RELEASE
ORAL_TABLET | Freq: Every evening | ORAL | 0 refills | 30.00 days | Status: CP
Start: 2024-01-16 — End: ?

## 2024-01-16 MED ORDER — LISINOPRIL 5 MG TABLET
ORAL_TABLET | Freq: Every day | ORAL | 2 refills | 90.00 days | Status: CP
Start: 2024-01-16 — End: ?

## 2024-01-16 NOTE — Unmapped (Signed)
 Patient is requesting the following refill  Requested Prescriptions     Pending Prescriptions Disp Refills    lisinopril (PRINIVIL,ZESTRIL) 5 MG tablet [Pharmacy Med Name: Lisinopril 5 MG Oral Tablet] 90 tablet 0     Sig: Take 1 tablet by mouth once daily       Recent Visits  Date Type Provider Dept   10/24/23 Office Visit Arthor Captain, MD [facility]   02/11/23 Office Visit Catalina Lunger, DO [place] [facility]   Showing recent visits within past 365 days and meeting all other requirements  Future Appointments  No visits were found meeting these conditions.  Showing future appointments within next 365 days and meeting all other requirements       Labs: Potassium:   Potassium (mmol/L)   Date Value   06/07/2023 3.0 (L)   05/28/2021 3.9    Vitals:   BP Readings from Last 3 Encounters:   11/22/23 118/82   10/24/23 132/80   06/07/23 119/82    and   Pulse Readings from Last 3 Encounters:   11/22/23 88   10/24/23 88   06/07/23 92

## 2024-01-16 NOTE — Unmapped (Signed)
 [facility] Other Call ONC Phone Room Smart Lists: Medication Refill -     Hi,    Patient Heidi Woodard called requesting a medication refill for the following:    Medication: Morphine 15 MG  Ph[phone] [place] [place] [facility] [address]  [address] [place], [place] [address]  Phone: [phone]  Fax: [phone]         The expected turnaround time is 3-4 business days     Thank you,  Roseanne Kaufman  [facility] Communication Center  [phone]    [facility]

## 2024-01-24 MED ORDER — OXYCODONE 5 MG TABLET
ORAL_TABLET | Freq: Four times a day (QID) | ORAL | 0 refills | 25 days | PRN
Start: 2024-01-24 — End: 2024-02-23

## 2024-01-24 NOTE — Unmapped (Signed)
 [facility] Other Call ONC Phone Room Smart Lists: Medication Refill -     Hi,    Patient Heidi Woodard called requesting a medication refill for the following:    Medication:   oxyCODONE (ROXICODONE) 5 MG immediate release tablet    [phone] [place] [place] - [address]  [address] [place], [place] [address]  Phone: [phone]  Fax: [phone]       The expected turnaround time is 3-4 business days     Thank you,  Warnell Bureau  [facility] Communication Center  [phone]    [facility]

## 2024-01-30 NOTE — Unmapped (Signed)
 [facility] Other Call ONC Phone Room Smart Lists: Medication Refill -     Hi,    Patient Heidi Woodard called requesting a medication refill for the following:    Medication: oxyCODONE (ROXICODONE) 5 MG immediate release tablet [[id number]]  [phone] [place], [place] - [address]  [address] [place], [place] [address]  Phone: [phone]  Fax: [phone]    The expected turnaround time is 3-4 business days     Thank you,  Sharl Ma  [facility] Communication Center  [phone]    [facility]

## 2024-01-31 MED ORDER — OXYCODONE 5 MG TABLET
ORAL_TABLET | Freq: Four times a day (QID) | ORAL | 0 refills | 25 days | Status: CP | PRN
Start: 2024-01-31 — End: 2024-03-01

## 2024-01-31 NOTE — Unmapped (Signed)
 Returned call to Heidi Woodard to make aware that the medication that was requested was sent into the pharmacy on file.     No further needs at this time

## 2024-02-13 MED ORDER — OXYCODONE 5 MG TABLET
ORAL_TABLET | Freq: Four times a day (QID) | ORAL | 0 refills | 25.00 days | Status: CP | PRN
Start: 2024-02-13 — End: 2024-03-14

## 2024-02-13 NOTE — Unmapped (Signed)
 [facility] Other Call ONC Phone Room Smart Lists: Medication Refill -     Hi,    Patient Heidi Woodard called requesting a medication refill for the following:    Medication: oxyCODONE (ROXICODONE) 5 MG immediate release tablet & Morphine  [phone] [place] [place] - [address]  [address] [place], [place] [address]  Phone: [phone]  Fax: [phone]    The expected turnaround time is 3-4 business days     Thank you,  Noelle Batten  [facility] Communication Center  [phone]    [facility]

## 2024-02-15 NOTE — Unmapped (Signed)
 [facility] Other Call ONC Phone Room Smart Lists: Medication Refill -     Hi,    Patient Heidi Woodard called requesting a medication refill for the following:    Medication: Morphine 15mg   [facility]       The expected turnaround time is 3-4 business days     Thank you,  Marnee Sink  [facility] Communication Center  [phone]    [facility]

## 2024-02-15 NOTE — Unmapped (Signed)
 Pt is requesting refill of 1 medication.  (Morphine 15 mg)  Last refill was on  01/16/24    Last OV was on 10/23/24.

## 2024-02-17 MED ORDER — MORPHINE ER 15 MG TABLET,EXTENDED RELEASE
ORAL_TABLET | Freq: Every evening | ORAL | 0 refills | 30.00 days | Status: CP
Start: 2024-02-17 — End: ?

## 2024-02-17 NOTE — Unmapped (Signed)
 Returned call to Ms. Bugay to make aware that the medication that was requested was sent into the pharmacy on file. Left message stating this information

## 2024-02-17 NOTE — Unmapped (Signed)
 [facility] Other Call ONC Phone Room Smart Lists: Medication Refill -     Hi,    Patient Heidi Woodard called requesting a medication refill for the following:    Medication: Morphine 15 MG  Ph[phone] [place] [place] [facility] [address]  [address] [place], [place] [address]  Phone: [phone]  Fax: [phone]         The expected turnaround time is 3-4 business days     Thank you,  Roseanne Kaufman  [facility] Communication Center  [phone]    [facility]

## 2024-02-20 MED ORDER — MORPHINE ER 15 MG TABLET,EXTENDED RELEASE
ORAL_TABLET | Freq: Every evening | ORAL | 0 refills | 30.00 days | Status: CP
Start: 2024-02-20 — End: ?

## 2024-02-20 NOTE — Unmapped (Signed)
 [facility] Other Call ONC Phone Room Smart Lists: General -     Hi,     Heidi Woodard contacted the Communication Center regarding the following:    - [facility] pt [facility] is [facility] of their   morphine  (MS CONTIN ) 15 MG 12 hr tablet [[id number]]  prescription, [facility].    [id number] [place]  - [address]  [address] [place], [place] [address]  Phone: [phone]  Fax: [phone]       Please contact Evalynne at [phone].    Thanks in advance,    Noelle Batten  [facility] Communication Center   [phone]    [facility]

## 2024-02-24 ENCOUNTER — Emergency Department: Admit: 2024-02-24 | Discharge: 2024-02-24 | Disposition: A | Discharge status: Home / Self Care | Payer: MEDICARE

## 2024-02-24 DIAGNOSIS — M25511 Pain in right shoulder: Principal | ICD-10-CM

## 2024-02-24 NOTE — Unmapped (Signed)
 Emergency Department Provider Note        ED Clinical Impression     Final diagnoses:   Acute pain of right shoulder (Primary)       ED Assessment/Plan       Condition: Stable  Disposition: Discharge    This chart has been completed using Dragon Medical Dictation software, and while attempts have been made to ensure accuracy, certain words and phrases may not be transcribed as intended.     History     Chief Complaint   Patient presents with    Shoulder Pain     HPI    Heidi Woodard is a 53 y.o. female who presents today to the emergency department complaining of right shoulder pain for 2 days.  The pain is constant, 10 at its worst, 5 at its best, aggravated with cold air, movement and pressure and alleviated with Tylenol  and Motrin .  Denies any injury or known MOI.    Allergies: is allergic to amoxicillin-pot clavulanate, penicillins, amoxicillin, clavulanic acid, and penicillin v.  Medications: has a current medication list which includes the following long-term medication(s): atorvastatin , chlorthalidone , and lisinopril .  PMHx:  has a past medical history of Coronary artery disease, Hyperlipidemia, Hypertension, and Neoplasm of shoulder.  PSHx:  has a past surgical history that includes Appendectomy and Left knee surgery (Left).  SocHx:  reports that she has never smoked. She has never been exposed to tobacco smoke. She has never used smokeless tobacco. She reports that she does not drink alcohol and does not use drugs.  Allergies, Medications, Medical, Surgical, and Social History were reviewed as documented above.      Social Drivers of Health with Concerns     Alcohol Use: Not on file   Physical Activity: Not on file   Social Connections: Unknown (03/16/2022)    Received from [facility]    Social Network     Social Network: Not on file   Internet Connectivity: Not on file         Review Of Systems    Review of Systems   Constitutional:  Negative for chills and fever.   Respiratory:  Negative for shortness of breath.    Cardiovascular:  Negative for chest pain.   Gastrointestinal:  Negative for nausea and vomiting.   Musculoskeletal:         Right shoulder pain   All other systems reviewed and are negative.      Physical Exam     BP 121/86  - Pulse 73  - Temp 35.8 ??C (96.4 ??F) (Temporal)  - Resp 18  - Ht 157.5 cm (5' 2)  - Wt (!) 107.5 kg (237 lb)  - SpO2 100%  - BMI 43.35 kg/m??     Physical Exam  Vitals and nursing note reviewed.   Constitutional:       General: She is not in acute distress.     Appearance: She is well-developed.   HENT:      Head: Normocephalic and atraumatic.   Cardiovascular:      Rate and Rhythm: Normal rate and regular rhythm.      Heart sounds: Normal heart sounds.   Pulmonary:      Effort: Pulmonary effort is normal.      Breath sounds: Normal breath sounds.   Musculoskeletal:      Right shoulder: Tenderness present. No swelling or deformity. Normal pulse.      Comments: Gladys Lamp positive  Neer's positive  Full can test negative  Empty can test positive  Belly press positive  Liftoff positive  TTP along the right subscapularis and supraspinatus   Neurological:      Mental Status: She is alert and oriented to person, place, and time.   Psychiatric:         Mood and Affect: Mood normal.         Behavior: Behavior normal.         ED Course   Medical Decision Making  X-ray does not show any acute fracture or dislocation.  Advised NSAIDs and/or Tylenol , stretches and ice.  Follow-up with PCP.    I have reviewed my clinical findings and my clinical impression with the patient. The patient has expressed understanding that at this time there is no evidence for a more serious underlying process, but also understands that early in the process of a condition such as this, an initial workup can be falsely reassuring. I have counseled and discussed follow-up with the patient, stressing the importance of appropriate follow-up. I have also counseled the patient to return if symptoms worsen or if there are any concerns. Routine discharge counseling was given to the patient and the patient understands that worsening, changing or persistent symptoms should prompt an immediate call or follow up with their primary physician or return to the emergency department for reevaluation. Patient has expressed understanding.    Amount and/or Complexity of Data Reviewed  Radiology: ordered. Decision-making details documented in ED Course.    Risk  OTC drugs.  Prescription drug management.         Procedures     No results found for this visit on 02/24/24 (from the past 4464 hours).      ED Results  No results found for any visits on 02/24/24.  XR Shoulder 3 Or More Views Right  Result Date: 02/24/2024  Exam:  Right Shoulder  History:  Pain beginning 2 days ago  Technique:  3 views  Comparison:  None.  Findings:  The right humeral head is normally located. Spurring is seen at the glenohumeral joint. Mild degenerative changes at the acromioclavicular joint. No focal osseous lesion. No fracture is seen. Visualized portions of the right lung and ribs appear normal.      Degenerative changes as above.  Signed (Electronic Signature): 02/24/2024 1:31 PM Signed By: Marne Sings, MD      Medications Administered: Medications - No data to display    Discharge Medications (Medications Prescribed during this  ED visit and Patient's Home Medications) :      Your Medication List        ASK your doctor about these medications      atorvastatin  20 MG tablet  Commonly known as: LIPITOR  Take 1 tablet (20 mg total) by mouth daily.     celecoxib  200 MG capsule  Commonly known as: CeleBREX   Take 1 capsule (200 mg total) by mouth in the morning.     chlorthalidone  25 MG tablet  Commonly known as: HYGROTON   Take 1 tablet (25 mg total) by mouth every morning.     cyclobenzaprine  10 MG tablet  Commonly known as: FLEXERIL   Take 1 tablet (10 mg total) by mouth in the morning.     diclofenac  sodium 1 % gel  Commonly known as: VOLTAREN   Apply 2 g topically four (4) times a day.     lisinopril  5 MG tablet  Commonly known as: PRINIVIL ,ZESTRIL   Take 1 tablet by mouth once daily  miscellaneous medical supply Misc  Check blood pressure every morning and evening     morphine  15 MG 12 hr tablet  Commonly known as: MS Contin   Take 1 tablet (15 mg total) by mouth nightly.     ondansetron  8 MG tablet  Commonly known as: ZOFRAN   Take 1 tablet (8 mg total) by mouth every eight (8) hours as needed for nausea.     oxyCODONE  5 MG immediate release tablet  Commonly known as: ROXICODONE   Take 1 tablet (5 mg total) by mouth every six (6) hours as needed for pain (moderate to severe pain).     potassium gluconate 2.5 mEq Tab  Take 1 tablet by mouth.     therapeutic multivitamin tablet  Commonly known as: THERAGRAN  Take 1 tablet by mouth daily.     urea  10 % cream  Commonly known as: CARMOL  APPLY NIGHTLY TO AFFECTED PRESSURE CALLOUS ON FOOT     urea -alpha hydroxy acids 10-4 % Crea  Commonly known as: ATRAC-TAIN  APPLY NIGHTLY TO AFFECTED PRESSURE CALLOUS ON FOOT                 Placido Brightly, [place]  02/24/24 2217

## 2024-02-24 NOTE — Unmapped (Signed)
 Right shoulder pain started 2 days ago. No injury

## 2024-02-27 DIAGNOSIS — I10 Essential (primary) hypertension: Principal | ICD-10-CM

## 2024-02-27 MED ORDER — CHLORTHALIDONE 25 MG TABLET
ORAL_TABLET | Freq: Every morning | ORAL | 0 refills | 90.00000 days
Start: 2024-02-27 — End: ?

## 2024-02-27 NOTE — Unmapped (Signed)
 Patient is requesting the following refill  Requested Prescriptions     Pending Prescriptions Disp Refills    chlorthalidone  (HYGROTON ) 25 MG tablet [Pharmacy Med Name: Chlorthalidone  25 MG Oral Tablet] 90 tablet 0     Sig: TAKE 1 TABLET BY MOUTH ONCE DAILY IN THE MORNING       Recent Visits  Date Type Provider Dept   10/24/23 Office Visit Edmond Gosselin, MD [facility] Family [facility] [place]   Showing recent visits within past 365 days and meeting all other requirements  Future Appointments  No visits were found meeting these conditions.  Showing future appointments within next 365 days and meeting all other requirements       Labs: Creatinine:   Creatinine (mg/dL)   Date Value   16/08/9603 0.79   05/28/2021 0.96    Potassium:   Potassium (mmol/L)   Date Value   06/07/2023 3.0 (L)   05/28/2021 3.9    Sodium:   Sodium (mmol/L)   Date Value   06/07/2023 139   05/28/2021 138    Vitals:   BP Readings from Last 3 Encounters:   02/24/24 121/86   11/22/23 118/82   10/24/23 132/80    and   Pulse Readings from Last 3 Encounters:   02/24/24 73   11/22/23 88   10/24/23 88    Pregnancy:     No LMP recorded. Patient is postmenopausal.

## 2024-02-27 NOTE — Unmapped (Signed)
 Patient is requesting the following refill. Prescription(s) refilled per protocol.   Requested Prescriptions     Pending Prescriptions Disp Refills    chlorthalidone  (HYGROTON ) 25 MG tablet [Pharmacy Med Name: Chlorthalidone  25 MG Oral Tablet] 90 tablet 0     Sig: TAKE 1 TABLET BY MOUTH ONCE DAILY IN THE MORNING       Recent Visits  Date Type Provider Dept   10/24/23 Office Visit Edmond Gosselin, MD [facility] [place]   Showing recent visits within past 365 days and meeting all other requirements  Future Appointments  No visits were found meeting these conditions.  Showing future appointments within next 365 days and meeting all other requirements       Labs: Creatinine:   Creatinine (mg/dL)   Date Value   16/08/9603 0.79   05/28/2021 0.96    Potassium:   Potassium (mmol/L)   Date Value   06/07/2023 3.0 (L)   05/28/2021 3.9    Sodium:   Sodium (mmol/L)   Date Value   06/07/2023 139   05/28/2021 138    Vitals:   BP Readings from Last 3 Encounters:   02/24/24 121/86   11/22/23 118/82   10/24/23 132/80    and   Pulse Readings from Last 3 Encounters:   02/24/24 73   11/22/23 88   10/24/23 88

## 2024-02-28 DIAGNOSIS — D48119 Desmoid fibromatosis: Principal | ICD-10-CM

## 2024-02-28 DIAGNOSIS — G893 Neoplasm related pain (acute) (chronic): Principal | ICD-10-CM

## 2024-02-28 NOTE — Unmapped (Signed)
 [facility] Other Call ONC Phone Room Smart Lists: General -     Hi,     Dejanee contacted the Communication Center regarding the following:    - Patient requesting return call regarding rescheduling appointment for today    Please contact Arthur at [phone].    Thanks in advance,    Almeta Jacobus  [facility]   [phone]    [facility]

## 2024-02-28 NOTE — Unmapped (Signed)
 Returned call to pt. States she cannot make her appts today.Requesting they be rescheduled.Let her know I would relay the message.Asked her to call back with any further needs.

## 2024-02-29 NOTE — Unmapped (Signed)
 I spoke with patient Heidi Woodard to confirm appointments on the following date(s): 6/10 MRI at [facility], Labs and visit with Vallie Gay.        Jacqueline P Linares

## 2024-03-01 DIAGNOSIS — M25511 Pain in right shoulder: Principal | ICD-10-CM

## 2024-03-06 ENCOUNTER — Inpatient Hospital Stay: Admit: 2024-03-06 | Discharge: 2024-03-07 | Payer: MEDICARE

## 2024-03-15 MED ORDER — OXYCODONE 5 MG TABLET
ORAL_TABLET | Freq: Four times a day (QID) | ORAL | 0 refills | 25.00000 days | Status: CP | PRN
Start: 2024-03-15 — End: 2024-04-14

## 2024-03-15 MED ORDER — MORPHINE ER 15 MG TABLET,EXTENDED RELEASE
ORAL_TABLET | Freq: Every evening | ORAL | 0 refills | 30.00000 days | Status: CP
Start: 2024-03-15 — End: ?

## 2024-03-15 NOTE — Unmapped (Signed)
 Copied from CRM #[id number]. Topic: Medication Refill - Medication Refill  >> Mar 15, 2024 11:34 AM Garnell Jurist wrote:      Hi,    Patient Heidi Woodard called requesting a medication refill for the following:    ?? Medication:   morphine  (MS CONTIN ) 15 MG 12 hr tablet   oxyCODONE  (ROXICODONE ) 5 MG immediate release tablet    ?? Pharmacy: [facility] [phone] - [place] [place] - [address]  [address] [place] [address]  Phone: [phone] Fax: [phone]  Hours: Not open 24 hours          The expected turnaround time is 3-4 business days     Thank you,  Delsie Figures  [facility]  [phone]

## 2024-03-15 NOTE — Unmapped (Signed)
 Called pt to let her know her refills were sent as requested.Asked her to call back with any further needs.

## 2024-04-02 ENCOUNTER — Encounter (HOSPITAL_BASED_OUTPATIENT_CLINIC_OR_DEPARTMENT_OTHER): Payer: Self-pay | Admitting: Orthopedic Surgery

## 2024-04-02 ENCOUNTER — Other Ambulatory Visit: Payer: Self-pay

## 2024-04-04 ENCOUNTER — Encounter (HOSPITAL_BASED_OUTPATIENT_CLINIC_OR_DEPARTMENT_OTHER)
Admission: RE | Admit: 2024-04-04 | Discharge: 2024-04-04 | Disposition: A | Discharge status: Home / Self Care | Source: Ambulatory Visit | Attending: Orthopedic Surgery | Admitting: Orthopedic Surgery

## 2024-04-04 DIAGNOSIS — Z01810 Encounter for preprocedural cardiovascular examination: Secondary | ICD-10-CM | POA: Diagnosis present

## 2024-04-04 DIAGNOSIS — Z01818 Encounter for other preprocedural examination: Secondary | ICD-10-CM | POA: Insufficient documentation

## 2024-04-04 DIAGNOSIS — Z01812 Encounter for preprocedural laboratory examination: Secondary | ICD-10-CM | POA: Diagnosis present

## 2024-04-04 DIAGNOSIS — R9431 Abnormal electrocardiogram [ECG] [EKG]: Secondary | ICD-10-CM | POA: Insufficient documentation

## 2024-04-04 LAB — BASIC METABOLIC PANEL WITH GFR
Anion gap: 11 (ref 5–15)
BUN: 19 mg/dL (ref 6–20)
CO2: 25 mmol/L (ref 22–32)
Calcium: 9.6 mg/dL (ref 8.9–10.3)
Chloride: 103 mmol/L (ref 98–111)
Creatinine, Ser: 0.85 mg/dL (ref 0.44–1.00)
GFR, Estimated: >60 mL/min (ref >60)
Glucose, Bld: 105 mg/dL — ABNORMAL HIGH (ref 70–99)
Potassium: 3.6 mmol/L (ref 3.5–5.1)
Sodium: 139 mmol/L (ref 135–145)

## 2024-04-06 NOTE — H&P (Signed)
 PREOPERATIVE H&P  Chief Complaint: ROTATOR CUFF TEAR x2, OA, BURSITIS BICEP TENDONITIS ADHESIVE CAPSULITIS, RIGHT SHOULDER  HPI: Connie Kidd is a 53 y.o. female who presents with a diagnosis of ROTATOR CUFF TEAR x2, OA, BURSITIS BICEP TENDONITIS ADHESIVE CAPSULITIS, RIGHT SHOULDER. Symptoms are rated as moderate to severe, and have been worsening.  This is significantly impairing activities of daily living.  She has elected for surgical management.   Past Medical History:  Diagnosis Date   Arthritis    Hypertension    Past Surgical History:  Procedure Laterality Date   APPENDECTOMY     Social History   Socioeconomic History   Marital status: Single    Spouse name: Not on file   Number of children: Not on file   Years of education: Not on file   Highest education level: Not on file  Occupational History   Not on file  Tobacco Use   Smoking status: Never   Smokeless tobacco: Never  Substance and Sexual Activity   Alcohol use: Never   Drug use: Never   Sexual activity: Not on file  Other Topics Concern   Not on file  Social History Narrative   Not on file   Social Drivers of Health   Financial Resource Strain: Low Risk  (02/11/2023)   Received from [facility]   Overall Financial Resource Strain (CARDIA)    Difficulty of Paying Living Expenses: Not very hard  Food Insecurity: No Food Insecurity (10/24/2023)   Received from [facility]   Hunger Vital Sign    Worried About Running Out of Food in the Last Year: Never true    Ran Out of Food in the Last Year: Never true  Transportation Needs: No Transportation Needs (10/24/2023)   Received from [facility]   PRAPARE - Transportation    Lack of Transportation (Medical): No    Lack of Transportation (Non-Medical): No  Physical Activity: Not on file  Stress: No Stress Concern Present (10/24/2023)   Received from [facility]  Questionnaire    Feeling of Stress : Not at all  Social Connections: Unknown (03/16/2022)   Received from [facility]   Social Network    Social Network: Not on file   History reviewed. No pertinent family history. Allergies  Allergen Reactions   Augmentin [Amoxicillin-Pot Clavulanate] Rash   Prior to Admission medications   Medication Sig Start Date End Date Taking? Authorizing Provider  chlorthalidone (HYGROTON) 25 MG tablet Take 25 mg by mouth daily.   Yes [provider]     Positive ROS: All other systems have been reviewed and were otherwise negative with the exception of those mentioned in the HPI and as above.  Physical Exam: General: Alert, no acute distress Cardiovascular: No pedal edema Respiratory: No cyanosis, no use of accessory musculature GI: No organomegaly, abdomen is soft and non-tender Skin: No lesions in the area of chief complaint Neurologic: Sensation intact distally Psychiatric: Patient is competent for consent with normal mood and affect Lymphatic: No axillary or cervical lymphadenopathy  MUSCULOSKELETAL: TTP right shoulder, limited ROM in all directions, decreased strength, + O'Brien's test, + hawkins test, NVI   Imaging: MRI shows high grade tearing of the footprint of the supraspinatus and infraspinatus, mild subscapularis tendinosis without high grade tear, moderate intra-articular biceps tendinosis, superior and posterior superior labral tearing, rotator interval infiltration   Assessment: ROTATOR CUFF TEAR x2, OA, BURSITIS  BICEP TENDONITIS ADHESIVE CAPSULITIS, RIGHT SHOULDER  Plan: Plan for Procedure(s): ARTHROSCOPY, SHOULDER, WITH ROTATOR CUFF REPAIR ARTHROSCOPY, SHOULDER WITH DEBRIDEMENT DECOMPRESSION, SUBACROMIAL SPACE TENODESIS, BICEPS  The risks benefits and alternatives were discussed with the patient including but not limited to the risks of nonoperative treatment, versus surgical intervention including  infection, bleeding, nerve injury,  blood clots, cardiopulmonary complications, morbidity, mortality, among others, and they were willing to proceed.   Weightbearing: NWB RUE Orthopedic devices: sling Showering: POD 3 Dressing: reinforce PRN Medicines: ASA, Oxy, Tylenol, Mobic, Baclofen, Zofran  Discharge: home Follow up: 04/20/24 at 9:45am    Lore Rode, PA-C Office [phone] 04/06/2024 2:00 PM

## 2024-04-10 ENCOUNTER — Ambulatory Visit: Admit: 2024-04-10 | Payer: MEDICARE

## 2024-04-10 ENCOUNTER — Encounter (HOSPITAL_BASED_OUTPATIENT_CLINIC_OR_DEPARTMENT_OTHER): Payer: Self-pay | Admitting: Orthopedic Surgery

## 2024-04-10 ENCOUNTER — Encounter (HOSPITAL_BASED_OUTPATIENT_CLINIC_OR_DEPARTMENT_OTHER)
Admission: RE | Disposition: A | Discharge status: Home / Self Care | Payer: Self-pay | Source: Home / Self Care | Attending: Orthopedic Surgery

## 2024-04-10 ENCOUNTER — Other Ambulatory Visit: Payer: Self-pay

## 2024-04-10 ENCOUNTER — Ambulatory Visit (HOSPITAL_BASED_OUTPATIENT_CLINIC_OR_DEPARTMENT_OTHER): Admitting: Anesthesiology

## 2024-04-10 ENCOUNTER — Ambulatory Visit (HOSPITAL_BASED_OUTPATIENT_CLINIC_OR_DEPARTMENT_OTHER)
Admission: RE | Admit: 2024-04-10 | Discharge: 2024-04-10 | Disposition: A | Discharge status: Home / Self Care | Attending: Orthopedic Surgery | Admitting: Orthopedic Surgery

## 2024-04-10 DIAGNOSIS — M7501 Adhesive capsulitis of right shoulder: Secondary | ICD-10-CM | POA: Insufficient documentation

## 2024-04-10 DIAGNOSIS — E66813 Obesity, class 3: Secondary | ICD-10-CM | POA: Diagnosis not present

## 2024-04-10 DIAGNOSIS — M7521 Bicipital tendinitis, right shoulder: Secondary | ICD-10-CM | POA: Diagnosis not present

## 2024-04-10 DIAGNOSIS — X58XXXA Exposure to other specified factors, initial encounter: Secondary | ICD-10-CM | POA: Insufficient documentation

## 2024-04-10 DIAGNOSIS — M25811 Other specified joint disorders, right shoulder: Secondary | ICD-10-CM | POA: Diagnosis not present

## 2024-04-10 DIAGNOSIS — M75101 Unspecified rotator cuff tear or rupture of right shoulder, not specified as traumatic: Secondary | ICD-10-CM | POA: Diagnosis not present

## 2024-04-10 DIAGNOSIS — M19011 Primary osteoarthritis, right shoulder: Secondary | ICD-10-CM | POA: Diagnosis not present

## 2024-04-10 DIAGNOSIS — I10 Essential (primary) hypertension: Secondary | ICD-10-CM | POA: Insufficient documentation

## 2024-04-10 DIAGNOSIS — S46011A Strain of muscle(s) and tendon(s) of the rotator cuff of right shoulder, initial encounter: Secondary | ICD-10-CM | POA: Diagnosis present

## 2024-04-10 DIAGNOSIS — Z6841 Body Mass Index (BMI) 40.0 and over, adult: Secondary | ICD-10-CM | POA: Diagnosis not present

## 2024-04-10 DIAGNOSIS — Z01818 Encounter for other preprocedural examination: Secondary | ICD-10-CM

## 2024-04-10 HISTORY — PX: SHOULDER ARTHROSCOPY WITH ROTATOR CUFF REPAIR: SHX5685

## 2024-04-10 HISTORY — PX: BICEPT TENODESIS: SHX5116

## 2024-04-10 HISTORY — PX: POSTERIOR LUMBAR FUSION 2 WITH HARDWARE REMOVAL: SHX7297

## 2024-04-10 HISTORY — PX: SUBACROMIAL DECOMPRESSION: SHX5174

## 2024-04-10 SURGERY — ARTHROSCOPY, SHOULDER, WITH ROTATOR CUFF REPAIR
Anesthesia: Regional | Site: Shoulder | Laterality: Right

## 2024-04-10 MED ORDER — ROCURONIUM BROMIDE 100 MG/10ML IV SOLN
INTRAVENOUS | Status: DC | PRN
  Administered 2024-04-10: 50 mg via INTRAVENOUS

## 2024-04-10 MED ORDER — MIDAZOLAM HCL 2 MG/2ML IJ SOLN
1.0000 mg | Freq: Once | INTRAMUSCULAR | Status: AC
  Administered 2024-04-10: 1 mg via INTRAVENOUS

## 2024-04-10 MED ORDER — FENTANYL CITRATE (PF) 100 MCG/2ML IJ SOLN
INTRAMUSCULAR | Status: AC
  Filled 2024-04-10: qty 2

## 2024-04-10 MED ORDER — PHENYLEPHRINE HCL-NACL 20-0.9 MG/250ML-% IV SOLN
INTRAVENOUS | Status: DC | PRN
Start: 2024-04-10 — End: 2024-04-10
  Administered 2024-04-10: 40 ug/min via INTRAVENOUS

## 2024-04-10 MED ORDER — DEXAMETHASONE SODIUM PHOSPHATE 10 MG/ML IJ SOLN: 8.0000 mg | Freq: Once | INTRAMUSCULAR | Status: DC

## 2024-04-10 MED ORDER — FENTANYL CITRATE (PF) 100 MCG/2ML IJ SOLN: 25.0000 ug | INTRAMUSCULAR | Status: DC | PRN

## 2024-04-10 MED ORDER — SUGAMMADEX SODIUM 200 MG/2ML IV SOLN
INTRAVENOUS | Status: DC | PRN
  Administered 2024-04-10: 200 mg via INTRAVENOUS

## 2024-04-10 MED ORDER — ACETAMINOPHEN 500 MG PO TABS
ORAL_TABLET | ORAL | Status: AC
  Filled 2024-04-10: qty 2

## 2024-04-10 MED ORDER — ONDANSETRON HCL 4 MG/2ML IJ SOLN
INTRAMUSCULAR | Status: DC | PRN
  Administered 2024-04-10 (×2): 4 mg via INTRAVENOUS

## 2024-04-10 MED ORDER — DEXAMETHASONE SODIUM PHOSPHATE 4 MG/ML IJ SOLN
INTRAMUSCULAR | Status: DC | PRN
  Administered 2024-04-10: 8 mg via INTRAVENOUS

## 2024-04-10 MED ORDER — LIDOCAINE HCL (CARDIAC) PF 100 MG/5ML IV SOSY
PREFILLED_SYRINGE | INTRAVENOUS | Status: DC | PRN
  Administered 2024-04-10: 60 mg via INTRAVENOUS

## 2024-04-10 MED ORDER — OXYCODONE HCL 5 MG PO TABS: 5.0000 mg | ORAL_TABLET | Freq: Once | ORAL | Status: DC | PRN

## 2024-04-10 MED ORDER — ACETAMINOPHEN 500 MG PO TABS
1000.0000 mg | ORAL_TABLET | Freq: Once | ORAL | Status: AC
  Administered 2024-04-10: 1000 mg via ORAL

## 2024-04-10 MED ORDER — ASPIRIN 81 MG PO TBEC: 81.0000 mg | DELAYED_RELEASE_TABLET | Freq: Two times a day (BID) | ORAL | 0 refills | Status: DC

## 2024-04-10 MED ORDER — BACLOFEN 10 MG PO TABS: 10.0000 mg | ORAL_TABLET | Freq: Three times a day (TID) | ORAL | 0 refills | Status: DC | PRN

## 2024-04-10 MED ORDER — PHENYLEPHRINE HCL (PRESSORS) 10 MG/ML IV SOLN
INTRAVENOUS | Status: DC | PRN
  Administered 2024-04-10 (×2): 160 ug via INTRAVENOUS

## 2024-04-10 MED ORDER — ONDANSETRON 4 MG PO TBDP: 4.0000 mg | ORAL_TABLET | Freq: Three times a day (TID) | ORAL | 0 refills | Status: AC | PRN

## 2024-04-10 MED ORDER — POVIDONE-IODINE 10 % EX SWAB
2.0000 | Freq: Once | CUTANEOUS | Status: AC
  Administered 2024-04-10: 2 via TOPICAL

## 2024-04-10 MED ORDER — FENTANYL CITRATE (PF) 100 MCG/2ML IJ SOLN
INTRAMUSCULAR | Status: DC | PRN
  Administered 2024-04-10 (×2): 50 ug via INTRAVENOUS

## 2024-04-10 MED ORDER — EPHEDRINE SULFATE (PRESSORS) 50 MG/ML IJ SOLN
INTRAMUSCULAR | Status: DC | PRN
  Administered 2024-04-10: 5 mg via INTRAVENOUS
  Administered 2024-04-10: 10 mg via INTRAVENOUS

## 2024-04-10 MED ORDER — MIDAZOLAM HCL 2 MG/2ML IJ SOLN
INTRAMUSCULAR | Status: AC
  Filled 2024-04-10: qty 2

## 2024-04-10 MED ORDER — HYDROCODONE-ACETAMINOPHEN 10-325 MG PO TABS: 1.0000 | ORAL_TABLET | Freq: Four times a day (QID) | ORAL | 0 refills | Status: DC | PRN

## 2024-04-10 MED ORDER — CEFAZOLIN SODIUM-DEXTROSE 2-4 GM/100ML-% IV SOLN
INTRAVENOUS | Status: AC
  Filled 2024-04-10: qty 100

## 2024-04-10 MED ORDER — ONDANSETRON HCL 4 MG/2ML IJ SOLN: 4.0000 mg | Freq: Four times a day (QID) | INTRAMUSCULAR | Status: DC | PRN

## 2024-04-10 MED ORDER — PROPOFOL 10 MG/ML IV BOLUS
INTRAVENOUS | Status: DC | PRN
  Administered 2024-04-10: 150 mg via INTRAVENOUS

## 2024-04-10 MED ORDER — CEFAZOLIN SODIUM-DEXTROSE 2-4 GM/100ML-% IV SOLN
2.0000 g | INTRAVENOUS | Status: AC
  Administered 2024-04-10: 2 g via INTRAVENOUS

## 2024-04-10 MED ORDER — BUPIVACAINE LIPOSOME 1.3 % IJ SUSP
INTRAMUSCULAR | Status: DC | PRN
  Administered 2024-04-10: 10 mL via PERINEURAL

## 2024-04-10 MED ORDER — FENTANYL CITRATE (PF) 100 MCG/2ML IJ SOLN
50.0000 ug | Freq: Once | INTRAMUSCULAR | Status: AC
  Administered 2024-04-10: 50 ug via INTRAVENOUS

## 2024-04-10 MED ORDER — OXYCODONE HCL 5 MG/5ML PO SOLN: 5.0000 mg | Freq: Once | ORAL | Status: DC | PRN

## 2024-04-10 MED ORDER — MELOXICAM 15 MG PO TABS: 15.0000 mg | ORAL_TABLET | Freq: Every day | ORAL | 0 refills | Status: DC | PRN

## 2024-04-10 MED ORDER — LACTATED RINGERS IV SOLN: INTRAVENOUS | Status: DC

## 2024-04-10 MED ORDER — BUPIVACAINE HCL (PF) 0.5 % IJ SOLN
INTRAMUSCULAR | Status: DC | PRN
  Administered 2024-04-10: 15 mL via PERINEURAL

## 2024-04-10 MED ORDER — SODIUM CHLORIDE 0.9 % IR SOLN
Status: DC | PRN
  Administered 2024-04-10: 18000 mL

## 2024-04-10 SURGICAL SUPPLY — 47 items
ANCHOR FIBERTAK RC 2.6 (BLUE) (Anchor) IMPLANT
ANCHOR SUT BIO SW 4.75X19.1 (Anchor) IMPLANT
ANCHOR SUT FBRTK 2.6X1.7X2 (Anchor) IMPLANT
BLADE EXCALIBUR 4.0X13 (MISCELLANEOUS) IMPLANT
BLADE SHAVER TORPEDO 4X13 (MISCELLANEOUS) IMPLANT
BURR OVAL 8 FLU 5.0X13 (MISCELLANEOUS) ×1 IMPLANT
CANNULA 5.75X71 LONG (CANNULA) IMPLANT
CANNULA TWIST IN 8.25X7CM (CANNULA) IMPLANT
CHLORAPREP W/TINT 26 (MISCELLANEOUS) ×1 IMPLANT
DISSECTOR 3.8MM X 13CM (MISCELLANEOUS) ×1 IMPLANT
DRAPE IMP U-DRAPE 54X76 (DRAPES) ×1 IMPLANT
DRAPE INCISE IOBAN 66X45 STRL (DRAPES) ×1 IMPLANT
DRAPE POUCH INSTRU U-SHP 10X18 (DRAPES) ×1 IMPLANT
DRAPE SHOULDER BEACH CHAIR (DRAPES) ×1 IMPLANT
DRAPE U-SHAPE 47X51 STRL (DRAPES) ×1 IMPLANT
DRSG EMULSION OIL 3X3 NADH (GAUZE/BANDAGES/DRESSINGS) ×1 IMPLANT
GAUZE PAD ABD 8X10 STRL (GAUZE/BANDAGES/DRESSINGS) ×1 IMPLANT
GAUZE SPONGE 4X4 12PLY STRL (GAUZE/BANDAGES/DRESSINGS) ×2 IMPLANT
GLOVE BIO SURGEON STRL SZ7.5 (GLOVE) ×1 IMPLANT
GLOVE BIOGEL PI IND STRL 7.5 (GLOVE) ×2 IMPLANT
GLOVE BIOGEL PI IND STRL 8 (GLOVE) ×1 IMPLANT
GLOVE SURG SYN 7.5 E (GLOVE) ×1 IMPLANT
GLOVE SURG SYN 7.5 PF PI (GLOVE) ×1 IMPLANT
GOWN STRL REUS W/ TWL LRG LVL3 (GOWN DISPOSABLE) ×1 IMPLANT
GOWN STRL REUS W/ TWL XL LVL3 (GOWN DISPOSABLE) ×1 IMPLANT
GOWN STRL REUS W/TWL XL LVL3 (GOWN DISPOSABLE) ×1 IMPLANT
KIT PUSHLOCK 2.9 HIP (KITS) IMPLANT
MANIFOLD NEPTUNE II (INSTRUMENTS) ×1 IMPLANT
NDL HD SCORPION MEGA LOADER (NEEDLE) IMPLANT
NS IRRIG 1000ML POUR BTL (IV SOLUTION) IMPLANT
PACK ARTHROSCOPY DSU (CUSTOM PROCEDURE TRAY) ×1 IMPLANT
PACK BASIN DAY SURGERY FS (CUSTOM PROCEDURE TRAY) ×1 IMPLANT
RESTRAINT HEAD UNIVERSAL NS (MISCELLANEOUS) ×1 IMPLANT
SLEEVE SCD COMPRESS KNEE MED (STOCKING) ×1 IMPLANT
SLING ARM FOAM STRAP LRG (SOFTGOODS) IMPLANT
SUT ETHILON 3 0 PS 1 (SUTURE) ×1 IMPLANT
SUT MNCRL AB 4-0 PS2 18 (SUTURE) IMPLANT
SUT TIGER TAPE 7 IN WHITE (SUTURE) IMPLANT
SUTURE FIBERWR #2 38 T-5 BLUE (SUTURE) IMPLANT
SUTURE TAPE TIGERLINK 1.3MM BL (SUTURE) IMPLANT
SYSTEM IMPL TENODESIS LNT 2.9 (Orthopedic Implant) IMPLANT
TAPE FIBER 2MM 7IN #2 BLUE (SUTURE) IMPLANT
TOWEL GREEN STERILE FF (TOWEL DISPOSABLE) ×1 IMPLANT
TUBE CONNECTING 20X1/4 (TUBING) ×2 IMPLANT
TUBING ARTHROSCOPY IRRIG 16FT (MISCELLANEOUS) ×1 IMPLANT
WAND ABLATOR APOLLO I90 (BUR) ×1 IMPLANT
WATER STERILE IRR 1000ML POUR (IV SOLUTION) ×1 IMPLANT

## 2024-04-10 NOTE — Anesthesia Procedure Notes (Signed)
 Procedure Name: Intubation Date/Time: 04/10/2024 9:33 AM  Performed by: Lonia Ro, CRNAPre-anesthesia Checklist: Patient identified, Emergency Drugs available, Suction available, Patient being monitored and Timeout performed Patient Re-evaluated:Patient Re-evaluated prior to induction Oxygen Delivery Method: Circle system utilized Preoxygenation: Pre-oxygenation with 100% oxygen Induction Type: IV induction Ventilation: Mask ventilation without difficulty Laryngoscope Size: Mac and 3 Grade View: Grade I Tube type: Oral Tube size: 7.0 mm Number of attempts: 1 Airway Equipment and Method: Stylet Placement Confirmation: ETT inserted through vocal cords under direct vision, positive ETCO2 and breath sounds checked- equal and bilateral Secured at: 22 cm Tube secured with: Tape Dental Injury: Teeth and Oropharynx as per pre-operative assessment

## 2024-04-10 NOTE — Anesthesia Procedure Notes (Signed)
 Anesthesia Regional Block: Interscalene brachial plexus block   Pre-Anesthetic Checklist: , timeout performed,  Correct Patient, Correct Site, Correct Laterality,  Correct Procedure, Correct Position, site marked,  Risks and benefits discussed,  Surgical consent,  Pre-op evaluation,  At surgeon's request and post-op pain management  Laterality: Right  Prep: chloraprep       Needles:  Injection technique: Single-shot  Needle Type: Echogenic Stimulator Needle     Needle Length: 5cm  Needle Gauge: 22     Additional Needles:   Procedures:, nerve stimulator,,,,,     Nerve Stimulator or Paresthesia:  Response: biceps flexion, 0.45 mA  Additional Responses:   Narrative:  Start time: 04/10/2024 8:52 AM End time: 04/10/2024 8:59 AM Injection made incrementally with aspirations every 5 mL.  Performed by: Personally  Anesthesiologist: Ellena Gurney, MD  Additional Notes: Functioning IV was confirmed and monitors were applied.  A 50mm 22ga Arrow echogenic stimulator needle was used. Sterile prep and drape,hand hygiene and sterile gloves were used.  Negative aspiration and negative test dose prior to incremental administration of local anesthetic. The patient tolerated the procedure well.  Ultrasound guidance: relevent anatomy identified, needle position confirmed, local anesthetic spread visualized around nerve(s), vascular puncture avoided.  Image printed for medical record.

## 2024-04-10 NOTE — Transfer of Care (Signed)
 Immediate Anesthesia Transfer of Care Note  Patient: Connie Kidd  Procedure(s) Performed: ARTHROSCOPY, SHOULDER, WITH ROTATOR CUFF REPAIR (Right: Shoulder) ARTHROSCOPY, SHOULDER WITH DEBRIDEMENT (Right: Shoulder) DECOMPRESSION, SUBACROMIAL SPACE (Right: Shoulder) TENODESIS, BICEPS (Right: Shoulder)  Patient Location: PACU  Anesthesia Type:General  Level of Consciousness: awake, alert , oriented, and patient cooperative  Airway & Oxygen Therapy: Patient Spontanous Breathing and Patient connected to nasal cannula oxygen  Post-op Assessment: Report given to RN and Post -op Vital signs reviewed and stable  Post vital signs: Reviewed and stable  Last Vitals:  Vitals Value Taken Time  BP 136/81 04/10/24 1115  Temp    Pulse 83 04/10/24 1118  Resp 13 04/10/24 1119  SpO2 99 % 04/10/24 1118  Vitals shown include unfiled device data.  Last Pain:  Vitals:   04/10/24 0754  TempSrc: Temporal  PainSc: 6          Complications: No notable events documented.

## 2024-04-10 NOTE — Interval H&P Note (Signed)
 History and Physical Interval Note:  04/10/2024 6:55 AM  Connie Kidd  has presented today for surgery, with the diagnosis of ROTATOR CUFF TEAR x2, OA, BURSITIS BICEP TENDONITIS ADHESIVE CAPSULITIS, RIGHT SHOULDER.  The various methods of treatment have been discussed with the patient and family. After consideration of risks, benefits and other options for treatment, the patient has consented to  Procedure(s): ARTHROSCOPY, SHOULDER, WITH ROTATOR CUFF REPAIR (Right) ARTHROSCOPY, SHOULDER WITH DEBRIDEMENT (Right) DECOMPRESSION, SUBACROMIAL SPACE (Right) TENODESIS, BICEPS (Right) as a surgical intervention.  The patient's history has been reviewed, patient examined, no change in status, stable for surgery.  I have reviewed the patient's chart and labs.  Questions were answered to the patient's satisfaction.     [name]

## 2024-04-10 NOTE — Progress Notes (Signed)
Assisted Dr. Chaney Malling with right, interscalene , ultrasound guided block. Side rails up, monitors on throughout procedure. See vital signs in flow sheet. Tolerated Procedure well.

## 2024-04-10 NOTE — Interval H&P Note (Signed)
 History and Physical Interval Note:  04/10/2024 8:53 AM  Connie Kidd  has presented today for surgery, with the diagnosis of ROTATOR CUFF TEAR x2, OA, BURSITIS BICEP TENDONITIS ADHESIVE CAPSULITIS, RIGHT SHOULDER.  The various methods of treatment have been discussed with the patient and family. After consideration of risks, benefits and other options for treatment, the patient has consented to  Procedure(s): ARTHROSCOPY, SHOULDER, WITH ROTATOR CUFF REPAIR (Right) ARTHROSCOPY, SHOULDER WITH DEBRIDEMENT (Right) DECOMPRESSION, SUBACROMIAL SPACE (Right) TENODESIS, BICEPS (Right) as a surgical intervention.  The patient's history has been reviewed, patient examined, no change in status, stable for surgery.  I have reviewed the patient's chart and labs.  Questions were answered to the patient's satisfaction.     [name]

## 2024-04-10 NOTE — Discharge Instructions (Addendum)
 POST-OPERATIVE OPIOID TAPER INSTRUCTIONS: It is important to wean off of your opioid medication as soon as possible. If you do not need pain medication after your surgery it is ok to stop day one. Opioids include: Codeine, Hydrocodone(Norco, Vicodin), Oxycodone(Percocet, oxycontin) and hydromorphone amongst others.  Long term and even short term use of opiods can cause: Increased pain response Dependence Constipation Depression Respiratory depression And more.  Withdrawal symptoms can include Flu like symptoms Nausea, vomiting And more Techniques to manage these symptoms Hydrate well Eat regular healthy meals Stay active Use relaxation techniques(deep breathing, meditating, yoga) Do Not substitute Alcohol to help with tapering If you have been on opioids for less than two weeks and do not have pain than it is ok to stop all together.  Plan to wean off of opioids This plan should start within one week post op of your joint replacement. Maintain the same interval or time between taking each dose and first decrease the dose.  Cut the total daily intake of opioids by one tablet each day Next start to increase the time between doses. The last dose that should be eliminated is the evening dose.     Post Anesthesia Home Care Instructions  Activity: Get plenty of rest for the remainder of the day. A responsible individual must stay with you for 24 hours following the procedure.  For the next 24 hours, DO NOT: -Drive a car -Advertising copywriter -Drink alcoholic beverages -Take any medication unless instructed by your physician -Make any legal decisions or sign important papers.  Meals: Start with liquid foods such as gelatin or soup. Progress to regular foods as tolerated. Avoid greasy, spicy, heavy foods. If nausea and/or vomiting occur, drink only clear liquids until the nausea and/or vomiting subsides. Call your physician if vomiting continues.  Special Instructions/Symptoms: Your  throat may feel dry or sore from the anesthesia or the breathing tube placed in your throat during surgery. If this causes discomfort, gargle with warm salt water. The discomfort should disappear within 24 hours.  If you had a scopolamine patch placed behind your ear for the management of post- operative nausea and/or vomiting:  1. The medication in the patch is effective for 72 hours, after which it should be removed.  Wrap patch in a tissue and discard in the trash. Wash hands thoroughly with soap and water. 2. You may remove the patch earlier than 72 hours if you experience unpleasant side effects which may include dry mouth, dizziness or visual disturbances. 3. Avoid touching the patch. Wash your hands with soap and water after contact with the patch.    Regional Anesthesia Blocks  1. You may not be able to move or feel the "blocked" extremity after a regional anesthetic block. This may last may last from 3-48 hours after placement, but it will go away. The length of time depends on the medication injected and your individual response to the medication. As the nerves start to wake up, you may experience tingling as the movement and feeling returns to your extremity. If the numbness and inability to move your extremity has not gone away after 48 hours, please call your surgeon.   2. The extremity that is blocked will need to be protected until the numbness is gone and the strength has returned. Because you cannot feel it, you will need to take extra care to avoid injury. Because it may be weak, you may have difficulty moving it or using it. You may not know what position it  is in without looking at it while the block is in effect.  3. For blocks in the legs and feet, returning to weight bearing and walking needs to be done carefully. You will need to wait until the numbness is entirely gone and the strength has returned. You should be able to move your leg and foot normally before you try and bear  weight or walk. You will need someone to be with you when you first try to ensure you do not fall and possibly risk injury.  4. Bruising and tenderness at the needle site are common side effects and will resolve in a few days.  5. Persistent numbness or new problems with movement should be communicated to the surgeon or the [facility] [phone] [facility] [phone]).  Last Tylenol given at 7:57am today

## 2024-04-10 NOTE — Anesthesia Preprocedure Evaluation (Signed)
 Anesthesia Evaluation  Patient identified by MRN, date of birth, ID band Patient awake    Reviewed: Allergy & Precautions, H&P , NPO status , Patient's Chart, lab work & pertinent test results  Airway Mallampati: II   Neck ROM: full    Dental   Pulmonary neg pulmonary ROS   breath sounds clear to auscultation       Cardiovascular hypertension,  Rhythm:regular Rate:Normal     Neuro/Psych    GI/Hepatic   Endo/Other    Class 3 obesity  Renal/GU      Musculoskeletal  (+) Arthritis ,    Abdominal   Peds  Hematology   Anesthesia Other Findings   Reproductive/Obstetrics                             Anesthesia Physical Anesthesia Plan  ASA: 2  Anesthesia Plan: General   Post-op Pain Management: Regional block*   Induction: Intravenous  PONV Risk Score and Plan: 3 and Ondansetron, Dexamethasone, Midazolam and Treatment may vary due to age or medical condition  Airway Management Planned: Oral ETT  Additional Equipment:   Intra-op Plan:   Post-operative Plan: Extubation in OR  Informed Consent: I have reviewed the patients History and Physical, chart, labs and discussed the procedure including the risks, benefits and alternatives for the proposed anesthesia with the patient or authorized representative who has indicated his/her understanding and acceptance.     Dental advisory given  Plan Discussed with: CRNA, Anesthesiologist and Surgeon  Anesthesia Plan Comments:        Anesthesia Quick Evaluation

## 2024-04-10 NOTE — Op Note (Signed)
 04/10/2024  11:26 AM  PATIENT:  Connie Kidd    PRE-OPERATIVE DIAGNOSIS:  ROTATOR CUFF TEAR x2, OA, BURSITIS BICEP TENDONITIS ADHESIVE CAPSULITIS, RIGHT SHOULDER  POST-OPERATIVE DIAGNOSIS:  Same  PROCEDURE:  ARTHROSCOPY, SHOULDER, WITH ROTATOR CUFF REPAIR, ARTHROSCOPY, SHOULDER WITH DEBRIDEMENT, DECOMPRESSION, SUBACROMIAL SPACE, TENODESIS, BICEPS  SURGEON:  [name], MD  ASSISTANT: Marzella Solan, PA-C, he was present and scrubbed throughout the case, critical for completion in a timely fashion, and for retraction, instrumentation, and closure.   ANESTHESIA:   General  PREOPERATIVE INDICATIONS:  Connie Kidd is a  53 y.o. female with a diagnosis of ROTATOR CUFF TEAR x2, OA, BURSITIS BICEP TENDONITIS ADHESIVE CAPSULITIS, RIGHT SHOULDER who failed conservative measures and elected for surgical management.    The risks benefits and alternatives were discussed with the patient preoperatively including but not limited to the risks of infection, bleeding, nerve injury, cardiopulmonary complications, the need for revision surgery, among others, and the patient was willing to proceed.  DIAGNOSES: Right shoulder, acute traumatic rotator cuff tear, biceps tendinitis, AC arthritis, subacromial impingement, and arthrofibrosis.  POST-OPERATIVE DIAGNOSIS: same  PROCEDURE: Arthroscopic extensive debridement - 29823 Subdeltoid Bursa, Supraspinatus Tendon, Anterior Labrum, Superior Labrum, and synovium Arthroscopic distal clavicle excision - 16109 Arthroscopic subacromial decompression - 29826 Arthroscopic rotator cuff repair - 29827 Arthroscopic biceps tenodesis - 29828 Manipulation with lysis of adhesions 29825  Infraspinatus cuff repair 60454    BLOOD LOSS: minimal  COMPLICATIONS: None  OPERATIVE PROCEDURE:  Patient was identified in the preoperative holding area and site was marked by me He was transported to the operating theater and placed on the table in beach  chair position taking care to pad all bony prominences. After a preincinduction time out anesthesia was induced. The right upper extremity was prepped and draped in normal sterile fashion and a pre-incision timeout was performed. Connie Kidd received Ancef for preoperative antibiotics.   I performed a firm but gentle manipulation under anesthesia and had palpable breakup of scar tissue and improvement in range of motion.  Initially made a posterior arthroscopic portal and inserted the arthroscope into the glenohumeral joint. tour of the joint demonstrated the above operative findings  I created an anterior portal just lateral to the coracoid under direct visualization using a spinal needle.  I performed a lysis of adhesions  I performed an extensive debridement of the scarred synovial tissue and remaining structures   I used a combination of biter and shaver to release the biceps tendon from the superior labrum and then used the shaver to debride the superior labrum to a smooth rim.  I performed a loop and tack biceps tenodesis  I then introduced the arthroscope into the subacromial space and brought the shaver into the anterior portal. I debrided the bursa for appropriate visualization.  I then performed a subacromial decompression using combination of the shaver ArthroCare and burr using a cutting block technique. As happy with the final elevation of the subacromial space on multiple portal views.   Next I turned my attention to the distal clavicle and through the anterior portal using the bur and shaver I was able to perform a distal clavicle excision. I then switched portals and inserted the arthroscope into the anterior portal and was happy with an appropriate resection of the distal clavicle.  Next I performed a 2 x 2 dual row repair of the supraspinatus tendon  Next I performed a repair of the infraspinatus tendon including it in the dual row repair of  the supraspinatus  I was  happy with the tendon apposition and there was minimal to no dog ear.  Next I removed all arthroscopic equipment expressed all fluid and closed the portals with a nylon stitch. A sterile dressing was applied the patient was taken the PACU in stable condition.  POST OPERATIVE PLAN: The patient will be in a sling full-time and keep the dressings clean dry and intact. DVT prophylaxis will consist of early ambulation

## 2024-04-11 ENCOUNTER — Encounter (HOSPITAL_BASED_OUTPATIENT_CLINIC_OR_DEPARTMENT_OTHER): Payer: Self-pay | Admitting: Orthopedic Surgery

## 2024-04-11 NOTE — Anesthesia Postprocedure Evaluation (Signed)
 Anesthesia Post Note  Patient: Connie Kidd  Procedure(s) Performed: ARTHROSCOPY, SHOULDER, WITH ROTATOR CUFF REPAIR (Right: Shoulder) ARTHROSCOPY, SHOULDER WITH DEBRIDEMENT (Right: Shoulder) DECOMPRESSION, SUBACROMIAL SPACE (Right: Shoulder) TENODESIS, BICEPS (Right: Shoulder)     Patient location during evaluation: PACU Anesthesia Type: Regional and General Level of consciousness: awake and alert Pain management: pain level controlled Vital Signs Assessment: post-procedure vital signs reviewed and stable Respiratory status: spontaneous breathing, nonlabored ventilation, respiratory function stable and patient connected to nasal cannula oxygen Cardiovascular status: blood pressure returned to baseline and stable Postop Assessment: no apparent nausea or vomiting Anesthetic complications: no   No notable events documented.  Last Vitals:  Vitals:   04/10/24 1130 04/10/24 1205  BP: (!) 145/84 (!) 150/87  Pulse: 81 83  Resp: 18 16  Temp:  (!) 36.1 C  SpO2: 99% 94%    Last Pain:  Vitals:   04/11/24 0918  TempSrc:   PainSc: 0-No pain                 Jazariah Teall S

## 2024-04-16 DIAGNOSIS — I10 Essential (primary) hypertension: Principal | ICD-10-CM

## 2024-04-16 MED ORDER — MORPHINE ER 15 MG TABLET,EXTENDED RELEASE
ORAL_TABLET | Freq: Every evening | ORAL | 0 refills | 30.00000 days | Status: CN
Start: 2024-04-16 — End: ?

## 2024-04-16 MED ORDER — OXYCODONE 5 MG TABLET
ORAL_TABLET | Freq: Four times a day (QID) | ORAL | 0 refills | 25.00000 days | Status: CP | PRN
Start: 2024-04-16 — End: 2024-05-16

## 2024-04-16 MED ORDER — LISINOPRIL 5 MG TABLET
ORAL_TABLET | Freq: Every day | ORAL | 1 refills | 90.00000 days | Status: CP
Start: 2024-04-16 — End: ?

## 2024-04-16 NOTE — Unmapped (Signed)
 Patient is requesting the following refill  Requested Prescriptions     Pending Prescriptions Disp Refills    lisinopril  (PRINIVIL ,ZESTRIL ) 5 MG tablet 90 tablet 2     Sig: Take 1 tablet (5 mg total) by mouth daily.       Recent Visits  Date Type Provider Dept   10/24/23 Office Visit Edmond Gosselin, MD [facility]   Showing recent visits within past 365 days and meeting all other requirements  Future Appointments  No visits were found meeting these conditions.  Showing future appointments within next 365 days and meeting all other requirements       Labs: Potassium:   Potassium (mmol/L)   Date Value   06/07/2023 3.0 (L)   05/28/2021 3.9    Vitals:   BP Readings from Last 3 Encounters:   02/24/24 121/86   11/22/23 118/82   10/24/23 132/80    and   Pulse Readings from Last 3 Encounters:   02/24/24 73   11/22/23 88   10/24/23 88

## 2024-04-16 NOTE — Unmapped (Signed)
 Copied from CRM #[id number]. Topic: Medication Refill - Medication Refill  >> Apr 16, 2024  2:33 PM Waldo Guitar wrote:      Hi,    Patient Heidi Woodard called requesting a medication refill for the following:    ?? Medication: Oxycodone  5mg  and Morphine  15 mg  ?? [phone] [place],  - [address]  [address] [place], [place] [address]  Phone: [phone]  Fax: [phone]         The expected turnaround time is 3-4 business days     Thank you,  Steven Elam  [facility]  [phone]

## 2024-04-18 MED ORDER — ATORVASTATIN 20 MG TABLET
ORAL_TABLET | Freq: Every day | ORAL | 1 refills | 90.00000 days | Status: CP
Start: 2024-04-18 — End: 2025-04-18

## 2024-04-18 NOTE — Unmapped (Signed)
 Patient is requesting the following refill  Requested Prescriptions     Pending Prescriptions Disp Refills    atorvastatin  (LIPITOR) 20 MG tablet 90 tablet 3     Sig: Take 1 tablet (20 mg total) by mouth daily.       Recent Visits  Date Type Provider Dept   10/24/23 Office Visit Edmond Gosselin, MD [facility]   Showing recent visits within past 365 days and meeting all other requirements  Future Appointments  No visits were found meeting these conditions.  Showing future appointments within next 365 days and meeting all other requirements       Labs: Not applicable this refill

## 2024-05-09 MED ORDER — NALOXONE 4 MG/ACTUATION NASAL SPRAY
Freq: Once | NASAL | 2 refills | 0.00000 days | Status: CP | PRN
Start: 2024-05-09 — End: ?

## 2024-05-09 MED ORDER — MORPHINE ER 15 MG TABLET,EXTENDED RELEASE
ORAL_TABLET | Freq: Every evening | ORAL | 0 refills | 30.00000 days | Status: CP
Start: 2024-05-09 — End: ?

## 2024-05-09 MED ORDER — OXYCODONE 5 MG TABLET
ORAL_TABLET | Freq: Four times a day (QID) | ORAL | 0 refills | 25.00000 days | Status: CP | PRN
Start: 2024-05-09 — End: 2024-06-08

## 2024-05-09 NOTE — Unmapped (Signed)
 Copied from CRM #[id number]. Topic: Medication Refill - Medication Refill  >> May 09, 2024  9:26 AM Gwendlyn FALCON wrote:      Erskin,    Patient Heidi Woodard called requesting a medication refill for the following:    ?? Medication: morphine  (MS CONTIN ) 15 MG 12 hr tablet , oxyCODONE  (ROXICODONE ) 5 MG immediate release tablet  ?? [phone] [place], [place] - [address]  [address] [place], [place] [address]  Phone: [phone]  Fax: [phone]       The expected turnaround time is 3-4 business days.    Thank you,  Gwendlyn Janne Coup  [facility]  [phone]

## 2024-05-28 NOTE — Unmapped (Signed)
 [facility] BONE AND SOFT TISSUE ONCOLOGY RETURN VISIT    Encounter Date: 05/29/2024  Patient Name: Heidi Woodard  Medical Record Number: [id number]    Referring Physician: Loris Lang Masters, MD  [address]  [facility] [address] [place],  [place] [address]    Primary Care Provider: Drew Domino, NP      DIAGNOSIS:  1. Desmoid fibromatosis    2. High risk medication use    3. 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 Continue prn topical steroids  - Continue urea  cream on callouses on feet and also along neck.   - MRI Left shoulder, 02/08/20, similar to decreased bulk of shoulder mass  - 05/20/20: held sorafenib  in anticipation of TKR on 05/26/20  - 08/04/20: MRI Left shoulder, similar size but increase in pain/swelling. Restarted sorafenib  200 mg daily.    - 12/12/20: Continues on sorafenib  200 mg daily, doing well. Improvement in desmoid associated pain, manageable with low dose opioids. HFS, HTN, nausea under control.  -02/13/21: Pain progressively worse over past few months, requiring increasing doses of opioids. MRI is stable/slightly better. However, given her pain, referred for cryoablation, Dr. Babara feels it is amenable.  - 06/2021: cryoablation to the L shoulder.  - 03/23/22: Scans show decrease in size and enhancement of desmoid tumor. Continue sorafenib  200mg  daily. Consider treatment holiday at 2 years after cryoablation (06/2023)  - 10/19/22: Scans show stable disease. Continue sorafenib  200mg  daily. Referred to PM&R due to pain in contralateral shoulder, may be a function/rehab issue.     03/08/23: Doing well. Reasonable tolerance aside from fatigue, HTN. MRI in 3 mos, consider treatment holiday if stable disease.    06/07/23: Scans show stable disease. Treatment holiday from sorafenib .     05/29/24: Last MRI with R shoulder rotator cuff tendinosis. No evidence of desmoid change.  Assessment & Plan  Desmoid fibromatosis of left shoulder  Desmoid fibromatosis of the left shoulder shows a moderate decrease in size on recent MRI, indicating a positive response to previous treatment with sorafenib  and subsequent observation. The tumor is not spherical, making precise measurement challenging, but overall, the imaging suggests significant shrinkage. She has been off treatment for approximately one year, and the current strategy of observation appears effective. Discussed the potential for inflammation in the body to affect pain perception, though it should not cause tumor growth. Introduced the possibility of a new medication, nirogacestat, which may be considered in the future if treatment becomes necessary, noting it may be gentler than sorafenib .  - Continue observation of desmoid fibromatosis.  - Plan for MRI scan annually unless symptoms change.  - Schedule follow-up in six months.  - Discuss nirogacestat as a potential future treatment option if needed.    Postoperative pain of right shoulder  Postoperative pain in the right shoulder persists six weeks after surgery. Pain is currently managed with medication on an as-needed basis. Encouraged to gradually reduce pain medication to prevent tolerance and maintain effective pain management. Emphasized the importance of maintaining function while minimizing medication use.  - Continue pain medication as needed.  - Gradually reduce pain medication dosage over the next few months.    -Possible future options if progression: resume sorafenib  200mg , Doxil, pazopanib, nirogestat.  Desmoid tumor associated pain - improved with sorafenib , then a bit worse. Had follow up visit in May for discussion about safe prescribing, adherence to dose as prescribed, inability to do any more early refills, as she had been requesting several early refills due to escalating pain. Now stable for several months, adherent to pain management agreement.     Nighttime MS contin  15mg  (#30 per month)   Oxycodone  5mg  Q4h prn, max 3 pills daily (#100 monthly)  05/29/24: Discussed weaning pain medication as able, given stable desmoid off therapy. She will try.     Dispo:  RTC 6 mos wo scan.     I personally reviewed the medical records, pathology and laboratory results and viewed the imaging. All questions were answered to the patient's apparent satisfaction and they voiced understanding and agreement with the plan. Pt has my card and contact information and is encouraged to reach out with any further questions.    Kyson Kupper, MD/MPH  Assistant Professor  Bone and Soft Tissue Oncology Program  [facility]  Pager: [phone]  Nurse Navigator: Corean Bracket RN, BSN, OCN      REASON FOR CONSULTATION:   Heidi Woodard is a 53 y.o. female who is seen in consultation at the request of Loris Lang Masters, MD for evaluation of her desmoid fibromatosis.      HISTORY OF PRESENT ILLNESS:      In 2016, pt developed shoulder pain associated with a fall. There was bruising and swelling but she attributed it to the injury. It never improved, and by summer she noted increased swelling and decreased ROM (which is limited by pain, rather than painless weakness). 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 She underwent CT of the left shoudler on 06/25/16 which showed a 16 cm mass involing the left deltoid with likely invasion of the left trapezius, triceps, and teres minor. Possible LN involvement of axillary and supraclav LN on left were present. On 07/07/2016 she saw Dr. Mardeen and ultrasound-guided biopsy was performed with pathology revealing low-grade spindle cell lesion favoring fibromatosis. He felt that surgical resection would be highly morbid. On 9/20/017, she saw Dr Tepper, who did not favor RT due to inherent risks associated with RT.      She has ongoing pain, some tingling along the backs of her hands. Pain is intermittent if her arm is at rest, happening about every other day but when it happens it is very intense. Per above it limits her ADLs although she says she is managing.     Interval history:   History of Present Illness  Paitlyn Mcclatchey is a 53 year old female with desmoid fibromatosis who presents for MRI review.    She has a history of desmoid fibromatosis of the left shoulder, previously treated with sorafenib , and has been on observation for approximately one year.    Six weeks ago, she underwent right shoulder surgery due to significant pain and MRI findings. During the recent MRI, she experienced discomfort and had to be removed from the machine due to pain when her shoulder was pulled back. Despite this, the images were confirmed to be of good quality.    She experiences fluctuations in pain, with the right shoulder currently being more problematic. She is taking pain medication as needed.      REVIEW OF SYSTEMS:  A comprehensive review of 12 systems was negative  except for pertinent positives noted in HPI.    Past Medical History, Surgical History, Family History were reviewed personally. Any changes were updated as above.    Social History:  Lives in [place], [place]  Has a supportive boyfriend  No t/e/d  36 year old daughter    Pregnancy status: perimenopausal, not sexually active    ALLERGIES/MEDICATIONS:  Reviewed in Epic    PHYSICAL EXAM:   Gen - well appearing, in no acute distress  Eyes - conjunctivae clear, PERRL  ENT - oral mucosa clear, dentition normal  Resp - symmetric excursion, normal work of breathing  Skin - no rashes, no lesions noted on visible skin  Neuro - AOx3, EOM intact, no facial droop, speech fluent and coherent, moves all extremities without asymmetry  Psych - affect normal, mood okay  MSK - no joint swelling or effusions noted      PATHOLOGY:   Pathology was personally reviewed as described in the HPI, detailed in Epic.     LABS:  Reviewed in Epic    RADIOLOGY:  Imaging was personally viewed and interpreted as summarized in the history (see above).

## 2024-05-29 ENCOUNTER — Ambulatory Visit: Admit: 2024-05-29 | Discharge: 2024-05-29 | Payer: MEDICARE

## 2024-05-29 ENCOUNTER — Other Ambulatory Visit: Admit: 2024-05-29 | Discharge: 2024-05-29 | Payer: MEDICARE

## 2024-05-29 ENCOUNTER — Inpatient Hospital Stay: Admit: 2024-05-29 | Discharge: 2024-05-29 | Payer: MEDICARE

## 2024-05-29 DIAGNOSIS — D48119 Desmoid fibromatosis: Principal | ICD-10-CM

## 2024-05-29 DIAGNOSIS — G893 Neoplasm related pain (acute) (chronic): Principal | ICD-10-CM

## 2024-05-29 DIAGNOSIS — Z79899 Other long term (current) drug therapy: Principal | ICD-10-CM

## 2024-05-29 LAB — CBC W/ AUTO DIFF
BASOPHILS ABSOLUTE COUNT: 0.1 10*9/L (ref 0.0–0.1)
BASOPHILS RELATIVE PERCENT: 0.6 %
EOSINOPHILS ABSOLUTE COUNT: 1.4 10*9/L — ABNORMAL HIGH (ref 0.0–0.5)
EOSINOPHILS RELATIVE PERCENT: 10.8 %
HEMATOCRIT: 35.4 % (ref 34.0–44.0)
HEMOGLOBIN: 12 g/dL (ref 11.3–14.9)
LYMPHOCYTES ABSOLUTE COUNT: 3.4 10*9/L (ref 1.1–3.6)
LYMPHOCYTES RELATIVE PERCENT: 26.5 %
MEAN CORPUSCULAR HEMOGLOBIN CONC: 33.8 g/dL (ref 32.0–36.0)
MEAN CORPUSCULAR HEMOGLOBIN: 29.8 pg (ref 25.9–32.4)
MEAN CORPUSCULAR VOLUME: 88 fL (ref 77.6–95.7)
MEAN PLATELET VOLUME: 7.8 fL (ref 6.8–10.7)
MONOCYTES ABSOLUTE COUNT: 0.7 10*9/L (ref 0.3–0.8)
MONOCYTES RELATIVE PERCENT: 5.1 %
NEUTROPHILS ABSOLUTE COUNT: 7.3 10*9/L (ref 1.8–7.8)
NEUTROPHILS RELATIVE PERCENT: 57 %
PLATELET COUNT: 361 10*9/L (ref 150–450)
RED BLOOD CELL COUNT: 4.02 10*12/L (ref 3.95–5.13)
RED CELL DISTRIBUTION WIDTH: 13.8 % (ref 12.2–15.2)
WBC ADJUSTED: 12.8 10*9/L — ABNORMAL HIGH (ref 3.6–11.2)

## 2024-05-29 LAB — COMPREHENSIVE METABOLIC PANEL
ALBUMIN: 4 g/dL (ref 3.4–5.0)
ALKALINE PHOSPHATASE: 111 U/L (ref 46–116)
ALT (SGPT): 12 U/L (ref 10–49)
ANION GAP: 10 mmol/L (ref 5–14)
AST (SGOT): 16 U/L (ref <=34)
BILIRUBIN TOTAL: 0.3 mg/dL (ref 0.3–1.2)
BLOOD UREA NITROGEN: 26 mg/dL — ABNORMAL HIGH (ref 9–23)
BUN / CREAT RATIO: 26
CALCIUM: 9.8 mg/dL (ref 8.7–10.4)
CHLORIDE: 104 mmol/L (ref 98–107)
CO2: 28 mmol/L (ref 20.0–31.0)
CREATININE: 1.01 mg/dL (ref 0.55–1.02)
EGFR CKD-EPI (2021) FEMALE: 66 mL/min/1.73m2 (ref >=60)
GLUCOSE RANDOM: 98 mg/dL (ref 70–179)
POTASSIUM: 3.6 mmol/L (ref 3.4–4.8)
PROTEIN TOTAL: 7.4 g/dL (ref 5.7–8.2)
SODIUM: 142 mmol/L (ref 135–145)

## 2024-05-29 MED ADMIN — gadopiclenol (ELUCIREM,VUEWAY) injection 10 mL: 10 mL | INTRAVENOUS | @ 17:00:00 | Stop: 2024-05-29

## 2024-05-30 MED ORDER — OXYCODONE 5 MG TABLET
ORAL_TABLET | Freq: Four times a day (QID) | ORAL | 0 refills | 25.00000 days | PRN
Start: 2024-05-30 — End: 2024-06-29

## 2024-05-30 NOTE — Unmapped (Signed)
 Copied from CRM #[id number]. Topic: Medication Refill - Medication Refill  >> May 30, 2024  9:17 AM Nanetta SAILOR wrote:      Hi,    Patient Heidi Woodard called requesting a medication refill for the following:    ?? Medication: Oxycodone  5mg   ?? [id number] [place]       The expected turnaround time is 3-4 business days     Thank you,  Nanetta CHRISTELLA Seeds  [facility] Communication [facility]  [phone]

## 2024-05-31 MED ORDER — OXYCODONE 5 MG TABLET
ORAL_TABLET | Freq: Four times a day (QID) | ORAL | 0 refills | 25.00000 days | Status: CP | PRN
Start: 2024-05-31 — End: 2024-06-30

## 2024-06-18 MED ORDER — MORPHINE ER 15 MG TABLET,EXTENDED RELEASE
ORAL_TABLET | Freq: Every evening | ORAL | 0 refills | 30.00000 days
Start: 2024-06-18 — End: ?

## 2024-06-18 MED ORDER — OXYCODONE 5 MG TABLET
ORAL_TABLET | Freq: Four times a day (QID) | ORAL | 0 refills | 25.00000 days | PRN
Start: 2024-06-18 — End: 2024-07-18

## 2024-06-18 NOTE — Unmapped (Signed)
 Copied from CRM #[id number]. Topic: Medication Refill - Medication Refill  >> Jun 18, 2024  4:03 PM Gwendlyn FALCON wrote:      Hi,    Patient Heidi Woodard called requesting a medication refill for the following:    ?? Medication: morphine  (MS CONTIN ) 15 MG 12 hr tablet,   oxyCODONE  (ROXICODONE ) 5 MG immediate release tablet   ?? [facility] [address], [place], [place] [address]  Ph: [phone], Fax: N/A    The expected turnaround time is 3-4 business days.     Thank you,  Gwendlyn Janne Coup  [facility]  [phone]

## 2024-06-19 MED ORDER — OXYCODONE 5 MG TABLET
ORAL_TABLET | Freq: Four times a day (QID) | ORAL | 0 refills | 25.00000 days | Status: CP | PRN
Start: 2024-06-19 — End: 2024-07-19

## 2024-06-19 MED ORDER — MORPHINE ER 15 MG TABLET,EXTENDED RELEASE
ORAL_TABLET | Freq: Every evening | ORAL | 0 refills | 30.00000 days | Status: CP
Start: 2024-06-19 — End: ?

## 2024-06-19 NOTE — Unmapped (Signed)
 Left V/M that refills have been sent in as requested. Asked her to call back with any further needs.

## 2024-06-21 NOTE — Unmapped (Signed)
 Copied from CRM #[id number]. Topic: Access To Clinicians - Re[facility] Clinic Call Back  >> Jun 21, 2024 11:00 AM Jon Sharps wrote:  Reason of the Call: appt for well woman    Requesting: call back    Supporting details:n/a    Appointment: n/a      The patient preferred contact: Cell Phone Telephone Information:  Mobile          [phone]    Routine callback turnaround time: 24-48 business hours. Programmer, systems Notified)

## 2024-07-05 ENCOUNTER — Encounter: Admit: 2024-07-05 | Discharge: 2024-07-05 | Payer: MEDICARE

## 2024-07-05 DIAGNOSIS — Z23 Encounter for immunization: Principal | ICD-10-CM

## 2024-07-05 DIAGNOSIS — I10 Essential (primary) hypertension: Principal | ICD-10-CM

## 2024-07-05 DIAGNOSIS — Z1211 Encounter for screening for malignant neoplasm of colon: Principal | ICD-10-CM

## 2024-07-05 DIAGNOSIS — Z124 Encounter for screening for malignant neoplasm of cervix: Principal | ICD-10-CM

## 2024-07-05 LAB — BASIC METABOLIC PANEL
ANION GAP: 9 mmol/L (ref 3–11)
BLOOD UREA NITROGEN: 22 mg/dL — ABNORMAL HIGH (ref 8–20)
BUN / CREAT RATIO: 21
CALCIUM: 9.2 mg/dL (ref 8.5–10.1)
CHLORIDE: 101 mmol/L (ref 98–107)
CO2: 30.1 mmol/L (ref 21.0–32.0)
CREATININE: 1.03 mg/dL (ref 0.60–1.10)
EGFR CKD-EPI (2021) FEMALE: 64 mL/min/1.73m2 (ref >=60)
GLUCOSE RANDOM: 93 mg/dL (ref 70–99)
POTASSIUM: 3.6 mmol/L (ref 3.5–5.0)
SODIUM: 140 mmol/L (ref 135–145)

## 2024-07-05 LAB — ALBUMIN / CREATININE URINE RATIO
ALBUMIN QUANT URINE: 4.2 mg/dL
ALBUMIN/CREATININE RATIO: 27.4 ug/mg
CREATININE, URINE: 153.4 mg/dL

## 2024-07-05 MED ORDER — OLMESARTAN 20 MG TABLET
ORAL_TABLET | Freq: Every day | ORAL | 11 refills | 30.00000 days | Status: CP
Start: 2024-07-05 — End: 2025-07-05

## 2024-07-05 NOTE — Unmapped (Addendum)
 Heidi Woodard,    It is lovely to meet you.     Losing weight is very hard. In my experience, patients have three powerful levers to pull with their diet to help. ALL patients benefit from adding more protein to their diet. In your case, I would target AT LEAST 160g of protein/day. To give you an idea a normal chicken breast is about 40g.   The other thing I'd recommend is trying to keep daily carbohydrates under 100g/day.    The app Cronometer is an excellent free tool.     For your BP, let's transition from Lisinopril  to Olmesartan . This is an equally effective but somewhat safer medication.    The surgery clinic upstairs will call you to schedule your colonoscopy.    We will send off your pap smear today.     Lynwood KATHEE Gasman, MD

## 2024-07-05 NOTE — Unmapped (Addendum)
 Well-controlled today, however will transition from lisinopril  to olmesartan  given favorable side effect profile.  -BMP today and again at 4-week follow-up  Orders:    olmesartan  (BENICAR ) 20 MG tablet; Take 1 tablet (20 mg total) by mouth daily.    Basic metabolic panel; Future    Albumin/creatinine urine ratio; Future

## 2024-07-05 NOTE — Unmapped (Signed)
 Assessment and Plan:   Assessment/Plan:              Assessment & Plan  Class 3 severe obesity with serious comorbidity and body mass index (BMI) of 40.0 to 44.9 in adult, unspecified obesity type (CMS-HCC)  Motivated to lose weight. Not interested in medication assistance at this time.  - Download chronometer app  - Target >160g protein daily and <100g carbs daily   - 4-week follow-up to monitor progress       Primary hypertension  Well-controlled today, however will transition from lisinopril  to olmesartan  given favorable side effect profile.  -BMP today and again at 4-week follow-up  Orders:    olmesartan  (BENICAR ) 20 MG tablet; Take 1 tablet (20 mg total) by mouth daily.    Basic metabolic panel; Future    Albumin/creatinine urine ratio; Future         Return in about 4 weeks (around 08/02/2024), or BP fu.       Subjective:     Chief Complaint   Patient presents with    Medicare Wellness     Fasting.       History of Present Illness             YEP:Inmnuyb Heidi Woodard 53 y.o.female  is being seen for a complete physical exam.    Menstrual concerns: perimenopausal been roughly 6 months since last period  Contraception: Condoms discussed     Objective:   Physical Exam             Physical Exam  Vitals and nursing note reviewed. Exam conducted with a chaperone present.   Constitutional:       General: She is not in acute distress.  Cardiovascular:      Rate and Rhythm: Normal rate and regular rhythm.   Pulmonary:      Effort: Pulmonary effort is normal. No respiratory distress.   Genitourinary:     General: Normal vulva.      Vagina: Normal.      Cervix: Normal.   Musculoskeletal:         General: No swelling or deformity.   Neurological:      General: No focal deficit present.      Mental Status: She is alert.        BP 124/86 (BP Site: L Arm, BP Position: Sitting, BP Cuff Size: Large)  - Pulse 78  - Temp 36.7 ??C (98 ??F) (Temporal)  - Resp 16  - Ht 157.5 cm (5' 2)  - Wt (!) 109 kg (240 lb 4.8 oz)  - SpO2 94%  - BMI 43.95 kg/m??    BP Readings from Last 3 Encounters:   07/05/24 124/86   05/29/24 143/96   02/24/24 121/86      Wt Readings from Last 3 Encounters:   07/05/24 (!) 109 kg (240 lb 4.8 oz)   05/29/24 (!) 107.6 kg (237 lb 3.2 oz)   02/24/24 (!) 107.5 kg (237 lb)           Healthcare Maintenance    Breast Cancer Screening-  Age 66+Mammogram date: 09/02/2023  up to date,  repeat one year from that date or sooner if symptoms develop  FH Breast Cancer:Yes in two sisters. Great niece in her 43s.     Colonoscopy- Colon Cancer Screening Age 44+ -@GI  referral placed for colonoscopy today  Colonoscopy date: Not Found  FOBT/FIT date: Not Found  Sigmoidoscopy date: Not Found  FIT-DNA date: Not Found  FH  Colon cancer:No     Cervical Cancer Screening- Age 45-29 w/out HPV, 30-65 + with HPVNo results found for: INTERPGYN, SPECADGYN, HPVRES Due now, pap performed today in office  FH Cervical/Ovarian/Endometrial Cancer:No    Dexa (Age 10+ and high risk as indicated)  DEXA date: Not Found Start at 35    Lung Cancer Screening Low Dose CT (age 8-80 yearly if 20pk year history and those who quit in past 15 years as per USPSTF):Last Low Dose CT Date: Not Found-N/A      Immunization History:    Shingles (Shingrix): UTD. SHINGRIX-ZOSTER VACCINE (HZV),RECOMBINANT,ADJUVANTED(IM) 05/07/2022 08/02/2022  Pneumovax/Prevnar: Due at 65.      Tdap: Medicare patient, will need to receive at pharmacy. TdaP 02/26/2014   Flu Vaccine: Ordered today. INFLUENZA VACCINE IIV3(IM)(PF)6 MOS UP 07/05/2024     ASCVD Risk assessment:  The 10-year ASCVD risk score (Arnett DK, et al., 2019) is: 4%   Statin Indicated: Statin not indicated, will continue to monitor regularly

## 2024-07-16 MED ORDER — MORPHINE ER 15 MG TABLET,EXTENDED RELEASE
ORAL_TABLET | Freq: Every evening | ORAL | 0 refills | 30.00000 days | Status: CP
Start: 2024-07-16 — End: ?

## 2024-07-16 MED ORDER — OXYCODONE 5 MG TABLET
ORAL_TABLET | Freq: Four times a day (QID) | ORAL | 0 refills | 25.00000 days | Status: CP | PRN
Start: 2024-07-16 — End: 2024-08-15

## 2024-07-16 NOTE — Unmapped (Signed)
 Copied from CRM [id number]. Topic: Medication Refill - Medication Refill  >> Jul 16, 2024  8:59 AM Reyes RAMAN wrote:      Hi,    Patient Heidi Woodard called requesting a medication refill for the following:    ?? Medication: oxycodone   ?? [facility] [place]    ?? Medication: morphine   ?? [facility] [place]      The expected turnaround time is 3-4 business days     Thank you,  Reyes DELENA Acre  [facility]  [phone]

## 2024-07-30 NOTE — Unmapped (Unsigned)
 Heidi Woodard  53 y.o. May 28, 1971  Phone: [phone] (home)   Address: [address]  [place] [address]   MRN: [id number]  Primary MD : Lynwood Benders Gasman   [facility]     Problem List Items Addressed This Visit    None      The patient will be scheduled for a colonoscopy as an outpatient under anesthesia. The risks and benefits have been discussed with the patient and they understand, agree and wish to proceed. The patient will return to the office to discuss any pathologic findings. Otherwise, they will be given a printout of their results at the time of the procedure. The procedure was discussed with the patient including but not limited to bleeding, perforation, infection, missed lesions, splenic or liver injury, inability to complete the procedure and anesthetic risks. The prep was explained including the need to remain NPO prior to the procedure.  All questions were answered. Anticoagulants will be held prior to procedure.    Endoscopy Consult Note    Past Medical History[1]  Past Surgical History[2]  Allergies[3]  Meds:  Current Medications[4]  SocHx:   reports that she has never smoked. She has never been exposed to tobacco smoke. She has never used smokeless tobacco. She reports that she does not drink alcohol and does not use drugs.  FamHx:  She indicated that her mother is deceased. She indicated that her father is deceased. She indicated that all of her four sisters are alive. She indicated that all of her four brothers are alive. She indicated that her maternal grandmother is deceased. She indicated that her maternal grandfather is deceased. She indicated that her paternal grandmother is alive. She indicated that her paternal grandfather is deceased. She indicated that her daughter is alive. She indicated that both of her sons are alive.    Review of Systems  ROS  Special Needs:  {mks special needs assessment:97512} History of Present Illness:  Heidi Woodard is a 53 y.o. female here for evaluation for {RLC Endoscopy Procedure Llist:308272018}. {He/she (caps):30048} presents with no prior history of colonoscopy.     Last colonoscopy:{RLCLength of time:111142018}  {He/she (caps):30048} denies nausea, vomiting, abdominal pain, anemia, change in bowel habits or unintentional weight loss.     {He/she (caps):30048} denies any blood in the toilet bowl, in the stool and on the toilet paper.    {He/she (caps):30048} has no family history of colon cancer or GI malignancy.{He/she (caps):30048} has no family history of inflammatory bowel disease or polyposis syndromes.      Reasons for testing: {RLC Reason for colonoscopy:208222018}    Current symptoms: {RLC Endoscopy Complaints:408272018}     There were no vitals filed for this visit.  Physical Exam                            [1]   Past Medical History:  Diagnosis Date    Coronary artery disease     Hyperlipidemia     Hypertension     Neoplasm of shoulder     Left   [2]   Past Surgical History:  Procedure Laterality Date    APPENDECTOMY      Left knee surgery Left     SHOULDER OPEN ROTATOR CUFF REPAIR Right 05/2024   [3]   Allergies  Allergen Reactions    Amoxicillin-Pot Clavulanate Hives and Rash    Penicillins Hives    Amoxicillin  Clavulanic Acid     Penicillin V    [4]   Current Outpatient Medications:     atorvastatin  (LIPITOR) 20 MG tablet, Take 1 tablet (20 mg total) by mouth daily., Disp: 90 tablet, Rfl: 1    celecoxib  (CELEBREX ) 200 MG capsule, Take 1 capsule (200 mg total) by mouth in the morning., Disp: , Rfl:     chlorthalidone  (HYGROTON ) 25 MG tablet, TAKE 1 TABLET BY MOUTH ONCE DAILY IN THE MORNING, Disp: 90 tablet, Rfl: 2    cyclobenzaprine  (FLEXERIL ) 10 MG tablet, Take 1 tablet (10 mg total) by mouth in the morning. (Patient taking differently: Take 1 tablet (10 mg total) by mouth nightly as needed.), Disp: , Rfl:     cycloSPORINE (RESTASIS) 0.05 % ophthalmic emulsion, Administer 1 drop to both eyes every twelve (12) hours., Disp: , Rfl:     miscellaneous medical supply Misc, Check blood pressure every morning and evening, Disp: 1 each, Rfl: 0    morphine  (MS CONTIN ) 15 MG 12 hr tablet, Take 1 tablet (15 mg total) by mouth nightly., Disp: 30 tablet, Rfl: 0    naloxone  (NARCAN ) 4 mg nasal spray, 1 spray (4 mg total) into alternating nostrils once as needed for up to 1 dose. One spray in either nostril once for known/suspected opioid overdose. May repeat every 2-3 minutes in alternating nostril til EMS arrives, Disp: 1 each, Rfl: 2    olmesartan  (BENICAR ) 20 MG tablet, Take 1 tablet (20 mg total) by mouth daily., Disp: 30 tablet, Rfl: 11    ondansetron  (ZOFRAN ) 8 MG tablet, Take 1 tablet (8 mg total) by mouth every eight (8) hours as needed for nausea., Disp: 30 tablet, Rfl: 3    oxyCODONE  (ROXICODONE ) 5 MG immediate release tablet, Take 1 tablet (5 mg total) by mouth every six (6) hours as needed for pain (moderate to severe pain)., Disp: 100 tablet, Rfl: 0    therapeutic multivitamin (THERAGRAN) tablet, Take 1 tablet by mouth daily., Disp: , Rfl:     urea  (CARMOL) 10 % cream, APPLY NIGHTLY TO AFFECTED PRESSURE CALLOUS ON FOOT, Disp: 85 g, Rfl: 5    urea -alpha hydroxy acids 10-4 % Crea, APPLY NIGHTLY TO AFFECTED PRESSURE CALLOUS ON FOOT, Disp: , Rfl:

## 2024-07-31 MED ORDER — OXYCODONE 5 MG TABLET
ORAL_TABLET | Freq: Four times a day (QID) | ORAL | 0 refills | 25.00000 days | PRN
Start: 2024-07-31 — End: 2024-08-30

## 2024-07-31 NOTE — Unmapped (Signed)
 Copied from CRM #[id number]. Topic: Medication Refill - Medication Refill  >> Jul 31, 2024  9:41 AM Nanetta SAILOR wrote:      Hi,    Patient Heidi Woodard called requesting a medication refill for the following:    ?? Medication: Oxycodone  5mg    ?? Pharmacy: [id number] [name]       The expected turnaround time is 3-4 business days     Thank you,  Nanetta CHRISTELLA Seeds  [facility] Communication [facility]  [phone]

## 2024-08-02 MED ORDER — OXYCODONE 5 MG TABLET
ORAL_TABLET | Freq: Four times a day (QID) | ORAL | 0 refills | 25.00000 days | Status: CP | PRN
Start: 2024-08-02 — End: 2024-09-01

## 2024-08-03 NOTE — Unmapped (Signed)
 Copied from CRM [id number]. Topic: Medication Refill - Medication Refill  >> Aug 03, 2024  9:06 AM Gwendlyn FALCON wrote:      Erskin,    Patient Heidi Woodard called requesting a medication refill for the following:    ?? Medication: oxyCODONE  (ROXICODONE ) 5 MG immediate release tablet  ?? Pharmacy: [facility] [address], [place] - [address]  [address], [name] [address]  Phone: [phone]  Fax: [phone]          The expected turnaround time is 3-4 business days     Thank you,  Gwendlyn Janne Coup  [facility] Communication Center  [phone]

## 2024-08-09 ENCOUNTER — Other Ambulatory Visit (HOSPITAL_COMMUNITY): Payer: Self-pay | Admitting: *Deleted

## 2024-08-09 DIAGNOSIS — Z1231 Encounter for screening mammogram for malignant neoplasm of breast: Secondary | ICD-10-CM

## 2024-08-17 MED ORDER — MORPHINE ER 15 MG TABLET,EXTENDED RELEASE
ORAL_TABLET | Freq: Every evening | ORAL | 0 refills | 30.00000 days
Start: 2024-08-17 — End: ?

## 2024-08-17 NOTE — Unmapped (Signed)
 Copied from CRM [id number]. Topic: Medication Refill - Medication Refill  >> Aug 17, 2024  9:19 AM Reyes RAMAN wrote:      Hi,    Patient Heidi Woodard called requesting a medication refill for the following:    ?? Medication: morphine   ?? Pharmacy: [facility] [phone] in [place]      The expected turnaround time is 3-4 business days     Thank you,  Reyes DELENA Acre  [facility] Communication Center  [phone]

## 2024-08-17 NOTE — Telephone Encounter (Signed)
 Copied from CRM [id number]. Topic: Medication Refill - Medication Refill  >> Aug 17, 2024  9:19 AM Reyes RAMAN wrote:      Hi,    Patient Connie Kidd called requesting a medication refill for the following:    ?? Medication: morphine   ?? Pharmacy: [facility] [phone] in [place]      The expected turnaround time is 3-4 business days     Thank you,  Reyes DELENA Acre  [facility]  [phone]

## 2024-08-20 MED ORDER — MORPHINE ER 15 MG TABLET,EXTENDED RELEASE
ORAL_TABLET | Freq: Every evening | ORAL | 0 refills | 30.00000 days | Status: CP
Start: 2024-08-20 — End: ?

## 2024-08-25 ENCOUNTER — Emergency Department (HOSPITAL_COMMUNITY)
Admission: EM | Admit: 2024-08-25 | Discharge: 2024-08-25 | Disposition: A | Discharge status: Home / Self Care | Attending: Emergency Medicine | Admitting: Emergency Medicine

## 2024-08-25 ENCOUNTER — Encounter (HOSPITAL_COMMUNITY): Payer: Self-pay | Admitting: Emergency Medicine

## 2024-08-25 ENCOUNTER — Other Ambulatory Visit: Payer: Self-pay

## 2024-08-25 ENCOUNTER — Emergency Department (HOSPITAL_COMMUNITY)

## 2024-08-25 DIAGNOSIS — Z79899 Other long term (current) drug therapy: Secondary | ICD-10-CM | POA: Insufficient documentation

## 2024-08-25 DIAGNOSIS — Z7982 Long term (current) use of aspirin: Secondary | ICD-10-CM | POA: Insufficient documentation

## 2024-08-25 DIAGNOSIS — M25512 Pain in left shoulder: Secondary | ICD-10-CM | POA: Diagnosis present

## 2024-08-25 DIAGNOSIS — G8929 Other chronic pain: Secondary | ICD-10-CM | POA: Diagnosis not present

## 2024-08-25 DIAGNOSIS — I10 Essential (primary) hypertension: Secondary | ICD-10-CM | POA: Insufficient documentation

## 2024-08-25 MED ORDER — METHOCARBAMOL 500 MG PO TABS
500.0000 mg | ORAL_TABLET | Freq: Two times a day (BID) | ORAL | 0 refills | Status: DC
Start: 2024-08-25 — End: 2024-09-25

## 2024-08-25 MED ORDER — KETOROLAC TROMETHAMINE 15 MG/ML IJ SOLN
15.0000 mg | Freq: Once | INTRAMUSCULAR | Status: AC
  Administered 2024-08-25: 15 mg via INTRAMUSCULAR
  Filled 2024-08-25: qty 1

## 2024-08-25 MED ORDER — MELOXICAM 7.5 MG PO TABS: 7.5000 mg | ORAL_TABLET | Freq: Every day | ORAL | 0 refills | Status: AC

## 2024-08-25 NOTE — Discharge Instructions (Signed)
 As we discussed, your workup in the ER today was reassuring for acute findings.  X-ray imaging did not reveal any emergent cause of your symptoms.  Given that you feel better with interventions today, no further evaluation is indicated.  Ultimately you will likely need to follow-up with your orthopedist for continued evaluation and management of your chronic pain.  Please call to schedule an appointment at your earliest convenience.  In the interim, I recommend that you rest the area and take Tylenol  as needed for pain.  I have also prescribed you meloxicam  which is an anti-inflammatory agent you can use as prescribed as needed.  I have also prescribed you Robaxin which is a muscle relaxer you can take as prescribed as needed.  Do not drive or operate heavy machinery while taking this medication as it can be sedating.  Return if development of any new or worsening symptoms.

## 2024-08-25 NOTE — ED Triage Notes (Signed)
 Pt bib pov w/ c/o severe shoulder pain in the left shoulder pain since midnight. Pt reports she recently had surgery on the right shoulder after an injury from hauling wood. Pt is alert and oriented x 4. Pt reports mild to moderate intermittent pain in this shoulder x 3 months.

## 2024-08-25 NOTE — ED Provider Notes (Signed)
 [facility] AT [facility] Provider Note   CSN: [id number] Arrival date & time: 08/25/24  9075     Patient presents with: Shoulder Pain (Left, possible injury)   Connie Kidd is a 53 y.o. female.   Patient with history of hypertension presents today with complaints of left shoulder pain. Reports that she had surgery in June 2025 to repair her right shoulder and since then she has had to use her left arm/shoulder more which she feels has resulted in increased pain. Reports that her pain feels the same as before she had her right shoulder repaired. Denies any specific injury. States that she has to carry a large wheelbarrow of wood a long distance to heat her house and has done this numerous times for many years which she has been told by her orthopedist is likely the cause of her degenerative changes to her shoulder. States that she had imaging of her left shoulder a few weeks ago to determine if she needed to go back to her orthopedist for management of her left shoulder, however reports she has not heard back. States over the past few days and specifically last night her pain has worsened. She took Motrin at 3 am this morning with minimal improvement. Denies chest pain or shortness of breath. Pain is worse with direct palpation of her shoulder and with movement of the affected shoulder.    The history is provided by the patient. No language interpreter was used.  Shoulder Pain      Prior to Admission medications   Medication Sig Start Date End Date Taking? Authorizing Provider  aspirin  EC 81 MG tablet Take 1 tablet (81 mg total) by mouth 2 (two) times daily. To prevent blood clots for 30 days after surgery. 04/10/24   Gawne, Meghan M, PA-C  baclofen  (LIORESAL ) 10 MG tablet Take 1 tablet (10 mg total) by mouth every 8 (eight) hours as needed for muscle spasms. DO NOT TAKE AT THE SAME EXACT MOMENT YOU TAKE A NARCOTIC! MUST BE SPACED OUT BY AT LEAST 2  HOURS 04/10/24 04/10/25  Gawne, Meghan M, PA-C  chlorthalidone (HYGROTON) 25 MG tablet Take 25 mg by mouth daily.    [provider]  HYDROcodone -acetaminophen  (NORCO) 10-325 MG tablet Take 1 tablet by mouth every 6 (six) hours as needed for severe pain (pain score 7-10). after shoulder surgery that is not controlled with your normal daily dose of Morphine and Oxycodone  for chronic pain 04/10/24   Gawne, Meghan M, PA-C  lisinopril (ZESTRIL) 5 MG tablet Take 5 mg by mouth daily.    [provider]  meloxicam  (MOBIC ) 15 MG tablet Take 1 tablet (15 mg total) by mouth daily as needed for pain (and inflammation). 04/10/24   Gawne, Meghan M, PA-C  ondansetron  (ZOFRAN -ODT) 4 MG disintegrating tablet Take 1 tablet (4 mg total) by mouth every 8 (eight) hours as needed for nausea or vomiting. 04/10/24   Gawne, Meghan M, PA-C  rosuvastatin (CRESTOR) 20 MG tablet Take 20 mg by mouth daily.    [provider]    Allergies: Penicillins and Augmentin [amoxicillin-pot clavulanate]    Review of Systems  Musculoskeletal:  Positive for arthralgias.  All other systems reviewed and are negative.   Updated Vital Signs BP 134/68   Pulse 98   Temp 98 F (36.7 C) (Oral)   Resp 16   Ht 5' 2 (1.575 m)   Wt 108.9 kg   SpO2 93%   BMI 43.90  kg/m   Physical Exam Vitals and nursing note reviewed.  Constitutional:      General: She is not in acute distress.    Appearance: Normal appearance. She is normal weight. She is not ill-appearing, toxic-appearing or diaphoretic.  HENT:     Head: Normocephalic and atraumatic.  Cardiovascular:     Rate and Rhythm: Normal rate and regular rhythm.     Heart sounds: Normal heart sounds.  Pulmonary:     Effort: Pulmonary effort is normal. No respiratory distress.     Breath sounds: Normal breath sounds.  Abdominal:     General: Abdomen is flat.     Palpations: Abdomen is soft.     Tenderness: There is no abdominal tenderness.  Musculoskeletal:         General: Normal range of motion.     Cervical back: Normal range of motion.     Comments: TTP noted to palpation of the humeral head and surrounding shoulder joint. No erythema, warmth, crepitus, or deformity. Active ROM very limited due to pain. Able to passively lift above 90 degrees, however patient reports pain with any movements above 90 degrees. Good distal pulses and sensation  Skin:    General: Skin is warm and dry.  Neurological:     General: No focal deficit present.     Mental Status: She is alert.  Psychiatric:        Mood and Affect: Mood normal.        Behavior: Behavior normal.     (all labs ordered are listed, but only abnormal results are displayed) Labs Reviewed - No data to display  EKG: None  Radiology: No results found.   Procedures   Medications Ordered in the ED  ketorolac (TORADOL) 15 MG/ML injection 15 mg (15 mg Intramuscular Given 08/25/24 1020)                                    Medical Decision Making Amount and/or Complexity of Data Reviewed Radiology: ordered.  Risk Prescription drug management.   This patient is a 53 y.o. female  who presents to the ED for concern of left shoulder pain.   Differential diagnoses prior to evaluation: The emergent differential diagnosis includes, but is not limited to,  trauma, overuse, muscle strain . This is not an exhaustive differential.   Past Medical History / Co-morbidities / Social History:  has a past medical history of Arthritis and Hypertension.  Additional history: Chart reviewed. Pertinent results include: following PDMP, patient received 30 15 mg tablets of morphine 5 days ago  Additionally, patient had operative management of a right rotator cuff tear, bursitis, bicep tendinitis, adhesive capsulitis with Dr. Beverley on 04/10/2024  Physical Exam: Physical exam performed. The pertinent findings include:   TTP noted to palpation of the humeral head and surrounding shoulder joint. No  erythema, warmth, crepitus, or deformity. Active ROM very limited due to pain. Able to passively lift above 90 degrees, however patient reports pain with any movements above 90 degrees. Good distal pulses and sensation  Imaging studies: X-ray imaging ordered and obtained of the patients left shoulder and has resulted and reveals  No acute osseous abnormality of the shoulder.   I have personally reviewed and interpreted this imaging and agree with radiology interpretation.   Medications: I ordered medication including toradol for pain.  I have reviewed the patients home medicines and have made adjustments as needed.  Disposition: After consideration of the diagnostic results and the patients response to treatment, I feel that emergency department workup does not suggest an emergent condition requiring admission or immediate intervention beyond what has been performed at this time. The plan is: Discharge with close outpatient orthopedic follow-up and return precautions.  Patient's workup is benign, after Toradol injection patient feels better and ready to go home.  I did consider a cardiac evaluation given left shoulder pain, however appears this is chronic is very clearly reproducible to palpation and worse with ROM of the shoulder.  Shared decision making with the patient, no indication for cardiac evaluation at this time.  She has no chest pain or shortness of breath.  Suspect patient has similar overuse injury to the left shoulder that she had of her right shoulder operatively managed back in June.  Recommend that she go back to see Dr. Beverley to determine if similar management is indicated.  Recommend NSAIDs and rest as well. Will prescribe Robaxin per patient's request.  Advised not to drive or operate machinery while taking this medication due to sedating effects. Evaluation and diagnostic testing in the emergency department does not suggest an emergent condition requiring admission or immediate  intervention beyond what has been performed at this time.  Plan for discharge with close PCP follow-up.  Patient is understanding and amenable with plan, educated on red flag symptoms that would prompt immediate return.  Patient discharged in stable condition.  Final diagnoses:  Chronic left shoulder pain    ED Discharge Orders          Ordered    meloxicam  (MOBIC ) 7.5 MG tablet  Daily        08/25/24 1126    methocarbamol (ROBAXIN) 500 MG tablet  2 times daily        08/25/24 1126          An After Visit Summary was printed and given to the patient.      Nora Lauraine DELENA DEVONNA 08/25/24 1128    Melvenia Motto, MD 08/25/24 [phone]

## 2024-08-27 MED ORDER — OXYCODONE 5 MG TABLET
ORAL_TABLET | Freq: Four times a day (QID) | ORAL | 0 refills | 25.00000 days | PRN
Start: 2024-08-27 — End: 2024-09-26

## 2024-08-27 NOTE — Telephone Encounter (Signed)
 Copied from CRM #[id number]. Topic: Medication Refill - Medication Refill  >> Aug 27, 2024 12:49 PM Davied BROCKS wrote:      Hi,    Patient Heidi Woodard called requesting a medication refill for the following:    ?? Medication:   oxyCODONE  (ROXICODONE ) 5 MG immediate release tablet      ?? [phone] [place], [place] - [address]  [address] [place] [address]  Phone: [phone] Fax: [phone]              The expected turnaround time is 3-4 business days     Thank you,  Davied ONEIDA Filter  [facility] Cancer Communication Center  [phone]

## 2024-08-28 MED ORDER — OXYCODONE 5 MG TABLET
ORAL_TABLET | Freq: Four times a day (QID) | ORAL | 0 refills | 25.00000 days | Status: CP | PRN
Start: 2024-08-28 — End: 2024-09-27

## 2024-08-30 NOTE — Telephone Encounter (Signed)
 Returned call to Ms. Wence to make aware that the medication that was requested was sent into the pharmacy on file on 10/28.     No further needs at this time

## 2024-08-30 NOTE — Telephone Encounter (Signed)
 Copied from CRM #[id number]. Topic: Medication Refill - Medication Refill  >> Aug 30, 2024 12:26 PM Almeda NOVAK wrote:      Hi,    Patient Heidi Woodard called requesting a medication refill for the following:    ?? Medication:   oxyCODONE  (ROXICODONE ) 5 MG     ?? Pharmacy: [facility] [address], [place] - [address]  [address], [name] [address]  Phone: [phone]  Fax: [phone]          The expected turnaround time is 3-4 business days     Thank you,  Almeda GORMAN Avena  [facility]  [phone]

## 2024-09-05 ENCOUNTER — Encounter (HOSPITAL_COMMUNITY): Payer: Self-pay

## 2024-09-05 ENCOUNTER — Ambulatory Visit (HOSPITAL_COMMUNITY)
Admission: RE | Admit: 2024-09-05 | Discharge: 2024-09-05 | Disposition: A | Discharge status: Home / Self Care | Source: Ambulatory Visit | Attending: *Deleted | Admitting: *Deleted

## 2024-09-05 DIAGNOSIS — Z1231 Encounter for screening mammogram for malignant neoplasm of breast: Secondary | ICD-10-CM | POA: Insufficient documentation

## 2024-09-18 MED ORDER — MORPHINE ER 15 MG TABLET,EXTENDED RELEASE
ORAL_TABLET | Freq: Every evening | ORAL | 0 refills | 30.00000 days | Status: CP
Start: 2024-09-18 — End: ?

## 2024-09-18 MED ORDER — OXYCODONE 5 MG TABLET
ORAL_TABLET | Freq: Four times a day (QID) | ORAL | 0 refills | 25.00000 days | Status: CP | PRN
Start: 2024-09-18 — End: 2024-10-18

## 2024-09-18 NOTE — Telephone Encounter (Signed)
 Copied from CRM #[id number]. Topic: Medication Refill - Medication Refill  >> Sep 18, 2024 11:22 AM Jeoffrey F wrote:      Hi,    Patient Heidi Woodard called requesting a medication refill for the following:    ?? Medication: oxyCODONE  (ROXICODONE ) 5 MG immediate release tablet  ?? Pharmacy: [facility] [address], [place] - [address]  [address] [place] [address]  Phone: [phone] Fax: [phone]        ?? Medication: morphine  (MS CONTIN ) 15 MG 12 hr tablet  ?? Pharmacy: [facility] [address] [place] - [address]  [address] [name] [address]  Phone: [phone] Fax: [phone]            The expected turnaround time is 3-4 business days     Thank you,  Jeoffrey LITTIE Presser  [facility]  [phone]

## 2024-09-18 NOTE — H&P (Signed)
 PREOPERATIVE H&P  Chief Complaint: LEFT SHOULDER ROTATOR CUFF TEAR, BICIPITAL TENDONITIS  HPI: Connie Kidd is a 53 y.o. female who presents with a diagnosis of LEFT SHOULDER ROTATOR CUFF TEAR, BICIPITAL TENDONITIS. Symptoms are rated as moderate to severe, and have been worsening.  This is significantly impairing activities of daily living.  She has elected for surgical management.   Past Medical History:  Diagnosis Date   Arthritis    Hypertension    Past Surgical History:  Procedure Laterality Date   APPENDECTOMY     BICEPT TENODESIS Right 04/10/2024   Procedure: TENODESIS, BICEPS;  Surgeon: Beverley Evalene BIRCH, MD;  Location: [facility];  Service: Orthopedics;  Laterality: Right;   POSTERIOR LUMBAR FUSION 2 WITH HARDWARE REMOVAL Right 04/10/2024   Procedure: ARTHROSCOPY, SHOULDER WITH DEBRIDEMENT;  Surgeon: Beverley Evalene BIRCH, MD;  Location: [facility];  Service: Orthopedics;  Laterality: Right;   REPLACEMENT TOTAL KNEE Left    2022   SHOULDER ARTHROSCOPY WITH ROTATOR CUFF REPAIR Right 04/10/2024   Procedure: ARTHROSCOPY, SHOULDER, WITH ROTATOR CUFF REPAIR;  Surgeon: Beverley Evalene BIRCH, MD;  Location: [facility];  Service: Orthopedics;  Laterality: Right;   SUBACROMIAL DECOMPRESSION Right 04/10/2024   Procedure: DECOMPRESSION, SUBACROMIAL SPACE;  Surgeon: Beverley Evalene BIRCH, MD;  Location:  SURGERY CENTER;  Service: Orthopedics;  Laterality: Right;   Social History   Socioeconomic History   Marital status: Single    Spouse name: Not on file   Number of children: Not on file   Years of education: Not on file   Highest education level: Not on file  Occupational History   Not on file  Tobacco Use   Smoking status: Never   Smokeless tobacco: Never  Substance and Sexual Activity   Alcohol use: Never   Drug use: Never   Sexual activity: Not on file  Other Topics Concern   Not on file  Social History Narrative   Not on  file   Social Drivers of Health   Financial Resource Strain: Low Risk (02/11/2023)   Received from [facility]   Overall Financial Resource Strain (CARDIA)    Difficulty of Paying Living Expenses: Not very hard  Food Insecurity: No Food Insecurity (07/05/2024)   Received from [facility]   Hunger Vital Sign    Within the past 12 months, you worried that your food would run out before you got the money to buy more.: Never true    Within the past 12 months, the food you bought just didn't last and you didn't have money to get more.: Never true  Transportation Needs: No Transportation Needs (07/05/2024)   Received from [facility]   PRAPARE - Transportation    Lack of Transportation (Medical): No    Lack of Transportation (Non-Medical): No  Physical Activity: Not on file  Stress: No Stress Concern Present (10/24/2023)   Received from [facility] - Occupational Stress Questionnaire    Feeling of Stress : Not at all  Social Connections: Unknown (03/16/2022)   Received from [facility]   Social Network    Social Network: Not on file   No family history on file. Allergies  Allergen Reactions   Penicillins     rash   Augmentin [Amoxicillin-Pot Clavulanate] Rash   Prior to Admission medications   Medication Sig Start Date End Date Taking? Authorizing Provider  aspirin  EC 81 MG tablet Take 1 tablet (  81 mg total) by mouth 2 (two) times daily. To prevent blood clots for 30 days after surgery. 04/10/24   Gawne, Meghan M, PA-C  baclofen  (LIORESAL ) 10 MG tablet Take 1 tablet (10 mg total) by mouth every 8 (eight) hours as needed for muscle spasms. DO NOT TAKE AT THE SAME EXACT MOMENT YOU TAKE A NARCOTIC! MUST BE SPACED OUT BY AT LEAST 2 HOURS 04/10/24 04/10/25  Gawne, Meghan M, PA-C  chlorthalidone (HYGROTON) 25 MG tablet Take 25 mg by mouth daily.    [provider]  HYDROcodone -acetaminophen  (NORCO) 10-325 MG tablet Take 1 tablet  by mouth every 6 (six) hours as needed for severe pain (pain score 7-10). after shoulder surgery that is not controlled with your normal daily dose of Morphine and Oxycodone  for chronic pain 04/10/24   Gawne, Meghan M, PA-C  lisinopril (ZESTRIL) 5 MG tablet Take 5 mg by mouth daily.    [provider]  meloxicam  (MOBIC ) 7.5 MG tablet Take 1 tablet (7.5 mg total) by mouth daily. 08/25/24   Smoot, Lauraine LABOR, PA-C  methocarbamol  (ROBAXIN ) 500 MG tablet Take 1 tablet (500 mg total) by mouth 2 (two) times daily. 08/25/24   Smoot, Lauraine LABOR, PA-C  ondansetron  (ZOFRAN -ODT) 4 MG disintegrating tablet Take 1 tablet (4 mg total) by mouth every 8 (eight) hours as needed for nausea or vomiting. 04/10/24   Gawne, Meghan M, PA-C  rosuvastatin (CRESTOR) 20 MG tablet Take 20 mg by mouth daily.    [provider]     Positive ROS: All other systems have been reviewed and were otherwise negative with the exception of those mentioned in the HPI and as above.  Physical Exam: General: Alert, no acute distress Cardiovascular: No pedal edema Respiratory: No cyanosis, no use of accessory musculature GI: No organomegaly, abdomen is soft and non-tender Skin: No lesions in the area of chief complaint Neurologic: Sensation intact distally Psychiatric: Patient is competent for consent with normal mood and affect Lymphatic: No axillary or cervical lymphadenopathy  MUSCULOSKELETAL: TTP shoulder, limited ROM, 3/5 strength, + cuff tests, NVI   Imaging: MRI shows moderate supraspinatus tendinosis with a small interstitial tear, moderate infraspinatus tendinosis with a small interstitial tear, moderate subscapularis tendinosis, moderate biceps tendinosis   Assessment: LEFT SHOULDER ROTATOR CUFF TEAR, BICIPITAL TENDONITIS  Plan: Plan for Procedure(s): ARTHROSCOPY, SHOULDER, WITH ROTATOR CUFF REPAIR DECOMPRESSION, SUBACROMIAL SPACE TENODESIS, BICEPS  The risks benefits and alternatives were discussed with  the patient including but not limited to the risks of nonoperative treatment, versus surgical intervention including infection, bleeding, nerve injury,  blood clots, cardiopulmonary complications, morbidity, mortality, among others, and they were willing to proceed.   Weightbearing: NWB LUE Orthopedic devices: sling Showering: POD 3 Dressing: reinforce PRN Medicines: ASA, Oxy, Tylenol , Mobic , Baclofen , Zofran   Discharge: home Follow up: 10/05/24 at 9:30am    Gerard CHRISTELLA Large, PA-C Office [phone] 09/18/2024 9:59 AM

## 2024-09-20 ENCOUNTER — Other Ambulatory Visit: Payer: Self-pay

## 2024-09-20 ENCOUNTER — Encounter (HOSPITAL_BASED_OUTPATIENT_CLINIC_OR_DEPARTMENT_OTHER)
Admission: RE | Admit: 2024-09-20 | Discharge: 2024-09-20 | Disposition: A | Discharge status: Home / Self Care | Source: Ambulatory Visit | Attending: Orthopedic Surgery | Admitting: Orthopedic Surgery

## 2024-09-20 ENCOUNTER — Encounter (HOSPITAL_BASED_OUTPATIENT_CLINIC_OR_DEPARTMENT_OTHER): Payer: Self-pay | Admitting: Orthopedic Surgery

## 2024-09-20 DIAGNOSIS — Z01812 Encounter for preprocedural laboratory examination: Secondary | ICD-10-CM | POA: Diagnosis not present

## 2024-09-20 DIAGNOSIS — Z01818 Encounter for other preprocedural examination: Secondary | ICD-10-CM | POA: Diagnosis present

## 2024-09-20 LAB — BASIC METABOLIC PANEL WITH GFR
Anion gap: 13 (ref 5–15)
BUN: 18 mg/dL (ref 6–20)
CO2: 26 mmol/L (ref 22–32)
Calcium: 9.4 mg/dL (ref 8.9–10.3)
Chloride: 100 mmol/L (ref 98–111)
Creatinine, Ser: 1.23 mg/dL — ABNORMAL HIGH (ref 0.44–1.00)
GFR, Estimated: 52 mL/min — ABNORMAL LOW (ref >60)
Glucose, Bld: 108 mg/dL — ABNORMAL HIGH (ref 70–99)
Potassium: 3.5 mmol/L (ref 3.5–5.1)
Sodium: 139 mmol/L (ref 135–145)

## 2024-09-24 NOTE — Anesthesia Preprocedure Evaluation (Signed)
 Anesthesia Evaluation  Patient identified by MRN, date of birth, ID band Patient awake    Reviewed: Allergy & Precautions, NPO status , Patient's Chart, lab work & pertinent test results  History of Anesthesia Complications Negative for: history of anesthetic complications  Airway Mallampati: II  TM Distance: >3 FB Neck ROM: Full   Comment: Previous grade I view with MAC 3, easy mask Dental  (+) Dental Advisory Given, Missing,    Pulmonary neg pulmonary ROS   Pulmonary exam normal breath sounds clear to auscultation       Cardiovascular hypertension (chlorthalidone, lisinopril), Pt. on medications (-) angina (-) Past MI, (-) Cardiac Stents and (-) CABG (-) dysrhythmias  Rhythm:Regular Rate:Normal  HLD   Neuro/Psych negative neurological ROS     GI/Hepatic negative GI ROS, Neg liver ROS,,,  Endo/Other  neg diabetes  Class 3 obesity  Renal/GU negative Renal ROS     Musculoskeletal  (+) Arthritis ,    Abdominal  (+) + obese  Peds  Hematology negative hematology ROS (+)   Anesthesia Other Findings   Reproductive/Obstetrics                              Anesthesia Physical Anesthesia Plan  ASA: 3  Anesthesia Plan: General   Post-op Pain Management: Tylenol  PO (pre-op)* and Regional block*   Induction: Intravenous  PONV Risk Score and Plan: 3 and Ondansetron , Dexamethasone , Treatment may vary due to age or medical condition and Midazolam   Airway Management Planned: Oral ETT  Additional Equipment:   Intra-op Plan:   Post-operative Plan: Extubation in OR  Informed Consent: I have reviewed the patients History and Physical, chart, labs and discussed the procedure including the risks, benefits and alternatives for the proposed anesthesia with the patient or authorized representative who has indicated his/her understanding and acceptance.     Dental advisory given  Plan Discussed  with: CRNA and Anesthesiologist  Anesthesia Plan Comments: (Discussed potential risks of nerve blocks including, but not limited to, infection, bleeding, nerve damage, seizures, pneumothorax, respiratory depression, and potential failure of the block. Alternatives to nerve blocks discussed. All questions answered.  Risks of general anesthesia discussed including, but not limited to, sore throat, hoarse voice, chipped/damaged teeth, injury to vocal cords, nausea and vomiting, allergic reactions, lung infection, heart attack, stroke, and death. All questions answered. )         Anesthesia Quick Evaluation

## 2024-09-25 ENCOUNTER — Other Ambulatory Visit: Payer: Self-pay

## 2024-09-25 ENCOUNTER — Ambulatory Visit (HOSPITAL_BASED_OUTPATIENT_CLINIC_OR_DEPARTMENT_OTHER): Payer: Self-pay | Admitting: Anesthesiology

## 2024-09-25 ENCOUNTER — Ambulatory Visit (HOSPITAL_BASED_OUTPATIENT_CLINIC_OR_DEPARTMENT_OTHER)
Admission: RE | Admit: 2024-09-25 | Discharge: 2024-09-25 | Disposition: A | Discharge status: Home / Self Care | Attending: Orthopedic Surgery | Admitting: Orthopedic Surgery

## 2024-09-25 ENCOUNTER — Encounter (HOSPITAL_BASED_OUTPATIENT_CLINIC_OR_DEPARTMENT_OTHER)
Admission: RE | Disposition: A | Discharge status: Home / Self Care | Payer: Self-pay | Source: Home / Self Care | Attending: Orthopedic Surgery

## 2024-09-25 ENCOUNTER — Encounter (HOSPITAL_BASED_OUTPATIENT_CLINIC_OR_DEPARTMENT_OTHER): Payer: Self-pay | Admitting: Orthopedic Surgery

## 2024-09-25 DIAGNOSIS — E669 Obesity, unspecified: Secondary | ICD-10-CM | POA: Diagnosis not present

## 2024-09-25 DIAGNOSIS — I10 Essential (primary) hypertension: Secondary | ICD-10-CM | POA: Diagnosis not present

## 2024-09-25 DIAGNOSIS — Z791 Long term (current) use of non-steroidal anti-inflammatories (NSAID): Secondary | ICD-10-CM | POA: Diagnosis not present

## 2024-09-25 DIAGNOSIS — Z6841 Body Mass Index (BMI) 40.0 and over, adult: Secondary | ICD-10-CM | POA: Insufficient documentation

## 2024-09-25 DIAGNOSIS — M75102 Unspecified rotator cuff tear or rupture of left shoulder, not specified as traumatic: Secondary | ICD-10-CM | POA: Diagnosis present

## 2024-09-25 DIAGNOSIS — Z7982 Long term (current) use of aspirin: Secondary | ICD-10-CM | POA: Diagnosis not present

## 2024-09-25 DIAGNOSIS — M19012 Primary osteoarthritis, left shoulder: Secondary | ICD-10-CM | POA: Insufficient documentation

## 2024-09-25 DIAGNOSIS — M7542 Impingement syndrome of left shoulder: Secondary | ICD-10-CM | POA: Insufficient documentation

## 2024-09-25 DIAGNOSIS — E66813 Obesity, class 3: Secondary | ICD-10-CM | POA: Insufficient documentation

## 2024-09-25 DIAGNOSIS — M7522 Bicipital tendinitis, left shoulder: Secondary | ICD-10-CM | POA: Diagnosis not present

## 2024-09-25 DIAGNOSIS — Z79899 Other long term (current) drug therapy: Secondary | ICD-10-CM | POA: Insufficient documentation

## 2024-09-25 DIAGNOSIS — Z01818 Encounter for other preprocedural examination: Secondary | ICD-10-CM

## 2024-09-25 DIAGNOSIS — E785 Hyperlipidemia, unspecified: Secondary | ICD-10-CM | POA: Insufficient documentation

## 2024-09-25 HISTORY — PX: SHOULDER ARTHROSCOPY WITH ROTATOR CUFF REPAIR: SHX5685

## 2024-09-25 HISTORY — PX: BICEPT TENODESIS: SHX5116

## 2024-09-25 HISTORY — PX: SUBACROMIAL DECOMPRESSION: SHX5174

## 2024-09-25 HISTORY — PX: RESECTION DISTAL CLAVICAL: SHX5053

## 2024-09-25 SURGERY — ARTHROSCOPY, SHOULDER, WITH ROTATOR CUFF REPAIR
Anesthesia: General | Site: Shoulder | Laterality: Left

## 2024-09-25 MED ORDER — OXYCODONE HCL 5 MG PO TABS
5.0000 mg | ORAL_TABLET | Freq: Once | ORAL | Status: AC | PRN
  Administered 2024-09-25: 5 mg via ORAL

## 2024-09-25 MED ORDER — PHENYLEPHRINE HCL-NACL 20-0.9 MG/250ML-% IV SOLN
INTRAVENOUS | Status: DC | PRN
  Administered 2024-09-25: 25 ug/min via INTRAVENOUS

## 2024-09-25 MED ORDER — ONDANSETRON 4 MG PO TBDP: 4.0000 mg | ORAL_TABLET | Freq: Three times a day (TID) | ORAL | 0 refills | Status: AC | PRN

## 2024-09-25 MED ORDER — LIDOCAINE 2% (20 MG/ML) 5 ML SYRINGE
INTRAMUSCULAR | Status: AC
  Filled 2024-09-25: qty 5

## 2024-09-25 MED ORDER — SUGAMMADEX SODIUM 200 MG/2ML IV SOLN
INTRAVENOUS | Status: DC | PRN
  Administered 2024-09-25: 100 mg via INTRAVENOUS
  Administered 2024-09-25: 200 mg via INTRAVENOUS

## 2024-09-25 MED ORDER — AMISULPRIDE (ANTIEMETIC) 5 MG/2ML IV SOLN: 10.0000 mg | Freq: Once | INTRAVENOUS | Status: DC | PRN

## 2024-09-25 MED ORDER — OXYCODONE HCL 5 MG/5ML PO SOLN: 5.0000 mg | Freq: Once | ORAL | Status: AC | PRN

## 2024-09-25 MED ORDER — CEFAZOLIN SODIUM-DEXTROSE 2-4 GM/100ML-% IV SOLN
2.0000 g | INTRAVENOUS | Status: AC
  Administered 2024-09-25: 2 g via INTRAVENOUS

## 2024-09-25 MED ORDER — HYDROCODONE-ACETAMINOPHEN 10-325 MG PO TABS: 1.0000 | ORAL_TABLET | Freq: Four times a day (QID) | ORAL | 0 refills | Status: AC | PRN

## 2024-09-25 MED ORDER — LIDOCAINE 2% (20 MG/ML) 5 ML SYRINGE
INTRAMUSCULAR | Status: AC
Start: 2024-09-25 — End: 2024-09-25
  Filled 2024-09-25: qty 10

## 2024-09-25 MED ORDER — PROPOFOL 10 MG/ML IV BOLUS
INTRAVENOUS | Status: DC | PRN
  Administered 2024-09-25: 50 mg via INTRAVENOUS
  Administered 2024-09-25: 150 mg via INTRAVENOUS

## 2024-09-25 MED ORDER — FENTANYL CITRATE (PF) 100 MCG/2ML IJ SOLN
INTRAMUSCULAR | Status: AC
  Filled 2024-09-25: qty 2

## 2024-09-25 MED ORDER — ACETAMINOPHEN 500 MG PO TABS
1000.0000 mg | ORAL_TABLET | Freq: Once | ORAL | Status: AC
  Administered 2024-09-25: 1000 mg via ORAL

## 2024-09-25 MED ORDER — LACTATED RINGERS IV SOLN: INTRAVENOUS | Status: DC | PRN

## 2024-09-25 MED ORDER — SODIUM CHLORIDE 0.9 % IR SOLN
Status: DC | PRN
Start: 2024-09-25 — End: 2024-09-25
  Administered 2024-09-25 (×6): 3000 mL

## 2024-09-25 MED ORDER — MIDAZOLAM HCL 2 MG/2ML IJ SOLN
INTRAMUSCULAR | Status: AC
  Filled 2024-09-25: qty 2

## 2024-09-25 MED ORDER — FENTANYL CITRATE (PF) 100 MCG/2ML IJ SOLN
25.0000 ug | INTRAMUSCULAR | Status: DC | PRN
  Administered 2024-09-25: 50 ug via INTRAVENOUS

## 2024-09-25 MED ORDER — ASPIRIN 81 MG PO TBEC: 81.0000 mg | DELAYED_RELEASE_TABLET | Freq: Two times a day (BID) | ORAL | 0 refills | Status: AC

## 2024-09-25 MED ORDER — ONDANSETRON HCL 4 MG/2ML IJ SOLN
INTRAMUSCULAR | Status: DC | PRN
  Administered 2024-09-25: 4 mg via INTRAVENOUS

## 2024-09-25 MED ORDER — PHENYLEPHRINE HCL (PRESSORS) 10 MG/ML IV SOLN
INTRAVENOUS | Status: DC | PRN
  Administered 2024-09-25 (×2): 80 ug via INTRAVENOUS
  Administered 2024-09-25: 160 ug via INTRAVENOUS
  Administered 2024-09-25: 80 ug via INTRAVENOUS

## 2024-09-25 MED ORDER — FENTANYL CITRATE (PF) 100 MCG/2ML IJ SOLN
50.0000 ug | Freq: Once | INTRAMUSCULAR | Status: AC
  Administered 2024-09-25: 50 ug via INTRAVENOUS

## 2024-09-25 MED ORDER — MELOXICAM 15 MG PO TABS: 15.0000 mg | ORAL_TABLET | Freq: Every day | ORAL | 0 refills | Status: AC | PRN

## 2024-09-25 MED ORDER — BUPIVACAINE HCL (PF) 0.5 % IJ SOLN
INTRAMUSCULAR | Status: DC | PRN
  Administered 2024-09-25: 10 mL via PERINEURAL

## 2024-09-25 MED ORDER — BUPIVACAINE LIPOSOME 1.3 % IJ SUSP
INTRAMUSCULAR | Status: DC | PRN
  Administered 2024-09-25: 10 mL via PERINEURAL

## 2024-09-25 MED ORDER — LIDOCAINE HCL (CARDIAC) PF 100 MG/5ML IV SOSY
PREFILLED_SYRINGE | INTRAVENOUS | Status: DC | PRN
Start: 2024-09-25 — End: 2024-09-25
  Administered 2024-09-25: 100 mg via INTRAVENOUS

## 2024-09-25 MED ORDER — FENTANYL CITRATE (PF) 100 MCG/2ML IJ SOLN
INTRAMUSCULAR | Status: DC | PRN
  Administered 2024-09-25: 50 ug via INTRAVENOUS

## 2024-09-25 MED ORDER — LACTATED RINGERS IV SOLN: INTRAVENOUS | Status: DC

## 2024-09-25 MED ORDER — CEFAZOLIN SODIUM-DEXTROSE 2-4 GM/100ML-% IV SOLN
INTRAVENOUS | Status: AC
  Filled 2024-09-25: qty 100

## 2024-09-25 MED ORDER — BACLOFEN 10 MG PO TABS: 10.0000 mg | ORAL_TABLET | Freq: Three times a day (TID) | ORAL | 0 refills | Status: AC | PRN

## 2024-09-25 MED ORDER — ONDANSETRON HCL 4 MG/2ML IJ SOLN
INTRAMUSCULAR | Status: AC
  Filled 2024-09-25: qty 2

## 2024-09-25 MED ORDER — ROCURONIUM BROMIDE 100 MG/10ML IV SOLN
INTRAVENOUS | Status: DC | PRN
  Administered 2024-09-25: 60 mg via INTRAVENOUS

## 2024-09-25 MED ORDER — PROPOFOL 10 MG/ML IV BOLUS
INTRAVENOUS | Status: AC
  Filled 2024-09-25: qty 20

## 2024-09-25 MED ORDER — DEXAMETHASONE SODIUM PHOSPHATE 4 MG/ML IJ SOLN
INTRAMUSCULAR | Status: DC | PRN
Start: 2024-09-25 — End: 2024-09-25
  Administered 2024-09-25: 10 mg via INTRAVENOUS

## 2024-09-25 MED ORDER — ACETAMINOPHEN 500 MG PO TABS
ORAL_TABLET | ORAL | Status: AC
Start: 2024-09-25 — End: 2024-09-25
  Filled 2024-09-25: qty 2

## 2024-09-25 MED ORDER — OXYCODONE HCL 5 MG PO TABS
ORAL_TABLET | ORAL | Status: AC
  Filled 2024-09-25: qty 1

## 2024-09-25 MED ORDER — POVIDONE-IODINE 10 % EX SWAB
2.0000 | Freq: Once | CUTANEOUS | Status: AC
  Administered 2024-09-25: 2 via TOPICAL

## 2024-09-25 MED ORDER — MIDAZOLAM HCL (PF) 2 MG/2ML IJ SOLN
1.0000 mg | Freq: Once | INTRAMUSCULAR | Status: AC
  Administered 2024-09-25: 1 mg via INTRAVENOUS

## 2024-09-25 SURGICAL SUPPLY — 44 items
ANCHOR SUT BIO SW 4.75X19.1 (Anchor) IMPLANT
ANCHOR SUT FBRTK 2.6 SP1.7 TAP (Anchor) IMPLANT
BLADE EXCALIBUR 4.0X13 (MISCELLANEOUS) IMPLANT
BURR OVAL 8 FLU 5.0X13 (MISCELLANEOUS) ×2 IMPLANT
CANNULA 5.75X71 LONG (CANNULA) IMPLANT
CANNULA TWIST IN 8.25X7CM (CANNULA) IMPLANT
CHLORAPREP W/TINT 26 (MISCELLANEOUS) ×2 IMPLANT
DISSECTOR 3.8MM X 13CM (MISCELLANEOUS) ×2 IMPLANT
DRAPE IMP U-DRAPE 54X76 (DRAPES) ×2 IMPLANT
DRAPE INCISE IOBAN 66X45 STRL (DRAPES) ×2 IMPLANT
DRAPE POUCH INSTRU U-SHP 10X18 (DRAPES) ×2 IMPLANT
DRAPE SHOULDER BEACH CHAIR (DRAPES) ×2 IMPLANT
DRAPE U-SHAPE 47X51 STRL (DRAPES) ×2 IMPLANT
DRSG EMULSION OIL 3X3 NADH (GAUZE/BANDAGES/DRESSINGS) ×2 IMPLANT
GAUZE PAD ABD 8X10 STRL (GAUZE/BANDAGES/DRESSINGS) ×2 IMPLANT
GAUZE SPONGE 4X4 12PLY STRL (GAUZE/BANDAGES/DRESSINGS) ×4 IMPLANT
GLOVE BIO SURGEON STRL SZ7.5 (GLOVE) ×2 IMPLANT
GLOVE BIOGEL PI IND STRL 7.0 (GLOVE) ×4 IMPLANT
GLOVE BIOGEL PI IND STRL 8 (GLOVE) ×2 IMPLANT
GLOVE SURG SYN 7.0 PF PI (GLOVE) ×2 IMPLANT
GOWN STRL REUS W/ TWL LRG LVL3 (GOWN DISPOSABLE) ×2 IMPLANT
GOWN STRL REUS W/ TWL XL LVL3 (GOWN DISPOSABLE) ×2 IMPLANT
GOWN STRL REUS W/TWL XL LVL3 (GOWN DISPOSABLE) ×2 IMPLANT
KIT PUSHLOCK 2.9 HIP (KITS) IMPLANT
MANIFOLD NEPTUNE II (INSTRUMENTS) ×2 IMPLANT
NDL HD SCORPION MEGA LOADER (NEEDLE) IMPLANT
PACK ARTHROSCOPY DSU (CUSTOM PROCEDURE TRAY) ×2 IMPLANT
PACK BASIN DAY SURGERY FS (CUSTOM PROCEDURE TRAY) ×2 IMPLANT
RESTRAINT HEAD UNIVERSAL NS (MISCELLANEOUS) ×2 IMPLANT
SLEEVE SCD COMPRESS KNEE MED (STOCKING) ×2 IMPLANT
SLING ARM FOAM STRAP LRG (SOFTGOODS) IMPLANT
SOLN 0.9% NACL POUR BTL 1000ML (IV SOLUTION) IMPLANT
SOLN STERILE WATER BTL 1000 ML (IV SOLUTION) ×2 IMPLANT
SUT ETHILON 3 0 PS 1 (SUTURE) ×2 IMPLANT
SUT MNCRL AB 4-0 PS2 18 (SUTURE) IMPLANT
SUT TIGER TAPE 7 IN WHITE (SUTURE) IMPLANT
SUTURE FIBERWR #2 38 T-5 BLUE (SUTURE) IMPLANT
SUTURE TAPE TIGERLINK 1.3MM BL (SUTURE) IMPLANT
SYSTEM IMPL TENODESIS LNT 2.9 (Orthopedic Implant) IMPLANT
TAPE FIBER 2MM 7IN #2 BLUE (SUTURE) IMPLANT
TOWEL GREEN STERILE FF (TOWEL DISPOSABLE) ×2 IMPLANT
TUBE CONNECTING 20X1/4 (TUBING) ×4 IMPLANT
TUBING ARTHROSCOPY IRRIG 16FT (MISCELLANEOUS) ×2 IMPLANT
WAND ABLATOR APOLLO I90 (BUR) ×2 IMPLANT

## 2024-09-25 NOTE — Anesthesia Postprocedure Evaluation (Signed)
 Anesthesia Post Note  Patient: Connie Kidd  Procedure(s) Performed: LEFT SHOULDER ARTHROSCOPY WITH EXTENSIVE DEBRIDEMENT AND ROTATOR CUFF REPAIR (Left: Shoulder) DECOMPRESSION, SUBACROMIAL SPACE LEFT SHOULDER (Left: Shoulder) TENODESIS, BICEPS LEFT SHOULDER (Left: Shoulder) DISTAL CLAVICLE EXCISION LEFT SHOULDER (Shoulder)     Patient location during evaluation: PACU Anesthesia Type: General Level of consciousness: awake Pain management: pain level controlled Vital Signs Assessment: post-procedure vital signs reviewed and stable Respiratory status: spontaneous breathing, nonlabored ventilation and respiratory function stable Cardiovascular status: blood pressure returned to baseline and stable Postop Assessment: no apparent nausea or vomiting Anesthetic complications: no   No notable events documented.  Last Vitals:  Vitals:   09/25/24 1130 09/25/24 1146  BP: (!) 123/90 133/86  Pulse: 79 95  Resp: 12 16  Temp:  (!) 36.1 C  SpO2: 96% 96%    Last Pain:  Vitals:   09/25/24 1146  TempSrc:   PainSc: 4                  Delon Aisha Arch

## 2024-09-25 NOTE — Interval H&P Note (Signed)
 History and Physical Interval Note:  09/25/2024 8:18 AM  Connie Kidd  has presented today for surgery, with the diagnosis of LEFT SHOULDER ROTATOR CUFF TEAR, BICIPITAL TENDONITIS.  The various methods of treatment have been discussed with the patient and family. After consideration of risks, benefits and other options for treatment, the patient has consented to  Procedure(s): ARTHROSCOPY, SHOULDER, WITH ROTATOR CUFF REPAIR (Left) DECOMPRESSION, SUBACROMIAL SPACE (Left) TENODESIS, BICEPS (Left) as a surgical intervention.  The patient's history has been reviewed, patient examined, no change in status, stable for surgery.  I have reviewed the patient's chart and labs.  Questions were answered to the patient's satisfaction.     [name]

## 2024-09-25 NOTE — Anesthesia Procedure Notes (Addendum)
 Procedure Name: Intubation Date/Time: 09/25/2024 9:22 AM  Performed by: Lenard Lacks, CRNAPre-anesthesia Checklist: Patient identified, Emergency Drugs available, Suction available and Patient being monitored Patient Re-evaluated:Patient Re-evaluated prior to induction Oxygen Delivery Method: Circle system utilized Preoxygenation: Pre-oxygenation with 100% oxygen Induction Type: IV induction Ventilation: Mask ventilation without difficulty Laryngoscope Size: Miller and 2 Grade View: Grade I Tube type: Oral Tube size: 7.0 mm Number of attempts: 1 Airway Equipment and Method: Stylet Placement Confirmation: ETT inserted through vocal cords under direct vision, positive ETCO2 and breath sounds checked- equal and bilateral Secured at: 22 cm Tube secured with: Tape Dental Injury: Teeth and Oropharynx as per pre-operative assessment

## 2024-09-25 NOTE — Discharge Instructions (Addendum)
 No tylenol  until 2:00 p.m.  POST-OPERATIVE OPIOID TAPER INSTRUCTIONS: It is important to wean off of your opioid medication as soon as possible. If you do not need pain medication after your surgery it is ok to stop day one. Opioids include: Codeine, Hydrocodone (Norco, Vicodin), Oxycodone (Percocet, oxycontin ) and hydromorphone amongst others.  Long term and even short term use of opiods can cause: Increased pain response Dependence Constipation Depression Respiratory depression And more.  Withdrawal symptoms can include Flu like symptoms Nausea, vomiting And more Techniques to manage these symptoms Hydrate well Eat regular healthy meals Stay active Use relaxation techniques(deep breathing, meditating, yoga) Do Not substitute Alcohol to help with tapering If you have been on opioids for less than two weeks and do not have pain than it is ok to stop all together.  Plan to wean off of opioids This plan should start within one week post op of your joint replacement. Maintain the same interval or time between taking each dose and first decrease the dose.  Cut the total daily intake of opioids by one tablet each day Next start to increase the time between doses. The last dose that should be eliminated is the evening dose.   Information for Discharge Teaching: EXPAREL  (bupivacaine  liposome injectable suspension)   Pain relief is important to your recovery. The goal is to control your pain so you can move easier and return to your normal activities as soon as possible after your procedure. Your physician may use several types of medicines to manage pain, swelling, and more.  Your surgeon or anesthesiologist gave you EXPAREL (bupivacaine ) to help control your pain after surgery.  EXPAREL  is a local anesthetic designed to release slowly over an extended period of time to provide pain relief by numbing the tissue around the surgical site. EXPAREL  is designed to release pain medication over  time and can control pain for up to 72 hours. Depending on how you respond to EXPAREL , you may require less pain medication during your recovery. EXPAREL  can help reduce or eliminate the need for opioids during the first few days after surgery when pain relief is needed the most. EXPAREL  is not an opioid and is not addictive. It does not cause sleepiness or sedation.   Important! A teal colored band has been placed on your arm with the date, time and amount of EXPAREL  you have received. Please leave this armband in place for the full 96 hours following administration, and then you may remove the band. If you return to the hospital for any reason within 96 hours following the administration of EXPAREL , the armband provides important information that your health care providers to know, and alerts them that you have received this anesthetic.    Possible side effects of EXPAREL : Temporary loss of sensation or ability to move in the area where medication was injected. Nausea, vomiting, constipation Rarely, numbness and tingling in your mouth or lips, lightheadedness, or anxiety may occur. Call your doctor right away if you think you may be experiencing any of these sensations, or if you have other questions regarding possible side effects.  Follow all other discharge instructions given to you by your surgeon or nurse. Eat a healthy diet and drink plenty of water or other fluids. Post Anesthesia Home Care Instructions  Activity: Get plenty of rest for the remainder of the day. A responsible individual must stay with you for 24 hours following the procedure.  For the next 24 hours, DO NOT: -Drive a car -Advertising copywriter -Drink alcoholic  beverages -Take any medication unless instructed by your physician -Make any legal decisions or sign important papers.  Meals: Start with liquid foods such as gelatin or soup. Progress to regular foods as tolerated. Avoid greasy, spicy, heavy foods. If nausea  and/or vomiting occur, drink only clear liquids until the nausea and/or vomiting subsides. Call your physician if vomiting continues.  Special Instructions/Symptoms: Your throat may feel dry or sore from the anesthesia or the breathing tube placed in your throat during surgery. If this causes discomfort, gargle with warm salt water. The discomfort should disappear within 24 hours.  If you had a scopolamine patch placed behind your ear for the management of post- operative nausea and/or vomiting:  1. The medication in the patch is effective for 72 hours, after which it should be removed.  Wrap patch in a tissue and discard in the trash. Wash hands thoroughly with soap and water. 2. You may remove the patch earlier than 72 hours if you experience unpleasant side effects which may include dry mouth, dizziness or visual disturbances. 3. Avoid touching the patch. Wash your hands with soap and water after contact with the patch.   Regional Anesthesia Blocks  1. You may not be able to move or feel the blocked extremity after a regional anesthetic block. This may last may last from 3-48 hours after placement, but it will go away. The length of time depends on the medication injected and your individual response to the medication. As the nerves start to wake up, you may experience tingling as the movement and feeling returns to your extremity. If the numbness and inability to move your extremity has not gone away after 48 hours, please call your surgeon.   2. The extremity that is blocked will need to be protected until the numbness is gone and the strength has returned. Because you cannot feel it, you will need to take extra care to avoid injury. Because it may be weak, you may have difficulty moving it or using it. You may not know what position it is in without looking at it while the block is in effect.  3. For blocks in the legs and feet, returning to weight bearing and walking needs to be done  carefully. You will need to wait until the numbness is entirely gone and the strength has returned. You should be able to move your leg and foot normally before you try and bear weight or walk. You will need someone to be with you when you first try to ensure you do not fall and possibly risk injury.  4. Bruising and tenderness at the needle site are common side effects and will resolve in a few days.  5. Persistent numbness or new problems with movement should be communicated to the surgeon or the [facility] [phone] [facility] [phone]).

## 2024-09-25 NOTE — Progress Notes (Signed)
Assisted Dr. Jennifer Allan with left, interscalene , ultrasound guided block. Side rails up, monitors on throughout procedure. See vital signs in flow sheet. Tolerated Procedure well. 

## 2024-09-25 NOTE — Op Note (Addendum)
 09/25/2024  10:33 AM  PATIENT:  Connie Kidd    PRE-OPERATIVE DIAGNOSIS:  LEFT SHOULDER ROTATOR CUFF TEAR, BICIPITAL TENDONITIS  POST-OPERATIVE DIAGNOSIS:  Same  PROCEDURE:  LEFT SHOULDER ARTHROSCOPY WITH EXTENSIVE DEBRIDEMENT AND ROTATOR CUFF REPAIR, DECOMPRESSION, SUBACROMIAL SPACE LEFT SHOULDER, TENODESIS, BICEPS LEFT SHOULDER, DISTAL CLAVICLE EXCISION LEFT SHOULDER  SURGEON:  [name], MD  ASSISTANT: Gerard Large, PA-C, he was present and scrubbed throughout the case, critical for completion in a timely fashion, and for retraction, instrumentation, and closure.   ANESTHESIA:   General  PREOPERATIVE INDICATIONS:  Connie Kidd is a  53 y.o. female with a diagnosis of LEFT SHOULDER ROTATOR CUFF TEAR, BICIPITAL TENDONITIS who failed conservative measures and elected for surgical management.  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     BLOOD LOSS: minimal  COMPLICATIONS: none  OPERATIVE PROCEDURE:  Patient was identified in the preoperative holding area and site was marked by me He was transported to the operating theater and placed on the table in beach chair position taking care to pad all bony prominences. After a preincinduction time out anesthesia was induced. The left upper extremity was prepped and draped in normal sterile fashion and a pre-incision timeout was performed. Connie Kidd received ancef  for preoperative antibiotics.   Initially made a posterior arthroscopic portal and inserted the arthroscope into the glenohumeral joint. tour of the joint demonstrated the above operative findings  I created  an anterior portal just lateral to the coracoid under direct visualization using a spinal needle.  I performed an extensive debridement of the scarred synovial tissue and remaining structures including labrum, synovium, subacromial bursa, subdeltoid bursa   I used a combination of biter and shaver to release the biceps tendon from the superior labrum and then used the shaver to debride the superior labrum to a smooth rim. I perfromed a loop and tack biceps tenodesis  I then introduced the arthroscope into the subacromial space and brought the shaver into the anterior portal. I debrided the bursa for appropriate visualization.  I then performed a subacromial decompression using combination of the shaver ArthroCare and burr using a cutting block technique. As happy with the final elevation of the subacromial space on multiple portal views.   Next I turned my attention to the distal clavicle and through the anterior portal using the bur and shaver I was able to perform a distal clavicle excision. I then switched portals and inserted the arthroscope into the anterior portal and was happy with an appropriate resection of the distal clavicle.   I debrided the rotator cuff tear and examined its mobility. There was a roughly 8 millimeters tear and I debrided the tear here. Iperfromed a dual row repair with two anchors  I was happy with the tendon apposition and there was minimal to no dog ear.  Next I removed all arthroscopic equipment expressed all fluid and closed the portals with a nylon stitch. A sterile dressing was applied the patient was taken the PACU in stable condition.  POST OPERATIVE PLAN: The patient will be in a sling full-time and keep the dressings clean dry and intact.

## 2024-09-25 NOTE — Transfer of Care (Signed)
 Immediate Anesthesia Transfer of Care Note  Patient: Connie Kidd  Procedure(s) Performed: LEFT SHOULDER ARTHROSCOPY WITH EXTENSIVE DEBRIDEMENT AND ROTATOR CUFF REPAIR (Left: Shoulder) DECOMPRESSION, SUBACROMIAL SPACE LEFT SHOULDER (Left: Shoulder) TENODESIS, BICEPS LEFT SHOULDER (Left: Shoulder) DISTAL CLAVICLE EXCISION LEFT SHOULDER (Shoulder)  Patient Location: PACU  Anesthesia Type:General  Level of Consciousness: awake  Airway & Oxygen Therapy: Patient connected to nasal cannula oxygen  Post-op Assessment: Report given to RN and Post -op Vital signs reviewed and stable  Post vital signs: Reviewed and stable  Last Vitals:  Vitals Value Taken Time  BP 116/78 09/25/24 10:56  Temp    Pulse 80 09/25/24 11:00  Resp 12 09/25/24 11:00  SpO2 98 % 09/25/24 11:00  Vitals shown include unfiled device data.  Last Pain:  Vitals:   09/25/24 0753  TempSrc: Temporal  PainSc: 5       Patients Stated Pain Goal: 4 (09/25/24 0753)  Complications: No notable events documented.

## 2024-09-25 NOTE — Anesthesia Procedure Notes (Signed)
 Anesthesia Regional Block: Interscalene brachial plexus block   Pre-Anesthetic Checklist: , timeout performed,  Correct Patient, Correct Site, Correct Laterality,  Correct Procedure, Correct Position, site marked,  Risks and benefits discussed,  Surgical consent,  Pre-op evaluation,  At surgeon's request and post-op pain management  Laterality: Left  Prep: chloraprep       Needles:  Injection technique: Single-shot  Needle Type: Echogenic Stimulator Needle     Needle Length: 9cm  Needle Gauge: 21     Additional Needles:   Procedures:,,,, ultrasound used (permanent image in chart),,    Narrative:  Start time: 09/25/2024 8:12 AM End time: 09/25/2024 8:17 AM Injection made incrementally with aspirations every 5 mL.  Performed by: Personally  Anesthesiologist: Peggye Delon Brunswick, MD  Additional Notes: Discussed risks and benefits of nerve block including, but not limited to, prolonged and/or permanent nerve injury involving sensory and/or motor function. Monitors were applied and a time-out was performed. The nerve and associated structures were visualized under ultrasound guidance. After negative aspiration, local anesthetic was slowly injected around the nerve. There was no evidence of high pressure during the procedure. There were no paresthesias. VSS remained stable and the patient tolerated the procedure well.

## 2024-09-26 ENCOUNTER — Encounter (HOSPITAL_BASED_OUTPATIENT_CLINIC_OR_DEPARTMENT_OTHER): Payer: Self-pay | Admitting: Orthopedic Surgery

## 2024-10-10 MED ORDER — ATORVASTATIN 20 MG TABLET
ORAL_TABLET | Freq: Every day | ORAL | 1 refills | 90.00000 days | Status: CP
Start: 2024-10-10 — End: 2025-10-10

## 2024-10-10 NOTE — Telephone Encounter (Signed)
 Patient is requesting the following refill  Requested Prescriptions     Pending Prescriptions Disp Refills    atorvastatin  (LIPITOR) 20 MG tablet 90 tablet 1     Sig: Take 1 tablet (20 mg total) by mouth daily.       Recent Visits  Date Type Provider Dept   07/05/24 Office Visit Marlee Lynwood Benders, MD [facility] Family [facility]   10/24/23 Office Visit Emmitt Geofm Reusing, MD [facility] [place]   Showing recent visits within past 365 days and meeting all other requirements  Future Appointments  No visits were found meeting these conditions.  Showing future appointments within next 365 days and meeting all other requirements       Labs: Pregnancy: No results found for: PREGPOC, PREGSERUM OBG: Menstrual History:  OB History    No obstetric history on file.          No LMP recorded. Patient is postmenopausal.

## 2024-10-11 MED ORDER — OXYCODONE 5 MG TABLET
ORAL_TABLET | Freq: Four times a day (QID) | ORAL | 0 refills | 25.00000 days | PRN
Start: 2024-10-11 — End: 2024-11-10

## 2024-10-11 NOTE — Telephone Encounter (Signed)
 Copied from CRM #[id number]. Topic: Medication Refill - Medication Refill  >> Oct 11, 2024  3:36 PM Nanetta SAILOR wrote:      Hi,    Patient Mellonie Guess called requesting a medication refill for the following:    ?? Medication: OXYCODONE  5MG  and MORPHINE  15MG    ?? Pharmacy: [id number] [name] [address]       The expected turnaround time is 3-4 business days     Thank you,  Nanetta CHRISTELLA Seeds  [facility] Communication Center  [phone]

## 2024-10-15 MED ORDER — MORPHINE ER 15 MG TABLET,EXTENDED RELEASE
ORAL_TABLET | Freq: Every evening | ORAL | 0 refills | 30.00000 days
Start: 2024-10-15 — End: ?

## 2024-10-15 MED ORDER — OXYCODONE 5 MG TABLET
ORAL_TABLET | Freq: Four times a day (QID) | ORAL | 0 refills | 25.00000 days | PRN
Start: 2024-10-15 — End: 2024-11-14

## 2024-10-15 NOTE — Telephone Encounter (Signed)
 Copied from CRM #[id number]. Topic: Medication Refill - Medication Refill  >> Oct 15, 2024  9:06 AM Davied BROCKS wrote:      Hi,    Patient Heidi Woodard called requesting a medication refill for the following:    ?? Medication: morphine  (MS CONTIN ) 15 MG 12 hr tablet  ?? Pharmacy: [facility] [address] [place] - [address]  [address] [place] [address]  Phone: [phone] Fax: [phone]      ?? Medication: oxyCODONE  (ROXICODONE ) 5 MG immediate release tablet  ?? Pharmacy: [facility] [address], [place] - [address]  [address] [name] [address]  Phone: [phone] Fax: [phone]          The expected turnaround time is 3-4 business days     Thank you,  Davied ONEIDA Filter  [facility] Cancer Communication Center  [phone]

## 2024-10-17 NOTE — Telephone Encounter (Signed)
 Copied from CRM #[id number]. Topic: Medication Refill - Medication Refill  >> Oct 17, 2024 12:21 PM Almeda NOVAK wrote:      Hi,    Patient Heidi Woodard called requesting a medication refill for the following:    ?? Medication:   morphine  (MS CONTIN ) 15 MG 12 hr      ?? Pharmacy: [facility] [address] [place] - [address]  [address], [name] [address]  Phone: [phone]  Fax: [phone]         The expected turnaround time is 3-4 business days     Thank you,  Almeda GORMAN Avena  [facility] Cancer Communication Center  [phone]

## 2024-10-18 MED ORDER — MORPHINE ER 15 MG TABLET,EXTENDED RELEASE
ORAL_TABLET | Freq: Every evening | ORAL | 0 refills | 30.00000 days | Status: CP
Start: 2024-10-18 — End: ?

## 2024-10-18 NOTE — Addendum Note (Signed)
 Addended by: Debrah Granderson N on: 10/18/2024 09:35 AM     Modules accepted: Orders

## 2024-10-19 MED ORDER — MORPHINE ER 15 MG TABLET,EXTENDED RELEASE
ORAL_TABLET | Freq: Every evening | ORAL | 0 refills | 30.00000 days
Start: 2024-10-19 — End: ?

## 2024-10-19 MED ORDER — OXYCODONE 5 MG TABLET
ORAL_TABLET | Freq: Four times a day (QID) | ORAL | 0 refills | 25.00000 days | PRN
Start: 2024-10-19 — End: 2024-11-18

## 2024-10-19 NOTE — Telephone Encounter (Signed)
Done elsewhere

## 2024-11-05 MED ORDER — MORPHINE ER 15 MG TABLET,EXTENDED RELEASE
ORAL_TABLET | Freq: Every evening | ORAL | 0 refills | 30.00000 days
Start: 2024-11-05 — End: ?

## 2024-11-05 MED ORDER — OXYCODONE 5 MG TABLET
ORAL_TABLET | Freq: Four times a day (QID) | ORAL | 0 refills | 25.00000 days | PRN
Start: 2024-11-05 — End: 2024-12-05

## 2024-11-05 NOTE — Telephone Encounter (Signed)
 Copied from CRM #[id number]. Topic: Medication Refill - Medication Refill  >> Nov 05, 2024  3:55 PM Geni MATSU wrote:      Hi,    Patient Jakiera Ehler called requesting a medication refill for the following:    ?? Medication: oxyCODONE  (ROXICODONE ) 5 MG immediate release tablet  ?? Pharmacy: [facility] [address] [place] - [address]  [address], [name] [address]  Phone: [phone]  Fax: [phone]                     Medication: morphine  (MS CONTIN ) 15 MG 12 hr tablet  ?? Pharmacy: [facility] [place] [address] [place] - [address]  [address], [name] [address]  Phone: [phone]  Fax: [phone]        The expected turnaround time is 3-4 business days     Thank you,  Geni JONELLE Platt  [facility] Cancer Communication Center  [phone]

## 2024-11-06 ENCOUNTER — Encounter (HOSPITAL_BASED_OUTPATIENT_CLINIC_OR_DEPARTMENT_OTHER): Payer: Self-pay | Admitting: Orthopedic Surgery

## 2024-11-06 MED ORDER — MORPHINE ER 15 MG TABLET,EXTENDED RELEASE
ORAL_TABLET | Freq: Every evening | ORAL | 0 refills | 30.00000 days | Status: CP
Start: 2024-11-06 — End: ?

## 2024-11-06 MED ORDER — OXYCODONE 5 MG TABLET
ORAL_TABLET | Freq: Four times a day (QID) | ORAL | 0 refills | 25.00000 days | Status: CP | PRN
Start: 2024-11-06 — End: 2024-12-06

## 2024-11-06 NOTE — Telephone Encounter (Signed)
 Returned call to [name] to make aware that the medication that was requested was sent into the pharmacy on file.     No further needs at this time

## 2024-11-20 DIAGNOSIS — I10 Essential (primary) hypertension: Principal | ICD-10-CM

## 2024-11-20 MED ORDER — CHLORTHALIDONE 25 MG TABLET
ORAL_TABLET | Freq: Every morning | ORAL | 2 refills | 90.00000 days | Status: CP
Start: 2024-11-20 — End: ?

## 2024-11-20 NOTE — Telephone Encounter (Signed)
 Patient is requesting the following refill  Requested Prescriptions     Pending Prescriptions Disp Refills    chlorthalidone  (HYGROTON ) 25 MG tablet [Pharmacy Med Name: Chlorthalidone  25 MG Oral Tablet] 90 tablet 0     Sig: TAKE 1 TABLET BY MOUTH ONCE DAILY IN THE MORNING       Recent Visits  Date Type Provider Dept   07/05/24 Office Visit Marlee [place] Benders, MD [facility] [place] [facility] [place]   Showing recent visits within past 365 days and meeting all other requirements  Future Appointments  No visits were found meeting these conditions.  Showing future appointments within next 365 days and meeting all other requirements       Labs: Creatinine:   Creatinine (mg/dL)   Date Value   90/95/7974 1.03   05/28/2021 0.96    Potassium:   Potassium (mmol/L)   Date Value   07/05/2024 3.6   05/28/2021 3.9    Sodium:   Sodium (mmol/L)   Date Value   07/05/2024 140   05/28/2021 138    Vitals:   BP Readings from Last 3 Encounters:   07/05/24 124/86   05/29/24 143/96   02/24/24 121/86    and   Pulse Readings from Last 3 Encounters:   07/05/24 78   05/29/24 80   02/24/24 73

## 2024-11-21 MED ORDER — MORPHINE ER 15 MG TABLET,EXTENDED RELEASE
ORAL_TABLET | Freq: Every evening | ORAL | 0 refills | 30.00000 days | Status: CP
Start: 2024-11-21 — End: ?

## 2024-11-21 NOTE — Telephone Encounter (Signed)
 Copied from CRM #[id number]. Topic: Medication Refill - Medication Refill  >> Nov 21, 2024 10:14 AM Nanetta SAILOR wrote:      Hi,    Patient Heidi Woodard called requesting a medication refill for the following:    ?? Medication: MORPHINE  (MS CONTIN ) 15MG    ?? Pharmacy: [facility] [place]       The expected turnaround time is 3-4 business days     Thank you,  Nanetta CHRISTELLA Seeds  [facility] Cancer Communication Center  [phone]

## 2024-11-28 MED ORDER — OXYCODONE 5 MG TABLET
ORAL_TABLET | Freq: Four times a day (QID) | ORAL | 0 refills | 25.00000 days | Status: CP | PRN
Start: 2024-11-28 — End: 2024-12-28

## 2024-11-28 NOTE — Telephone Encounter (Signed)
 Copied from CRM #[id number]. Topic: Medication Refill - Medication Refill  >> Nov 28, 2024  9:19 AM Jon RAMAN wrote:      Hi,    Patient Heidi Woodard called requesting a medication refill for the following:    ?? Medication: Oxycodone  5 mg  ?? Pharmacy: [facility]      The expected turnaround time is 3-4 business days     Thank you,  Jon JONELLE Shoulder  [facility] Cancer Communication Center  [phone]

## 2024-11-30 ENCOUNTER — Encounter: Admit: 2024-11-30 | Payer: MEDICARE

## 2024-11-30 NOTE — Progress Notes (Unsigned)
 [facility] BONE AND SOFT TISSUE ONCOLOGY RETURN VISIT    Encounter Date: 11/30/2024  Patient Name: Heidi Woodard  Medical Record Number: [id number]    Referring Physician: Loris Lang Masters, MD  [address]  [facility] [address] [place],  [place] [address]    Primary Care Provider: Marlee [place] Benders, MD    {    Coding tips - Do not edit this text, it will delete upon signing of note!    Telephone visits [phone] for Physicians and APPs and [phone] for Non- Physician Clinicians)- Only use minutes on the phone to determine level of service.    Video visits [phone]) - Use either level of medical decision making just as an in-person visit OR time which includes both minutes on video and pre/post minutes to determine the level of service.      :[id number]}  The patient reports they are physically located in [place]  and is currently: at home. I conducted a audio/video visit. I spent *** minutes on the video call with the patient. I spent an additional *** minutes on pre- and post-visit activities on the date of service .       DIAGNOSIS:  No diagnosis found.      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 Continue prn topical steroids  - Continue urea  cream on callouses on feet and also along neck.   - MRI Left shoulder, 02/08/20, similar to decreased bulk of shoulder mass  - 05/20/20: held sorafenib  in anticipation of TKR on 05/26/20  - 08/04/20: MRI Left shoulder, similar size but increase in pain/swelling. Restarted sorafenib  200 mg daily.    - 12/12/20: Continues on sorafenib  200 mg daily, doing well. Improvement in desmoid associated pain, manageable with low dose opioids. HFS, HTN, nausea under control.  -02/13/21: Pain progressively worse over past few months, requiring increasing doses of opioids. MRI is stable/slightly better. However, given her pain, referred for cryoablation, Dr. Babara feels it is amenable.  - 06/2021: cryoablation to the L shoulder.  - 03/23/22: Scans show decrease in size and enhancement of desmoid tumor. Continue sorafenib  200mg  daily. Consider treatment holiday at 2 years after cryoablation (06/2023)    06/07/23: Scans show stable disease. Treatment holiday from sorafenib .     05/29/24: Last MRI with R shoulder rotator cuff tendinosis. No evidence of desmoid change.    11/30/24:  Assessment & Plan      -Possible future options if progression: resume sorafenib  200mg , Doxil, pazopanib, nirogestat.      Desmoid tumor associated pain - improved with sorafenib , then a bit worse. Had follow up visit in May for discussion about safe prescribing, adherence to dose as prescribed, inability to do any more early refills, as she had been requesting several early refills due to escalating pain. Now stable for several months, adherent to pain management agreement.     Nighttime MS contin  15mg  (#30 per month)   Oxycodone  5mg  Q4h prn, max 3 pills daily (#100 monthly)  05/29/24: Discussed weaning pain medication as able, given stable desmoid off therapy. She will try.     Dispo:  RTC 6 mos wo scan.     I personally reviewed the medical records, pathology and laboratory results and viewed the imaging. All questions were answered to the patient's apparent satisfaction and they voiced  understanding and agreement with the plan. Pt has my card and contact information and is encouraged to reach out with any further questions.    Kamariyah Timberlake, MD/MPH  Assistant Professor  Bone and Soft Tissue Oncology Program  [facility]  Pager: [phone]  Nurse Navigator: Corean Bracket RN, BSN, OCN      REASON FOR CONSULTATION:   Heidi Woodard is a 54 y.o. female who is seen in consultation at the request of Loris Lang Masters, MD for evaluation of her desmoid fibromatosis.      HISTORY OF PRESENT ILLNESS:      In 2016, pt developed shoulder pain associated with a fall. There was bruising and swelling but she attributed it to the injury. It never improved, and by summer she noted increased swelling and decreased ROM (which is limited by pain, rather than painless weakness). 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 It also affects her IADLs and ADLs such as dressing/bathing. She underwent CT of the left shoudler on 06/25/16 which showed a 16 cm mass involing the left deltoid with likely invasion of the left trapezius, triceps, and teres minor. Possible LN involvement of axillary and supraclav LN on left were present. On 07/07/2016 she saw Dr. Mardeen and ultrasound-guided biopsy was performed with pathology revealing low-grade spindle cell lesion favoring fibromatosis. He felt that surgical resection would be highly morbid. On 9/20/017, she saw Dr Tepper, who did not favor RT due to inherent risks associated with RT.      She has ongoing pain, some tingling along the backs of her hands. Pain is intermittent if her arm is at rest, happening about every other day but when it happens it is very intense. Per above it limits her ADLs although she says she is managing.     Interval history:   History of Present Illness        REVIEW OF SYSTEMS:  A comprehensive review of 12 systems was negative except for pertinent positives noted in HPI.    Past Medical History, Surgical History, Family History were reviewed personally. Any changes were updated as above.    Social History:  Lives in [place], [place]  Has a supportive boyfriend  No t/e/d  19 year old daughter    Pregnancy status: perimenopausal, not sexually active    ALLERGIES/MEDICATIONS:  Reviewed in Epic    PHYSICAL EXAM:   Gen - well appearing, in no acute distress  Eyes - conjunctivae clear, PERRL  ENT - oral mucosa clear, dentition normal  Resp - symmetric excursion, normal work of breathing  Skin - no rashes, no lesions noted on visible skin  Neuro - AOx3, EOM intact, no facial droop, speech fluent and coherent, moves all extremities without asymmetry  Psych - affect normal, mood okay  MSK - no joint swelling or effusions noted      PATHOLOGY:   Pathology was personally reviewed as described in the HPI, detailed in Epic.     LABS:  Reviewed in Epic    RADIOLOGY:  Imaging was personally viewed and interpreted as summarized in the history (see above).
